# Patient Record
Sex: Male | Born: 1945
Health system: Southern US, Community
[De-identification: ages and names within clinical notes are randomized; demographics above are authoritative.]

## PROBLEM LIST (undated history)

## (undated) DIAGNOSIS — G629 Polyneuropathy, unspecified: Secondary | ICD-10-CM

## (undated) DIAGNOSIS — E785 Hyperlipidemia, unspecified: Secondary | ICD-10-CM

## (undated) DIAGNOSIS — B181 Chronic viral hepatitis B without delta-agent: Secondary | ICD-10-CM

## (undated) DIAGNOSIS — N189 Chronic kidney disease, unspecified: Secondary | ICD-10-CM

## (undated) DIAGNOSIS — I2510 Atherosclerotic heart disease of native coronary artery without angina pectoris: Secondary | ICD-10-CM

## (undated) DIAGNOSIS — J449 Chronic obstructive pulmonary disease, unspecified: Secondary | ICD-10-CM

## (undated) DIAGNOSIS — I219 Acute myocardial infarction, unspecified: Secondary | ICD-10-CM

## (undated) DIAGNOSIS — G4733 Obstructive sleep apnea (adult) (pediatric): Secondary | ICD-10-CM

## (undated) DIAGNOSIS — E119 Type 2 diabetes mellitus without complications: Secondary | ICD-10-CM

## (undated) DIAGNOSIS — I10 Essential (primary) hypertension: Secondary | ICD-10-CM

## (undated) HISTORY — DX: Obstructive sleep apnea (adult) (pediatric): G47.33

## (undated) HISTORY — DX: Essential (primary) hypertension: I10

## (undated) HISTORY — DX: Acute myocardial infarction, unspecified: I21.9

## (undated) HISTORY — DX: Chronic viral hepatitis B without delta-agent: B18.1

## (undated) HISTORY — DX: Hyperlipidemia, unspecified: E78.5

## (undated) HISTORY — PX: ABDOMINAL AORTIC ANEURYSM REPAIR: SUR1152

## (undated) HISTORY — PX: LUNG SURGERY: SHX703

## (undated) HISTORY — DX: Atherosclerotic heart disease of native coronary artery without angina pectoris: I25.10

## (undated) HISTORY — DX: Chronic kidney disease, unspecified: N18.9

## (undated) HISTORY — DX: Polyneuropathy, unspecified: G62.9

## (undated) HISTORY — DX: Type 2 diabetes mellitus without complications: E11.9

## (undated) HISTORY — DX: Chronic obstructive pulmonary disease, unspecified: J44.9

---

## 1995-07-15 HISTORY — PX: LUMBAR DISC SURGERY: SHX700

## 2000-05-26 ENCOUNTER — Ambulatory Visit (HOSPITAL_COMMUNITY): Admission: RE | Admit: 2000-05-26 | Discharge: 2000-05-27 | Payer: Self-pay | Admitting: Interventional Cardiology

## 2000-05-27 ENCOUNTER — Encounter: Payer: Self-pay | Admitting: Interventional Cardiology

## 2000-06-24 ENCOUNTER — Encounter: Payer: Self-pay | Admitting: Thoracic Surgery

## 2000-06-26 ENCOUNTER — Encounter: Payer: Self-pay | Admitting: Thoracic Surgery

## 2000-06-26 ENCOUNTER — Inpatient Hospital Stay (HOSPITAL_COMMUNITY): Admission: RE | Admit: 2000-06-26 | Discharge: 2000-06-30 | Payer: Self-pay | Admitting: Thoracic Surgery

## 2000-06-26 ENCOUNTER — Encounter (INDEPENDENT_AMBULATORY_CARE_PROVIDER_SITE_OTHER): Payer: Self-pay

## 2000-06-27 ENCOUNTER — Encounter: Payer: Self-pay | Admitting: Thoracic Surgery

## 2000-06-28 ENCOUNTER — Encounter: Payer: Self-pay | Admitting: Thoracic Surgery

## 2000-06-29 ENCOUNTER — Encounter: Payer: Self-pay | Admitting: Thoracic Surgery

## 2000-07-12 ENCOUNTER — Emergency Department (HOSPITAL_COMMUNITY): Admission: EM | Admit: 2000-07-12 | Discharge: 2000-07-12 | Payer: Self-pay | Admitting: *Deleted

## 2000-07-21 ENCOUNTER — Encounter: Payer: Self-pay | Admitting: Thoracic Surgery

## 2000-07-21 ENCOUNTER — Encounter: Admission: RE | Admit: 2000-07-21 | Discharge: 2000-07-21 | Payer: Self-pay | Admitting: Thoracic Surgery

## 2000-09-01 ENCOUNTER — Encounter: Admission: RE | Admit: 2000-09-01 | Discharge: 2000-09-01 | Payer: Self-pay | Admitting: Thoracic Surgery

## 2000-09-01 ENCOUNTER — Encounter: Payer: Self-pay | Admitting: Thoracic Surgery

## 2000-11-04 ENCOUNTER — Encounter: Admission: RE | Admit: 2000-11-04 | Discharge: 2000-11-04 | Payer: Self-pay | Admitting: Thoracic Surgery

## 2000-11-04 ENCOUNTER — Encounter: Payer: Self-pay | Admitting: Thoracic Surgery

## 2001-03-05 ENCOUNTER — Encounter: Admission: RE | Admit: 2001-03-05 | Discharge: 2001-03-05 | Payer: Self-pay | Admitting: Thoracic Surgery

## 2001-03-05 ENCOUNTER — Encounter: Payer: Self-pay | Admitting: Thoracic Surgery

## 2007-07-09 ENCOUNTER — Inpatient Hospital Stay (HOSPITAL_COMMUNITY): Admission: EM | Admit: 2007-07-09 | Discharge: 2007-07-10 | Payer: Self-pay | Admitting: Emergency Medicine

## 2007-07-16 ENCOUNTER — Ambulatory Visit (HOSPITAL_COMMUNITY): Admission: RE | Admit: 2007-07-16 | Discharge: 2007-07-17 | Payer: Self-pay | Admitting: Interventional Cardiology

## 2007-07-17 ENCOUNTER — Emergency Department (HOSPITAL_COMMUNITY): Admission: EM | Admit: 2007-07-17 | Discharge: 2007-07-17 | Payer: Self-pay | Admitting: Emergency Medicine

## 2007-07-18 ENCOUNTER — Emergency Department (HOSPITAL_COMMUNITY): Admission: EM | Admit: 2007-07-18 | Discharge: 2007-07-18 | Payer: Self-pay | Admitting: Emergency Medicine

## 2008-07-04 ENCOUNTER — Emergency Department (HOSPITAL_COMMUNITY): Admission: EM | Admit: 2008-07-04 | Discharge: 2008-07-04 | Payer: Self-pay | Admitting: Emergency Medicine

## 2009-11-13 IMAGING — CR DG RIBS W/ CHEST 3+V*L*
3 series · 3 of 3 positions shown · non-contrast
Comparison: Chest x-ray 07/09/2007

CLINICAL DATA: Fell on ice - left chest and shoulder pain

LEFT RIBS AND CHEST - 3+ VIEW

[w chest pa]
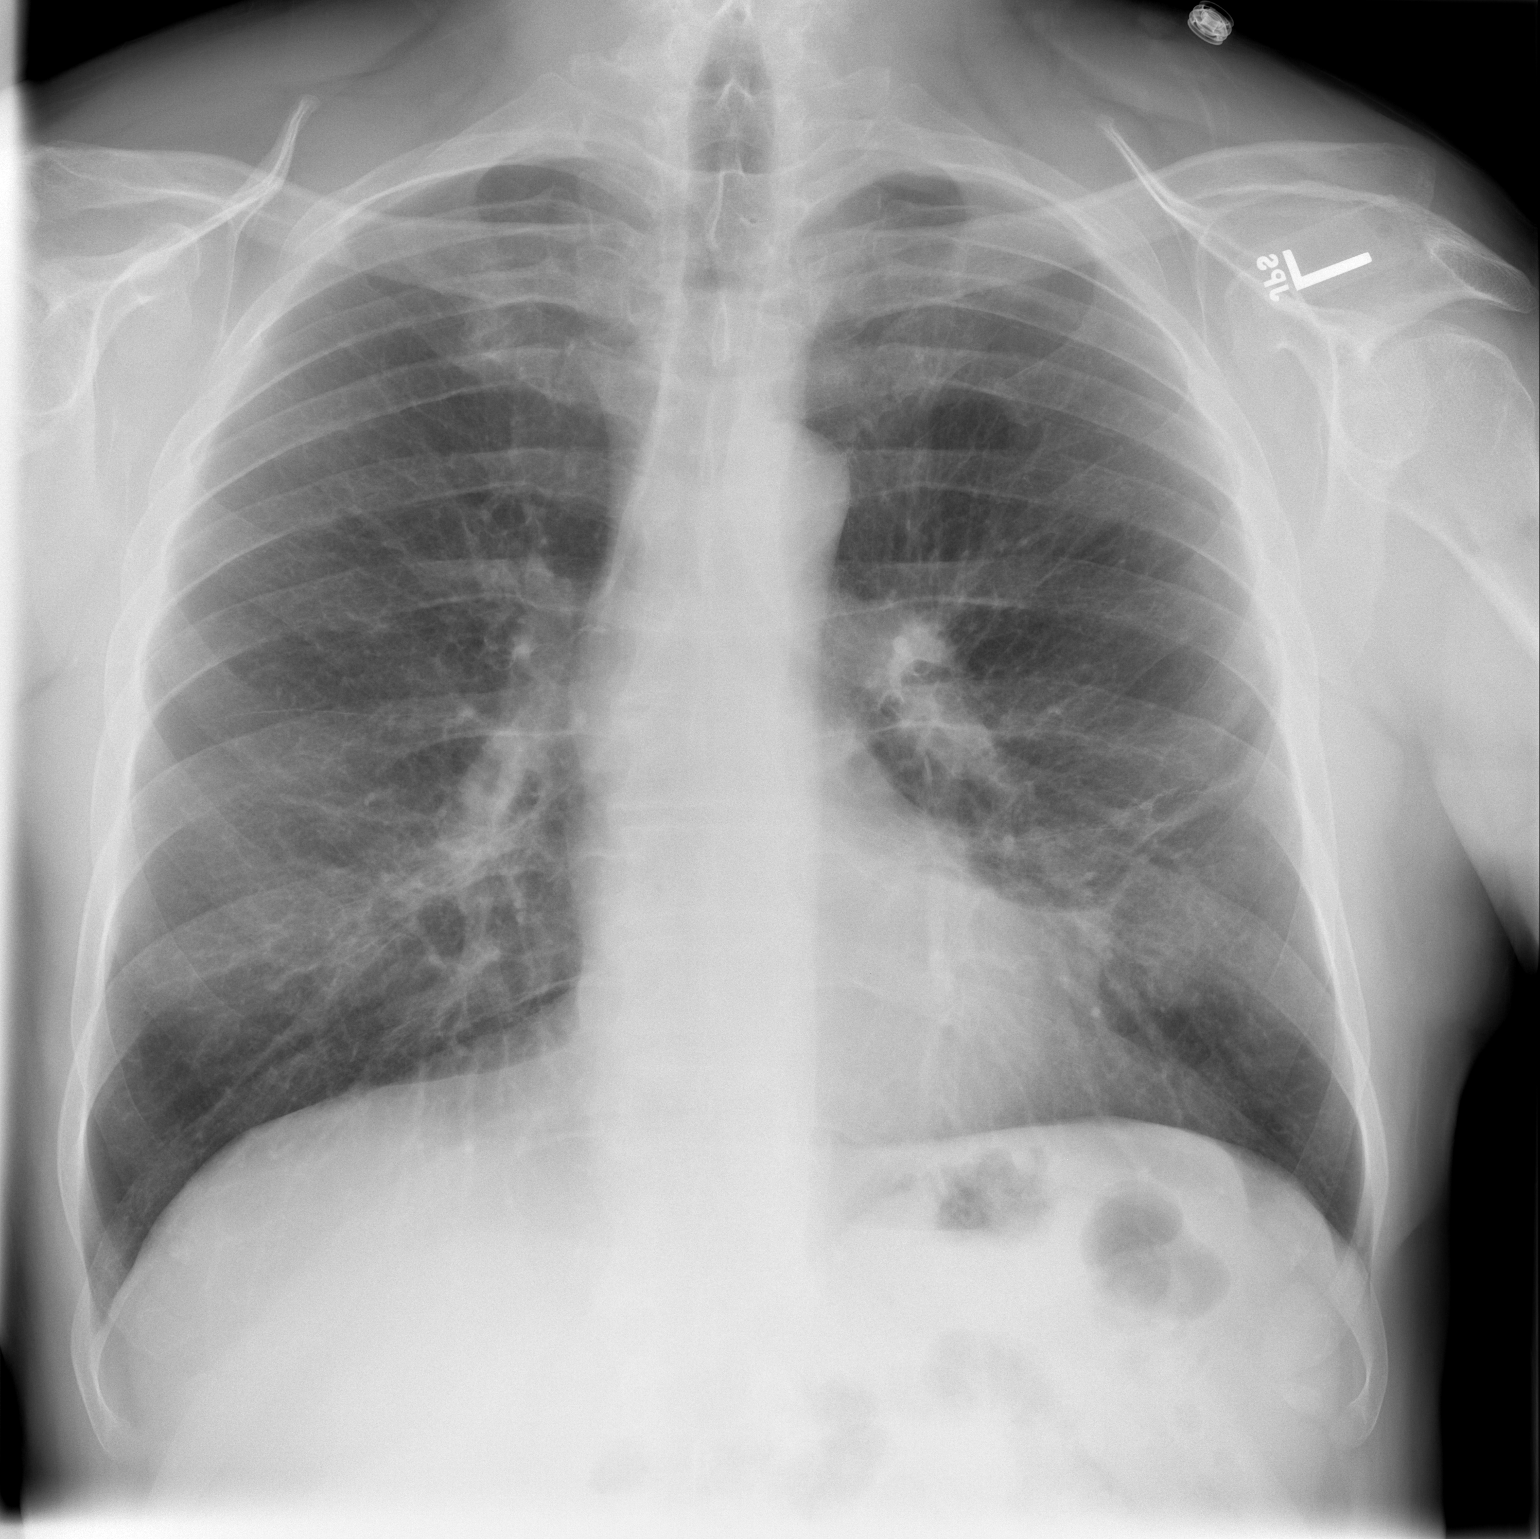

[w ribs ap/pa upper left *]
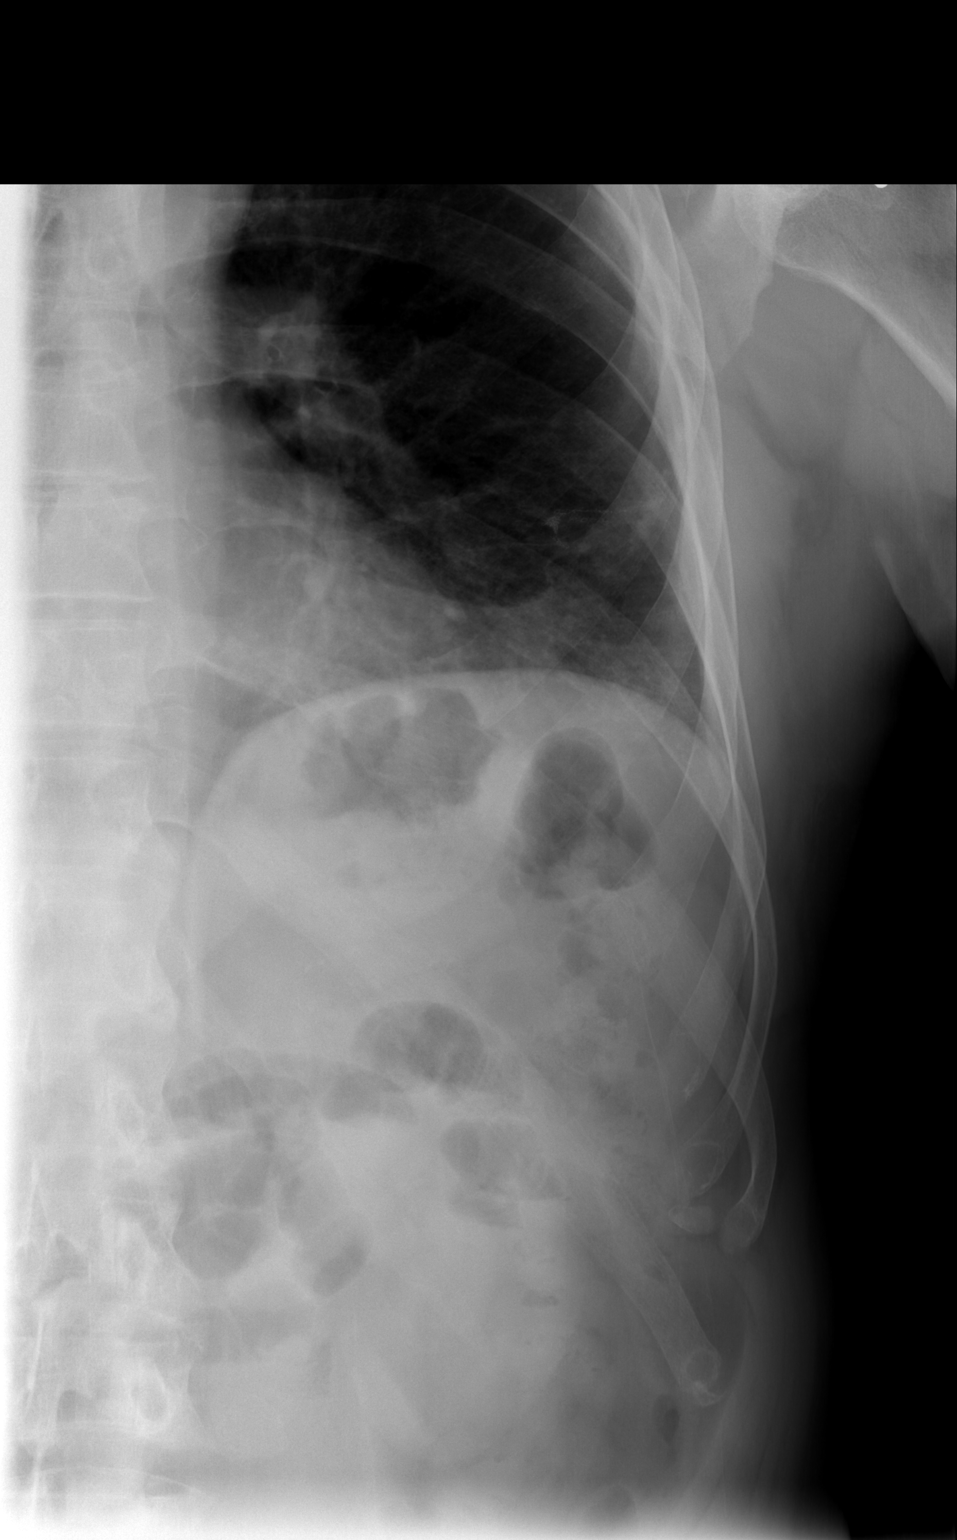

[w ribs ap/pa lower left *]
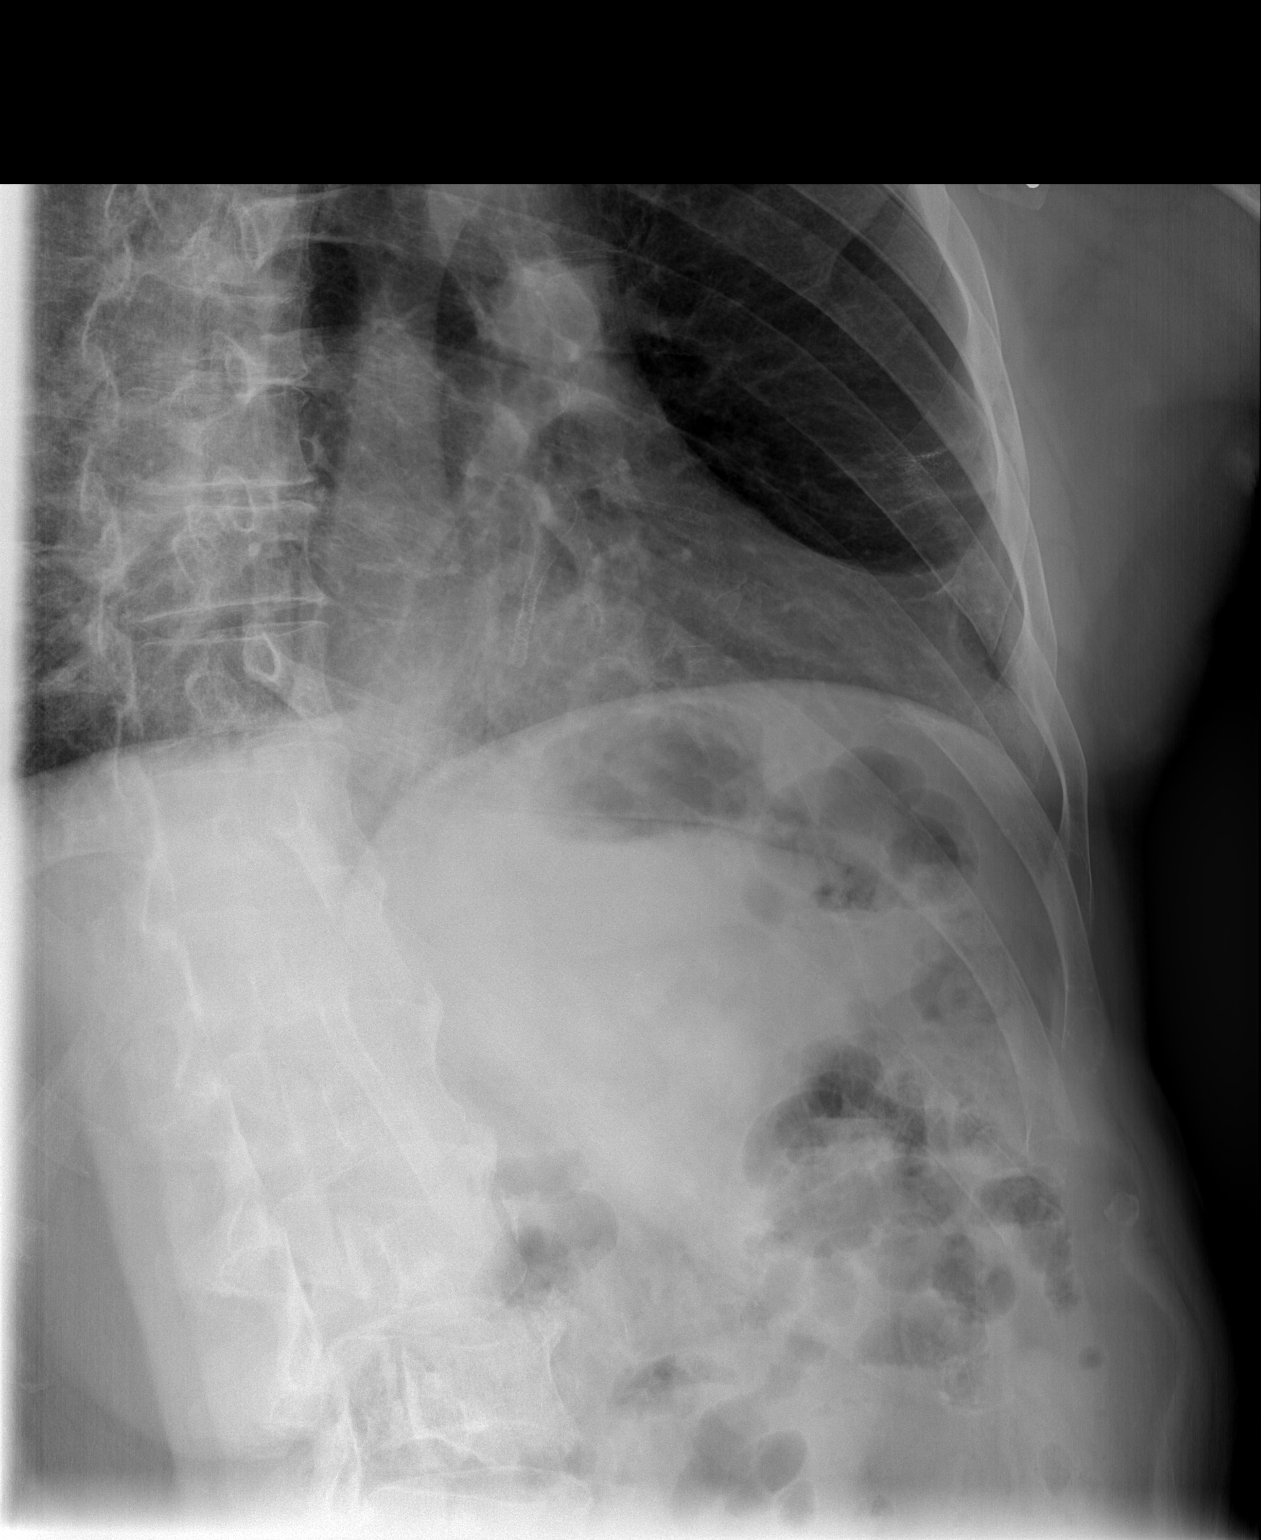

[3 of 3 positions shown; findings below may reference images not displayed]

FINDINGS: There are no visible rib fractures.  No pneumothorax or
pleural fluid.

There are chronic lingular scar like changes and rather heavy
bronchitic changes as noted previously.  Coronary arteries stents
are seen as well.
IMPRESSION: 1.  No rib fractures or acute findings.
2.  Lingular scarring and chronic bronchitic changes.
3.  Coronary arteries stents.

## 2009-11-13 IMAGING — CT CT HEAD W/O CM
1 series · 16 of 30 positions shown, 20 images · non-contrast
Comparison: None

CLINICAL DATA: Fell on ice.

CT HEAD WITHOUT CONTRAST
TECHNIQUE: Contiguous axial images were obtained from the base of
the skull through the vertex without contrast.

[Series 2: head trauma 4.8 h37s · axial · 0.51mm/px · z∈[+1338,+1498]mm · 16 of 36 slices shown, 20 images]
[im 2/36  brain]
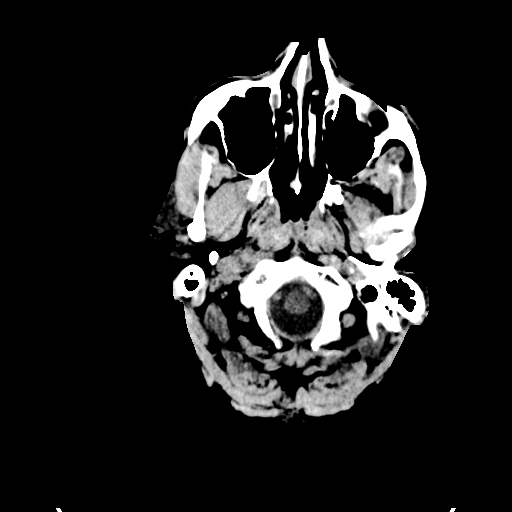
[im 2/36  bone]
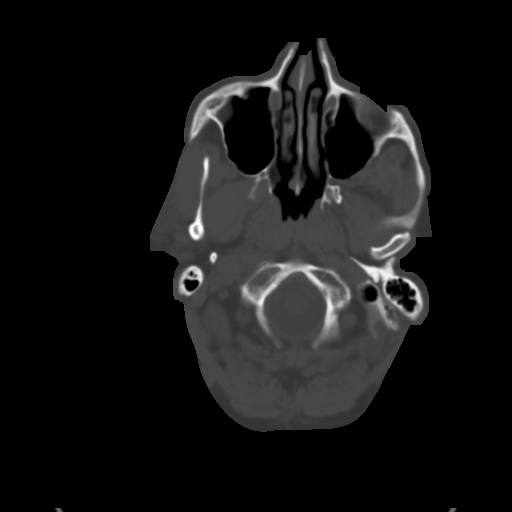
[im 4/36  brain]
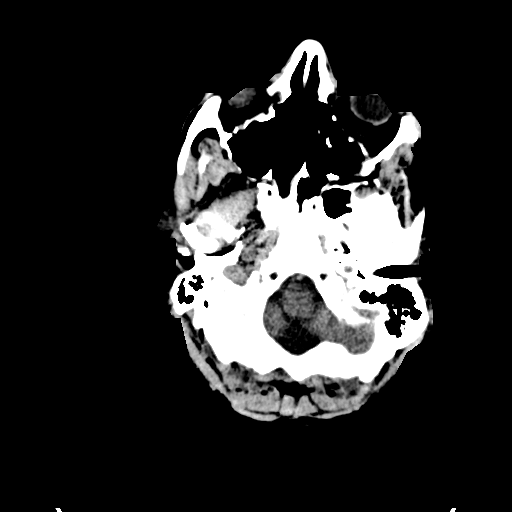
[im 7/36  brain]
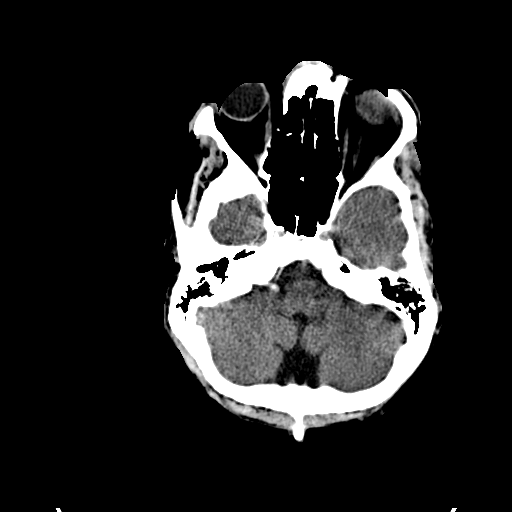
[im 9/36  brain]
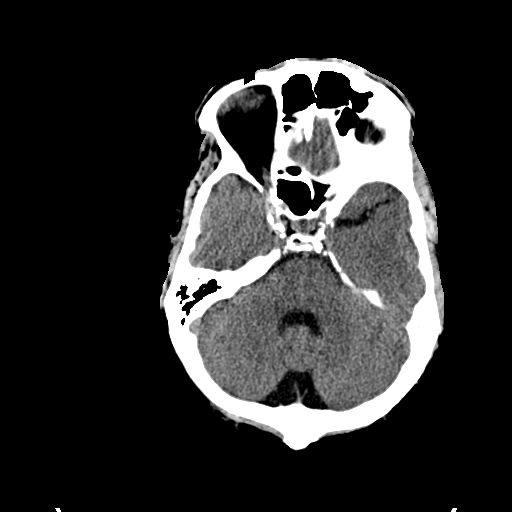
[im 10/36  brain]
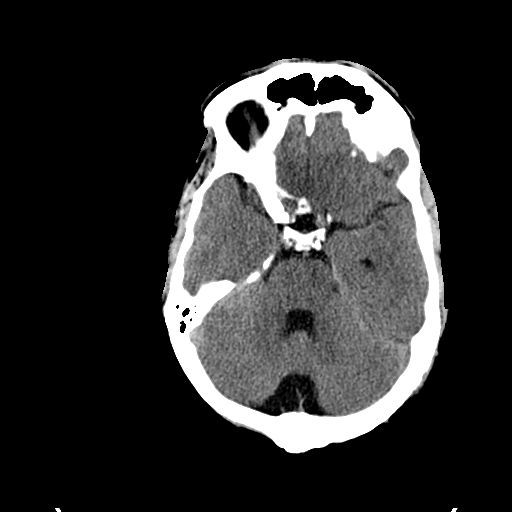
[im 10/36  bone]
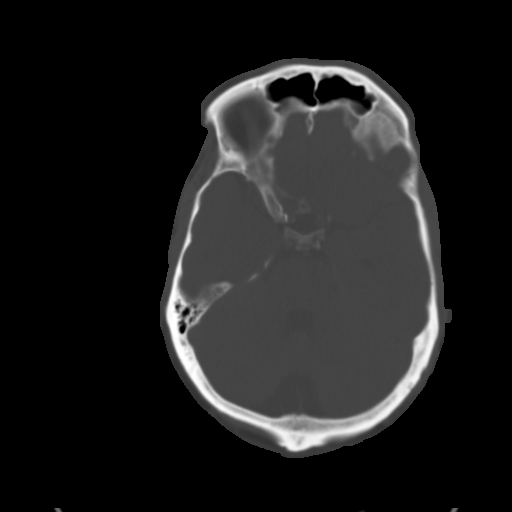
[im 13/36  brain]
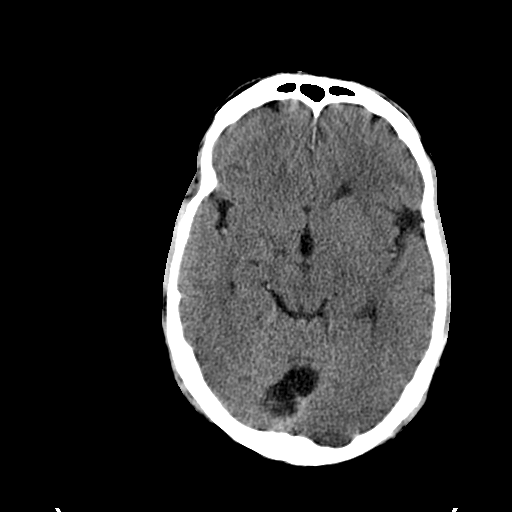
[im 15/36  brain]
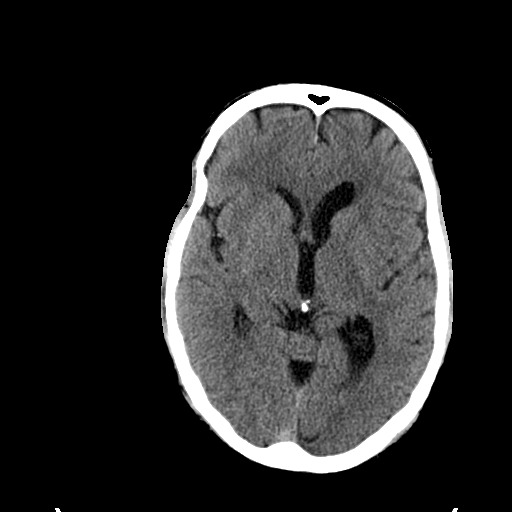
[im 17/36  brain]
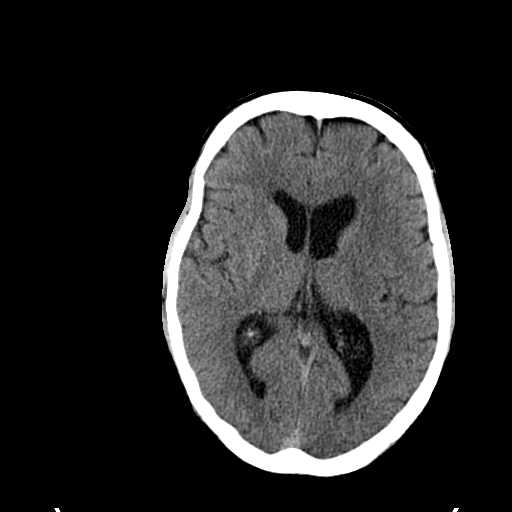
[im 19/36  brain]
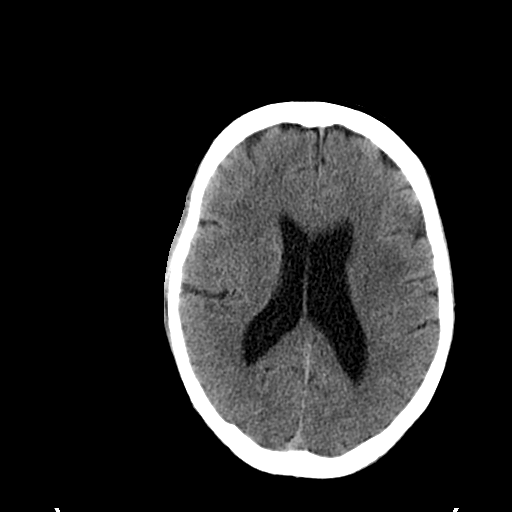
[im 19/36  bone]
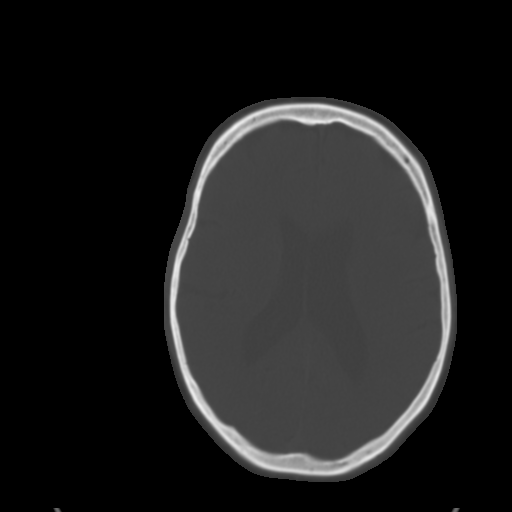
[im 21/36  brain]
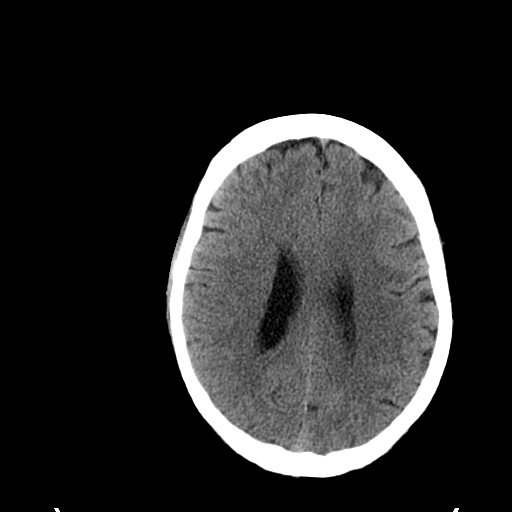
[im 23/36  brain]
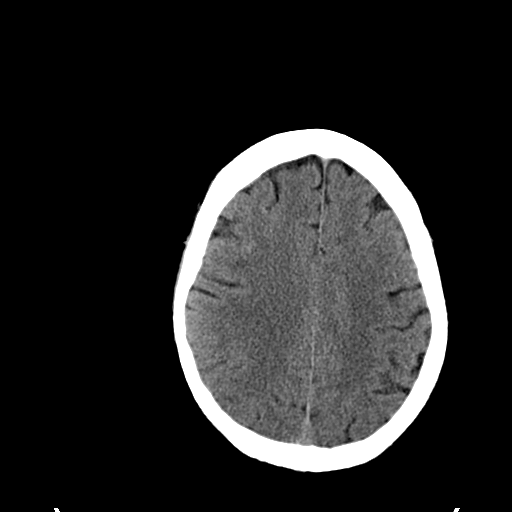
[im 26/36  brain]
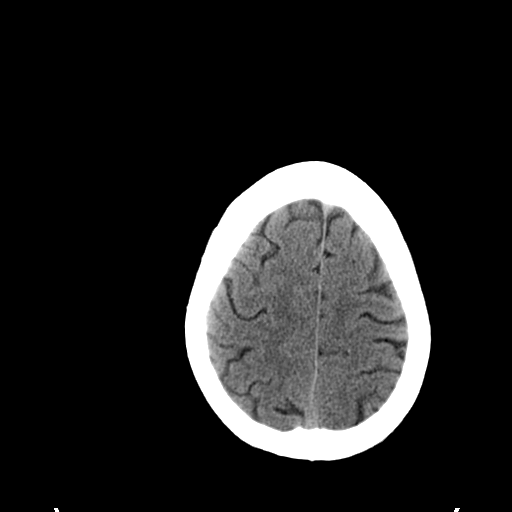
[im 27/36  brain]
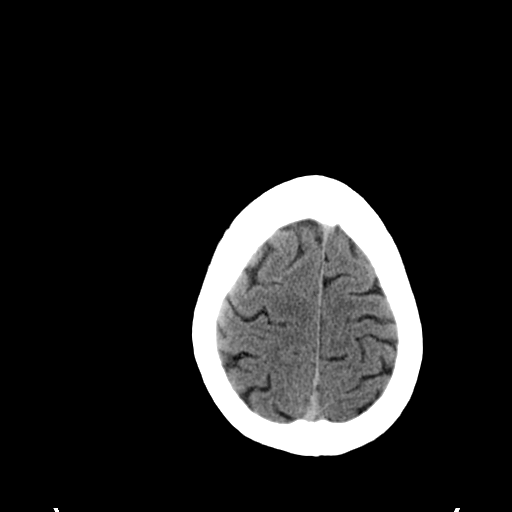
[im 27/36  bone]
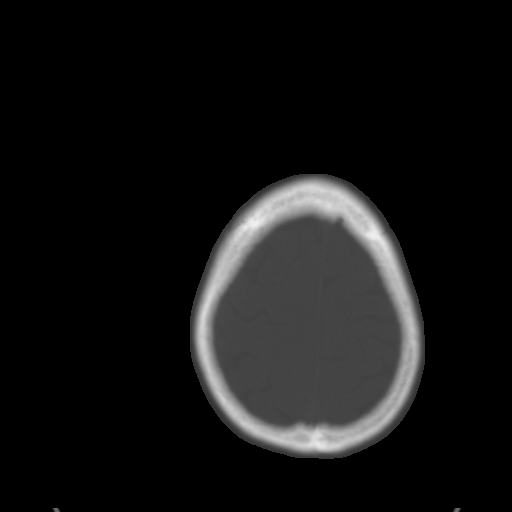
[im 29/36  brain]
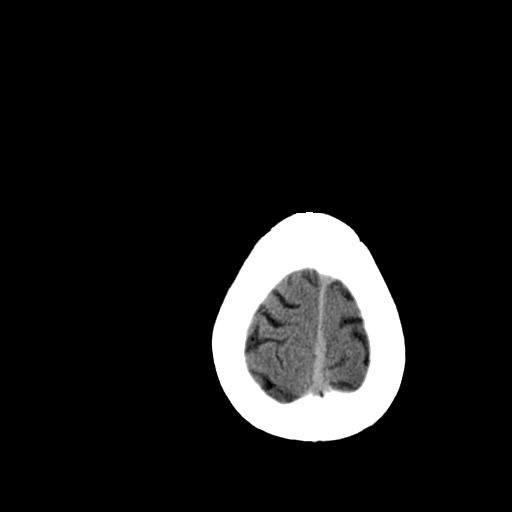
[im 32/36  brain]
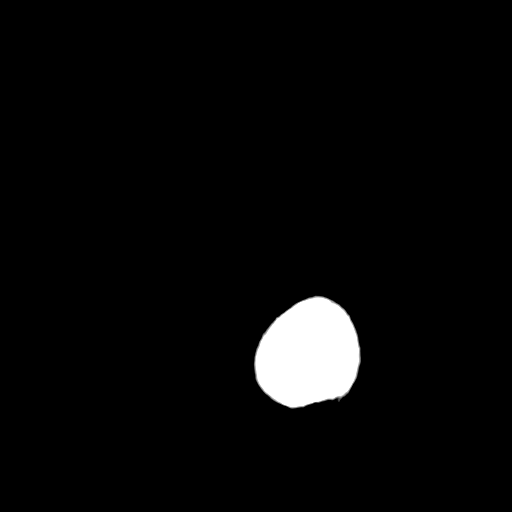
[im 34/36  brain]
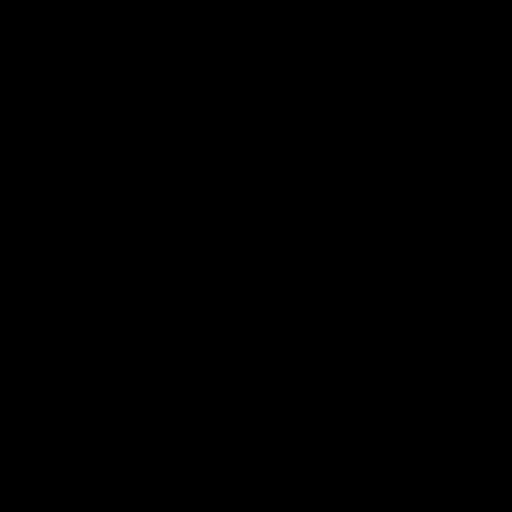

[16 of 30 positions shown; findings below may reference images not displayed]

FINDINGS: Ventricles are normal in size.  Mega cisterna magna is
noted as an incidental finding.  Negative for intracranial
hemorrhage.  There is no mass lesion.  There is patchy hypodensity
in the deep white matter bilaterally compatible with chronic
microvascular ischemia.  Negative for skull fracture.
IMPRESSION: No acute abnormality.

## 2010-04-23 ENCOUNTER — Ambulatory Visit: Payer: Self-pay | Admitting: Cardiovascular Disease

## 2010-11-26 NOTE — H&P (Signed)
NAME:  Samuel Watkins, Samuel NO.:  [id number]   MEDICAL RECORD NO.:  [id number]          PATIENT TYPE:  EMS   LOCATION:  MAJO                         FACILITY:  [facility]   PHYSICIAN:  Jake Bathe, MD      DATE OF BIRTH:  [birth year]   DATE OF ADMISSION:  07/09/2007  DATE OF DISCHARGE:                              HISTORY & PHYSICAL   PRIMARY CARDIOLOGIST:  Lyn Records, M.D.   PRIMARY CARE PHYSICIAN:  Tasia Catchings, M.D.   GASTROENTEROLOGIST:  Danise Edge, M.D.   CHIEF COMPLAINT:  Chest pain.   HISTORY OF PRESENT ILLNESS:  65 year old male with coronary artery  disease status post PCI BMS to RCA (occlusion) in 2001 with residual  moderate diffuse disease in the proximal and mid-right coronary artery,  moderate LAD disease with a mid 60 to 70% stenosis and 70% stenosis of  the first obtuse marginal branch of the circumflex artery who presents  today with worsening angina.  Since Monday, he has been noticing  increasing chest discomfort lasting approximately 5 to 10 minutes in  duration.  Occurred once while with exertion while bringing his trash in  from outside, substernal with shortness of breath,  radiation to  bilateral arms.  He had another episode during sexual activity.  He is  currently not having any rest pain. Wife was worried after another bout  of chest discomfort this morning and he promptly came over to [facility]  Emergency Department for evaluation.  Currently not complaining of any  significant chest pain, shortness of breath, nausea, and vomiting.  Has  had prior easy bruising.  Has not been on aspirin or any other  medicines.  Still continues to smoke quite heavily.   PAST MEDICAL HISTORY:  1. Coronary artery disease PCI to RCA in 2001 (PENTA BMS 3.0 x 23mm)      with moderate disease in LAD and circumflex.  2. Hepatitis B.  3. Hyperlipidemia.  4. Tobacco abuse.   ALLERGIES:  No known drug allergies.   MEDICINES:  None currently.  No  ASA. Here received aspirin, oxygen, and  IV nitroglycerin.   SOCIAL HISTORY:  He smokes one pack per day.  Occasional alcohol use.  Denies any illicit drug use.  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  Has not been taking any medicines or aspirin.   FAMILY HISTORY:  Both parents have had coronary artery disease.  No  early family history of myocardial infarction.   REVIEW OF SYSTEMS:  Easy bruising, hepatitis, arthritis, smoking.  No  syncope.  No orthopnea or PND.  Unless specified above, all other 12  review of systems negative.   PHYSICAL EXAMINATION:  VITAL SIGNS:  Temperature 97.0, blood pressure  136/83, pulse 57, respirations 18, and sating 96% on room air.  GENERAL:  Alert and oriented x3, in no acute distress, here with his  wife, lying comfortably in bed.  EYES:  Well-perfused conjunctivae.  EOMI.  No scleral icterus.  HEENT:  Moist  mucus membranes.  NECK:  Supple.  No thyromegaly.  No lymphadenopathy.  No carotid bruits.  CARDIOVASCULAR:  Bradycardiac, regular rhythm with no appreciable  murmurs, rubs, or gallops.  Normal PMI.  LUNGS:  Clear to auscultation bilaterally.  No rales.  Normal  respiratory effort.  ABDOMEN:  Soft, nontender, and normal active bowel sounds.  No bruits.  EXTREMITIES:  No clubbing, cyanosis, or edema, 2+ distal pulses, and 2+  femoral pulses bilaterally.  NEUROLOGIC:  Nonfocal and no tremors.  SKIN:  Warm, dry, and intact.  No rashes.   LABORATORY DATA:  EKG:  Sinus bradycardia, rate 54, normal intervals,  and right axis deviation.  Compared to prior ECG 2001, there was no  significant change.  There are no ST deviations.  Sodium 138, potassium  4.6, BUN 19, creatinine 1.1, glucose 84, hemoglobin 15.6, and hematocrit  46.  First set of cardiac biomarkers are negative.   Chest x-ray personally reviewed shows some scarring in the left lingula;  otherwise, unremarkable  (he has had prior lung nodules followed up in  the past with CT scanning by Dr. Theresia Lo).  Last nuclear stress test in  May 2002 showed no infarct or ischemia with a normal ejection fraction  of 61%.  Prior catheterization 2001 as described above.   ASSESSMENT AND PLAN:  A 65 year old male with coronary artery disease  status post prior percutaneous coronary intervention to right coronary  artery total occlusion with moderate disease in the left anterior  descending and circumflex, with hyperlipidemia, tobacco abuse with  unstable angina.  1. Unstable angina- Given the patient's increased frequency of angina      or chest discomfort, I will perform cardiac catheterization to      assess stent and other cardiac anatomy.  Risks and benefits of the      procedure have been explained including stroke, heart attack, and      death.  His wife was present for the discussion.  Continue aspirin.      We will place on low dose nitroglycerin drip.  No beta-blocker      currently given bradycardia.  We will check fasting lipid profile      on the morning, assess for statin use (prior hepatitis).  Continue      cyclo cardiac biomarkers.  2. Tobacco abuse - Counseled on tobacco cessation.  3. Hyperlipidemia - Check fasting lipid profile, questionable need for      statin use given prior history of hepatitis.  4. Hepatitis B - Followed by Dr. Laural Benes, GI.      Jake Bathe, MD  Electronically Signed     MCS/MEDQ  D:  07/09/2007  T:  07/09/2007  Job:  [id number]   cc:   Vikki Ports, M.D.  Danise Edge, M.D.  Lyn Records, M.D.

## 2010-11-26 NOTE — Cardiovascular Report (Signed)
Samuel Watkins, Samuel Watkins NO.:  [id number]   MEDICAL RECORD NO.:  [id number]          PATIENT TYPE:  INP   LOCATION:  3743                         FACILITY:  [facility]   PHYSICIAN:  Corky Crafts, MDDATE OF BIRTH:  January 24, 1946   DATE OF PROCEDURE:  07/09/2007  DATE OF DISCHARGE:                            CARDIAC CATHETERIZATION   PROCEDURE PERFORMED:  Percutaneous coronary intervention of the right  coronary artery.   OPERATOR:  Corky Crafts, MD   INDICATIONS:  Unstable angina.   PROCEDURE:  The diagnostic catheterization was performed by [name]  and revealed 99% RCA lesion and stent.  This was thought to be the  culprit for his symptoms.  A JR-4 guide was used.  Prowater wire was  used to cross the lesion.  A 2.5 x 15-mm Maverick was placed across the  lesion and inflated to 8 atmospheres x23 seconds and then more  proximally at 8 atmospheres x8 seconds.  Sluggish flow was seen in the  vessel.  A fetch catheter was used to try and aspirate thrombus, but  this was not successful.  A 3.0 x 32-mm, Taxus Liberte stent was then  placed across the lesion and deployed at 14 atmospheres x24 seconds.  Subsequently, 200 mcg of verapamil was administered to the patient.  Flow was back to normal.  A 3.5 x 20-mm, Quantum balloon was then placed  within the distal portion of the stent and inflated to 60 atmospheres  x33 seconds and then more proximally to 18 atmospheres x26 seconds.  There was an excellent angiographic result.  There is TIMI-3 flow.  There was no evidence of distal embolization.  There was some transient  spasm at the distal stent edge.  This resolve with 200 mcg of  nitroglycerin.   IMPRESSION:  Successful percutaneous coronary intervention of the right  coronary artery with a 3.0 x 32-mm, Taxus Liberte stent.   RECOMMENDATIONS:  The patient will be watched overnight pain.  His  Integrilin can be stopped if he has any bleeding problems,  otherwise it  will continued for 18 hours.  He should continue on aspirin and Plavix  for at least a year.  This was explained to the patient prior to the  procedure and he is willing to comply.  He also has some LAD disease  which can likely be addressed at a later time if he fails medical  therapy.      Corky Crafts, MD  Electronically Signed     JSV/MEDQ  D:  07/09/2007  T:  07/09/2007  Job:  [id number]

## 2010-11-26 NOTE — Cardiovascular Report (Signed)
NAMEANITA, MCADORY NO.:  [id number]   MEDICAL RECORD NO.:  [id number]          PATIENT TYPE:  INP   LOCATION:  2807                         FACILITY:  [facility]   PHYSICIAN:  Jake Bathe, MD      DATE OF BIRTH:  June 21, 1946   DATE OF PROCEDURE:  DATE OF DISCHARGE:                            CARDIAC CATHETERIZATION   PROCEDURE:  1. Left heart catheterization.  2. Left ventriculogram.  3. Selective coronary angiography.  4. Right femoral angiography.   PRIMARY CARDIOLOGIST:  Lyn Records, M.D.   PRIMARY CARE PHYSICIAN:  Vikki Ports, M.D.   GASTROENTEROLOGIST:  Danise Edge, M.D.   PROCEDURE IN DETAIL:  Informed consent was obtained.  Risks including  stroke, death, and myocardial infarction were explained to the patient  and his wife.  He was draped in a sterile fashion and placed on the  catheterization table; 1% lidocaine was used to infiltrate the right  groin.  A 5-French sheath was used to cannulate the right femoral  artery.  Under fluoroscopic guidance, Judkins left #4 catheter was  selectively placed in the left main artery.  Multiple views with hand  injection were obtained.  This catheter was exchanged for a Judkins  right #4 catheter, which was used to selectively cannulate the right  coronary artery.  Multiple views with hand injection were obtained.  As  of note, during injection of left main artery, transient ST elevations  were noted.  He did not complain of any chest pain.  The Judkins right  was then exchanged for an angled pigtail, which was used to cross the  aortic valve and placed in the left ventricle.  Hemodynamics were  obtained.  In the RAO projection, a left ventriculogram with power  injection was used with 36 mL of contrast.  Following the procedure, the  findings were discussed with Dr. Eldridge Dace and plan PCI for right  coronary artery in-stent stenosis of 99% was planned.  The 5-French  sheath was then exchanged for a  6-French sheath and a femoral angiogram  was performed to ensure proper positioning.   FINDINGS:  1. Left main - no significant disease.  2. Left anterior descending artery - there is 70-80% stenosis just      distal to the first diagonal branch, which is slightly worse than      from previous catheterization in 2001.  The LAD then continues to      wrap around the apex.  The first diagonal branch has 70% proximal      stenosis.  Small caliber vessel.  3. Ramus - there is a large ramus branch with moderate diffuse disease      (2- to 2.5-mm vessel) branching.  4. Circumflex artery - This is a small caliber vessel.  There is no      significant change from 2001.  The first obtuse marginal branch has      approximately 70% proximal stenosis.  5. Right coronary artery - There is 99% in-stent stenosis (prior stent      in 2001 was a PENTA 3.0 x  23 mm stent).  In the proximal artery      prior to stent, there is 30-40% stenosis.  Right coronary artery is      the dominant vessel.  There is some diffuse mild coronary disease      distal to the PDA and in the distal PL branches.  Large system.   HEMODYNAMICS:  Left ventricle 134 systolic with an LVEDP of 12 mmHg.  Aorta 132/66 with a mean of 91 mmHg.  There is no significant gradient  between the left ventricle and proximal aorta.   LEFT VENTRICULOGRAM:  LV function is normal with estimated left  ventricular ejection fraction of 60%.  There were no regional wall  motion abnormalities.   IMPRESSIONS:  1. 99% in-stent stenosis of the mid right coronary artery previously      placed stent from 2001. 30-40% stenosis proximal to stent in      proximal RCA.  2. 70-80% stenosis of the mid left anterior descending just distal to      the first diagonal. See above for further details.  3. Normal left ventricular ejection fraction with estimated ejection      fraction of 60% with no regional wall motion abnormalities.   RECOMMENDATIONS:   Findings discussed with Dr. Eldridge Dace who will plan PCI  to right coronary artery.  It is not unreasonable to monitor him  clinically with aggressive medical management and if chest pain returns,  to consider intervening to the mid LAD artery.  Long discussion with the  patient over importance of Plavix was held on the cath table.  Findings  were discussed with both patient and his wife.      Jake Bathe, MD  Electronically Signed     MCS/MEDQ  D:  07/09/2007  T:  07/09/2007  Job:  [id number]   cc:   Lyn Records, M.D.

## 2010-11-26 NOTE — Cardiovascular Report (Signed)
NAME:  Samuel Watkins, Samuel Watkins NO.:  [id number]   MEDICAL RECORD NO.:  [id number]          PATIENT TYPE:  INP   LOCATION:  6532                         FACILITY:  [facility]   PHYSICIAN:  Lyn Records, M.D.   DATE OF BIRTH:  March 31, 1946   DATE OF PROCEDURE:  DATE OF DISCHARGE:                            CARDIAC CATHETERIZATION   INDICATION FOR THIS PROCEDURE:  High-grade mid LAD disease documented by  cath performed on July 09, 2007.  Also, a high-grade 95% moderate-  sized diagonal #1.   PROCEDURE PERFORMED:  1. Right coronary angiography.  2. Left coronary percutaneous intervention with T stent in the mid LAD      and cutting balloon angioplasty of the first diagonal.  3. Angio-Seal of right femoral cath site.   DESCRIPTION:  After informed consent, the patient was brought to the  cath lab.  He has been on Plavix for greater than a week.  He received a  loading dose of 300 mg in the holding area along with 324 mg of chewable  aspirin.   He was given 2 mg of IV Versed and 15 mcg for fentanyl for sedation and  analgesia.  We then placed a 6-French arterial sheath in the right  femoral artery using a modified Seldinger technique after 1% Xylocaine  local infiltration.   We performed right coronary angiography to document the patency of the  recently stented right coronary artery.  This was also widely patent.  The distal flow was normal and TIMI grade 3.   We then turned our attention to the left coronary artery.  We used the  CLS 3.5 6-French guide catheter.  We obtained guiding shots.  The  patient received a bolus of 0.75 mg/kg of Angiomax followed by an  infusion of 1.75 mg/kg per hour.  Head CT was documented  to be greater  than 300.  Using ASAHI Pro water guidewire loaded into the guide  catheter, we predilated the LAD with two 7 x 15 mm long Maverick.  We  then deployed a 23 mm long x 3.0 mm diameter PROMUS stent at 10  atmospheres.  Two balloon inflations  were performed.  We then pulse-  dilated with a 3.0 x 15 Dura Star balloon to 15 atmospheres proximally  and distally.  TIMI grade 3 flow was noted postdeployement with a  significant step up from the distal vessel, but adequate sizing  proximally.   We then turned our attention to the first diagonal, which contained a  focal 95% stenosis.  Because of the relatively small vessel diameter, we  performed cutting balloon angioplasty using a 2.25 x 6 mm long cutting  balloon.  Two inflations each were performed at 8 atmospheres.  A very  nice angiography result was obtained.  TIMI grade 3 flow was noted.   Angio-Seal was used for hemostasis with good results.   CONCLUSIONS:  1. Successful stent of the mid LAD from 90% to 0% with TIMI grade 3      flow using a PROMUS drug-eluting stent.  2. Successful cutting balloon angioplasty on 95%  proximal, focal,      diagonal stenosis reduced to 0% with TIMI grade 3 flow #3,      successful Angio-Seal.   PLAN:  Clinical management at home, and an aspirin and Plavix for a  year.  Angiomax is discontinued at the conclusion of the procedure.      Lyn Records, M.D.  Electronically Signed     HWS/MEDQ  D:  07/16/2007  T:  07/16/2007  Job:  [id number]   cc:   Vikki Ports, M.D.  Corky Crafts, MD

## 2010-11-29 NOTE — Discharge Summary (Signed)
[place]  Patient:    Samuel Watkins, COPPA                       MRN: [id number] Adm. Date:  [id number] Disc. Date: [id number] Attending:  Cameron Proud Dictator:   Carlye Grippe CC:         Algis Downs Karle Plumber, M.D., CVTS office  Vikki Ports, M.D., [address] [place], [place],  [place]  [address]   Discharge Summary  ADDENDUM:  I failed to mention on the previously dictated discharge summary that Mr. Volden did undergo PTCA and stenting of the right coronary artery earlier this month at the time of cardiac catheterization by Dr. Darci Needle.  ADDITIONAL DIAGNOSIS ASSOCIATED WITH THIS PATIENT:  Coronary artery disease.  ADDITIONAL CHANGE TO DISCHARGE MEDICATIONS:  The patient will continue his aspirin as previously which was not dictate as part of his discharge medications.  It will be 325 mg daily. DD:  06/30/00 TD:  06/30/00 Job: [id number]

## 2010-11-29 NOTE — Cardiovascular Report (Signed)
[place]  Patient:    Samuel Watkins, Samuel Watkins                       MRN: [id number] Proc. Date: 05/26/00 Adm. Date:  [id number] Attending:  Lyn Records. Iii CC:         Vikki Ports, M.D.  Cardiac Catheterization Laboratory   Cardiac Catheterization  PROCEDURE: 1. Left heart catheterization. 2. Selective coronary angiography. 3. Left ventriculography. 4. Percutaneous transluminal coronary angioplasty and stent of the    right coronary artery.  CARDIOLOGIST:  Celso Sickle, M.D.  INDICATIONS:  Progressive angina pectoris over three to four weeks, in a heavy smoker, with a positive family history for coronary artery disease.  DESCRIPTION OF PROCEDURE:  After an informed consent, a 6-French sheath was inserted into the right femoral artery using the modified Seldinger technique. A 6-French A2 multipurpose catheter was used for hemodynamic recordings, left ventriculography, and selective left and right coronary angiography.  The patient tolerated the procedure without complications.  After a review of the digital display, it was felt that attempt at percutaneous recanalization of the right coronary artery, which was totally occluded was indicated.  We performed an angioplasty initially using a BMW wire.  We subsequently changed to a Choyce PT wire.  The lesion was crossed. Initially 5000 units of heparin was administered.  The ACT was documented to be 256 seconds.  After getting the wire across the stenosis in the right coronary artery, we then performed an angioplasty using a 15 mm long x 2.5 mm CrossSail balloon.  Multiple balloon inflations were performed up and down the right coronary midsegment.  We then placed a 3.0 mm x 20.0 mm long CrossSail balloon, and performed an angioplasty in the proximal portion of the mid right coronary artery.  Following this we deployed a 23.0 mm long x 3.0 mm Penta stent to 14 atmospheres.  He had  brisk antegrade flow.  The right coronary artery both proximal and distal to the stented region was diffusely diseased, but with no area greater than 60% narrowed.  Distally in the right coronary around the PDA there was a 70%-90% stenosis before a large LV branch.  This was not angioplastied.  His ACT post-procedure was 256 seconds.  Plavix 300 mg was administered at the completion of the case.  Integrilin double bolus and infusion was started during the procedure.  RESULTS: HEMODYNAMIC DATA: Aortic pressure:  134/73 mmHg. Left ventricular pressure:  136/16 mmHg.  LEFT VENTRICULOGRAPHY:  Mild mid-inferior wall hypokinesis with an ejection fraction of greater than 50%.  Mild mitral regurgitation.  SELECTIVE CORONARY ANGIOGRAPHY: 1. Left main coronary artery:  In the left main coronary artery no    significant obstructive lesions are noted. 2. Left anterior descending coronary artery:  The left anterior descending    coronary artery is lumpy/bumpy, with a mid-60%-70% stenosis.  A large    diagonal arises proximal to this stenosis in the LAD, and is free of    significant obstruction. 3. Ramus intermedius branch:  A large ramus branch arises from the left    main.  It is lumpy/bumpy, but contains no significant obstruction. 4. Circumflex coronary artery:  The circumflex coronary artery is a small    vessel that gives one branching obtuse marginal vessel.  In the    inferior-most branch of the first obtuse marginal there is a 70% stenosis. 5. Right coronary artery:  The right  coronary artery is totally occluded    in the midvessel.  The distal right coronary artery is large and fills    by left to right collaterals.  PERCUTANEOUS CORONARY INTERVENTION:  The right coronary artery was 100% blocked prior to PCI, and reduced to 0% with brisk antegrade flow.  There is diffuse disease in the distal right coronary artery and the proximal right proximal artery proximal and distal to the  implanted stent.  CONCLUSIONS: 1. Total occlusion of the right coronary artery, resulting in progressive    severe angina pectoris over the last month. 2. Successful percutaneous coronary intervention of the right coronary    artery, with a reduction in stenosis from 100% to 0%. 3. Residual moderate diffuse disease in the proximal and mid-right    coronary artery. 4. Moderate left anterior descending coronary artery disease. 5. Minimal left ventricular dysfunction.  PLAN:  Aspirin, Plavix, clinical followup, smoking cessation, risk factor modification including lipid profiling and therapy. DD:  05/26/00 TD:  05/26/00 Job: [id number]

## 2010-11-29 NOTE — Op Note (Signed)
[place]. [facility]  Patient:    Samuel Watkins, Samuel Watkins                       MRN: [id number] Adm. Date:  [id number] Attending:  Cameron Proud                           Operative Report  PREOPERATIVE DIAGNOSIS:  Left lingular and left lower lobe nodules.  POSTOPERATIVE DIAGNOSIS:  Benign nodule and pulmonary lymph node, left upper lobe and left lower lobe.  OPERATION PERFORMED:  Left video-assisted thoracic surgery, resection of left upper lobe and left lower lobe nodules.  SURGEON:  D. Karle Plumber, M.D.  FIRST ASSISTANT:  Adair Patter, P.A.  ANESTHESIA:  General anesthesia.  DESCRIPTION OF PROCEDURE:  After percutaneous insertion of all monitoring lines, the patient underwent general anesthesia and prepped and draped in the usual sterile manner.  A dual-lumen tube was inserted and the left lung was deflated.  Two trocar sites were made in the anterior and posterior axillary lines at the seventh intercostal space and a 30 degree scope was inserted. Patient had very anthracotic lungs with a marked amount of pigment.  Third trocar site was then made over the fifth intercostal space and digital palpation was carried out and a nodule was felt in the lingular segment of the left upper lobe.  This was grasped with Adan Sis ring forceps and then resected with two applications of the [employer] SEB-40 stapler.  Then the [facility] ring forceps were used to reflect the right lower lobe medially and the other nodule could be seen in the superior segment of the left lower lobe and this was grasped with a Duval lung clamp and then resected with three applications of the SEB-45.  Frozen section revealed probable intrapulmonary lymph node and inflammatory pulmonary lymph node.  Two chest tubes were placed through the trocar sites and tied in place with 0 silk.  Chest was closed with one pericostal of #1 in the muscle layer and 2-0 Vicryl in the subcutaneous tissue and  3-0 Vicryl as a subcuticular stitch.  Patient was returned to the recovery room in stable condition. DD:  06/26/00 TD:  06/26/00 Job: [id number]

## 2010-11-29 NOTE — Discharge Summary (Signed)
[place]. [facility]  Patient:    Samuel Watkins, Samuel Watkins                       MRN: [id number] Adm. Date:  [id number] Disc. Date: [id number] Attending:  Cameron Proud Dictator:   Areta Haber, P.A. CC:         Vikki Ports, M.D., [facility]   Discharge Summary  HISTORY OF PRESENT ILLNESS:  This is a 65 year old male referred by Dr. Theresia Lo for evaluation of lung masses.  Apparently, the patient was experiencing upper respiratory symptoms accompanied with increasing dyspnea on exertion and cough as well as chest pain.  The patient ruled out for coronary artery disease by cardiac catheterization.  On evaluation chest x-ray showed a sclerotic lesion in the chest thought to be related to the rib.  A CT scan of the chest revealed a sclerotic density along the left first rib.  At the same time he was found to have 6-mm calcified nodules - one in the lingula and one in the left lower lobe.  Pulmonary function studies showed a forced vital capacity of 5.73 which was 100% predicted, and his FEV1 was 4.44 (99% predicted).  The patient was felt to be a candidate for video-assisted thoracoscopy for biopsy, and he was admitted this hospitalization for the procedure.  PAST MEDICAL HISTORY:  Possible history of hepatitis, uncertain which type.  PAST SURGICAL HISTORY:  Back surgery in 1997.  MEDICATION ON ADMISSION:  Aspirin 325 mg q.d.  ALLERGIES:  No known drug allergies.  SOCIAL HISTORY:  The patient is a smoker approximately one and a half packs a day of tobacco x 25 years, although he is now on a nicotine patch since November 2001 and smokes approximately three cigarettes a day.  REVIEW OF SYMPTOMS, FAMILY HISTORY, SOCIAL HISTORY, AND PHYSICAL EXAMINATION: Please see the History and Physical done at the time of admission.  HOSPITAL COURSE:  The patient was admitted electively.  He was taken to the operating room on June 26, 2000, at which time he underwent a left video-assisted thoracoscopy and mini thoracotomy with left upper lobe and left lower lobe wedge resections.  The patient tolerated the procedure well and was taken to the postanesthesia care unit in stable condition.  Postoperative hospital course:  The patient has had an unremarkable postoperative course.  He did require aggressive pulmonary toilet, but oxygen has been weaned.  His chest tubes and routine lines and monitors were discontinued in a stepwise manner.  He has tolerated a gradual increase in activities commensurate for level of postoperative convalescence.  His incisions are healing well without signs of infection.  Hemodynamically, he is stable.  Pathology has revealed the lesions to be benign anthrocotic lymph nodes.  He will not require further treatment for these lesions.  Tentatively, he felt to be quite stable for discharge in the morning of June 30, 2000, pending morning round reevaluation.  FINAL DIAGNOSIS:  Left upper and lower lobe lung nodules.  OTHER DIAGNOSES: 1. Questionable history of hepatitis, uncertain subtype. 2. History of back surgery in 1997. 3. History of tobacco abuse.  MEDICATIONS AT DISCHARGE: 1. Nicotine patch 21 mg q.24h. 2. Dilaudid 1 mg q.6h. p.r.n.  DISCHARGE FOLLOWUP:  Follow-up will be one week with Dr. Edwyna Shell.  CONDITION ON DISCHARGE:  Stable and improving.  DISCHARGE INSTRUCTIONS:  The patient will receive written instructions in regard to medications, activity, diet, wound care, and follow-up.  DD:  06/29/00 TD:  06/30/00 Job: [id number]

## 2011-04-02 LAB — I-STAT 8, (EC8 V) (CONVERTED LAB)
Acid-Base Excess: 1
BUN: 15
Bicarbonate: 27.8 — ABNORMAL HIGH
Chloride: 107
HCT: 46
Hemoglobin: 15.6
Operator id: 151321
Sodium: 140
pCO2, Ven: 49.7

## 2011-04-02 LAB — BASIC METABOLIC PANEL
Calcium: 9
GFR calc Af Amer: >60
GFR calc non Af Amer: >60
Potassium: 4.6
Sodium: 138

## 2011-04-02 LAB — CBC
HCT: 43.2
HCT: 45
Hemoglobin: 15.1
Hemoglobin: 15.6
MCV: 93.1
RBC: 4.83
WBC: 8.9
WBC: 9.7

## 2011-04-02 LAB — DIFFERENTIAL
Eosinophils Absolute: 0.1
Eosinophils Relative: 1
Lymphs Abs: 2
Monocytes Absolute: 0.8
Monocytes Relative: 9

## 2011-04-02 LAB — POCT I-STAT CREATININE
Creatinine, Ser: 1.1
Operator id: 151321

## 2011-04-02 LAB — PROTIME-INR: INR: 1

## 2011-04-18 LAB — TSH: TSH: 1.305

## 2011-04-18 LAB — BASIC METABOLIC PANEL
BUN: 15
CO2: 25
Calcium: 8.6
Chloride: 110
Creatinine, Ser: 0.91
GFR calc Af Amer: >60
Glucose, Bld: 99

## 2011-04-18 LAB — COMPREHENSIVE METABOLIC PANEL
AST: 21
Albumin: 4
BUN: 16
Calcium: 9.2
Chloride: 108
Creatinine, Ser: 0.95
GFR calc Af Amer: >60
Total Bilirubin: 0.9
Total Protein: 6.8

## 2011-04-18 LAB — I-STAT 8, (EC8 V) (CONVERTED LAB)
Acid-Base Excess: 1
Chloride: 111
Glucose, Bld: 84
TCO2: 24
pCO2, Ven: 28.9 — ABNORMAL LOW
pH, Ven: 7.503 — ABNORMAL HIGH

## 2011-04-18 LAB — LIPID PANEL
Cholesterol: 189
HDL: 18 — ABNORMAL LOW
LDL Cholesterol: 150 — ABNORMAL HIGH
Total CHOL/HDL Ratio: 10.5
VLDL: 21

## 2011-04-18 LAB — POCT CARDIAC MARKERS
CKMB, poc: 1.5
CKMB, poc: <1 — ABNORMAL LOW
Myoglobin, poc: 67.8
Operator id: 234501
Operator id: 294501
Troponin i, poc: <0.05

## 2011-04-18 LAB — CBC
HCT: 45.2
MCHC: 34.3
MCV: 94
Platelets: 193
RBC: 4.26
RDW: 13.4
RDW: 13.4
WBC: 9.8

## 2011-04-18 LAB — CK TOTAL AND CKMB (NOT AT ARMC)
CK, MB: 1.8
CK, MB: 9.9 — ABNORMAL HIGH
Relative Index: 1.7
Relative Index: INVALID
Total CK: 137
Total CK: 90

## 2011-04-18 LAB — TROPONIN I: Troponin I: 0.03

## 2011-04-18 LAB — PROTIME-INR: INR: 0.9

## 2011-04-18 LAB — POCT I-STAT CREATININE
Creatinine, Ser: 1.1
Operator id: 294501

## 2011-04-18 LAB — APTT: aPTT: 32

## 2011-04-20 ENCOUNTER — Ambulatory Visit (HOSPITAL_COMMUNITY)
Admission: EM | Admit: 2011-04-20 | Discharge: 2011-04-21 | Disposition: A | Discharge status: Home / Self Care | Payer: BC Managed Care – PPO | Source: Ambulatory Visit | Attending: Interventional Cardiology | Admitting: Interventional Cardiology

## 2011-04-20 ENCOUNTER — Emergency Department (HOSPITAL_COMMUNITY): Payer: BC Managed Care – PPO

## 2011-04-20 DIAGNOSIS — R079 Chest pain, unspecified: Secondary | ICD-10-CM

## 2011-04-20 DIAGNOSIS — Z01818 Encounter for other preprocedural examination: Secondary | ICD-10-CM | POA: Insufficient documentation

## 2011-04-20 DIAGNOSIS — I200 Unstable angina: Secondary | ICD-10-CM | POA: Insufficient documentation

## 2011-04-20 DIAGNOSIS — Z01812 Encounter for preprocedural laboratory examination: Secondary | ICD-10-CM | POA: Insufficient documentation

## 2011-04-20 DIAGNOSIS — I10 Essential (primary) hypertension: Secondary | ICD-10-CM | POA: Insufficient documentation

## 2011-04-20 DIAGNOSIS — R0609 Other forms of dyspnea: Secondary | ICD-10-CM | POA: Insufficient documentation

## 2011-04-20 DIAGNOSIS — J4489 Other specified chronic obstructive pulmonary disease: Secondary | ICD-10-CM | POA: Insufficient documentation

## 2011-04-20 DIAGNOSIS — R0989 Other specified symptoms and signs involving the circulatory and respiratory systems: Secondary | ICD-10-CM | POA: Insufficient documentation

## 2011-04-20 DIAGNOSIS — Z01810 Encounter for preprocedural cardiovascular examination: Secondary | ICD-10-CM | POA: Insufficient documentation

## 2011-04-20 DIAGNOSIS — Z87891 Personal history of nicotine dependence: Secondary | ICD-10-CM | POA: Insufficient documentation

## 2011-04-20 DIAGNOSIS — I2510 Atherosclerotic heart disease of native coronary artery without angina pectoris: Secondary | ICD-10-CM | POA: Insufficient documentation

## 2011-04-20 DIAGNOSIS — J449 Chronic obstructive pulmonary disease, unspecified: Secondary | ICD-10-CM | POA: Insufficient documentation

## 2011-04-20 DIAGNOSIS — Z9861 Coronary angioplasty status: Secondary | ICD-10-CM | POA: Insufficient documentation

## 2011-04-20 DIAGNOSIS — E785 Hyperlipidemia, unspecified: Secondary | ICD-10-CM | POA: Insufficient documentation

## 2011-04-20 DIAGNOSIS — R05 Cough: Secondary | ICD-10-CM | POA: Insufficient documentation

## 2011-04-20 DIAGNOSIS — R059 Cough, unspecified: Secondary | ICD-10-CM | POA: Insufficient documentation

## 2011-04-20 LAB — CBC
HCT: 37.9 % — ABNORMAL LOW (ref 39.0–52.0)
Hemoglobin: 13.6 g/dL (ref 13.0–17.0)
MCH: 33.6 pg (ref 26.0–34.0)
MCHC: 35.9 g/dL (ref 30.0–36.0)
MCV: 93.6 fL (ref 78.0–100.0)
Platelets: 151 10*3/uL (ref 150–400)
RBC: 4.05 MIL/uL — ABNORMAL LOW (ref 4.22–5.81)
RDW: 14.6 % (ref 11.5–15.5)
WBC: 11.2 10*3/uL — ABNORMAL HIGH (ref 4.0–10.5)

## 2011-04-20 LAB — POCT I-STAT TROPONIN I: Troponin i, poc: 0 ng/mL (ref 0.00–0.08)

## 2011-04-20 LAB — DIFFERENTIAL
Basophils Absolute: 0 10*3/uL (ref 0.0–0.1)
Basophils Relative: 0 % (ref 0–1)
Eosinophils Absolute: 0.2 10*3/uL (ref 0.0–0.7)
Eosinophils Relative: 1 % (ref 0–5)
Lymphocytes Relative: 23 % (ref 12–46)
Lymphs Abs: 2.5 10*3/uL (ref 0.7–4.0)
Monocytes Absolute: 0.9 10*3/uL (ref 0.1–1.0)
Monocytes Relative: 8 % (ref 3–12)
Neutro Abs: 7.7 10*3/uL (ref 1.7–7.7)
Neutrophils Relative %: 68 % (ref 43–77)

## 2011-04-20 LAB — COMPREHENSIVE METABOLIC PANEL
ALT: 32 U/L (ref 0–53)
Calcium: 9.7 mg/dL (ref 8.4–10.5)
GFR calc Af Amer: 61 mL/min — ABNORMAL LOW (ref >90)
Glucose, Bld: 116 mg/dL — ABNORMAL HIGH (ref 70–99)
Sodium: 139 mEq/L (ref 135–145)
Total Protein: 7.4 g/dL (ref 6.0–8.3)

## 2011-04-20 LAB — PROTIME-INR
INR: 1.02 (ref 0.00–1.49)
Prothrombin Time: 13.6 seconds (ref 11.6–15.2)

## 2011-04-20 LAB — CARDIAC PANEL(CRET KIN+CKTOT+MB+TROPI): Total CK: 129 U/L (ref 7–232)

## 2011-04-20 LAB — CK TOTAL AND CKMB (NOT AT ARMC)
CK, MB: 3 ng/mL (ref 0.3–4.0)
Total CK: 156 U/L (ref 7–232)

## 2011-04-21 LAB — CARDIAC PANEL(CRET KIN+CKTOT+MB+TROPI): Relative Index: 2.4 (ref 0.0–2.5)

## 2011-04-21 LAB — CBC
HCT: 32.7 % — ABNORMAL LOW (ref 39.0–52.0)
MCV: 94.2 fL (ref 78.0–100.0)
RBC: 3.47 MIL/uL — ABNORMAL LOW (ref 4.22–5.81)
WBC: 7.1 10*3/uL (ref 4.0–10.5)

## 2011-04-21 LAB — BASIC METABOLIC PANEL
BUN: 23 mg/dL (ref 6–23)
CO2: 25 mEq/L (ref 19–32)
Chloride: 105 mEq/L (ref 96–112)
Creatinine, Ser: 1.41 mg/dL — ABNORMAL HIGH (ref 0.50–1.35)

## 2011-04-24 NOTE — Cardiovascular Report (Signed)
NAMEMarland Kitchen  SHAE, AUGELLO NO.:  [id number]  MEDICAL RECORD NO.:  [id number]  LOCATION:  3729                         FACILITY:  [facility]  PHYSICIAN:  Jake Bathe, MD      DATE OF BIRTH:  1946/07/13  DATE OF PROCEDURE:  04/21/2011 DATE OF DISCHARGE:  04/21/2011                           CARDIAC CATHETERIZATION   INDICATIONS:  Mr. Herter is a 65 year old male with known coronary artery disease, status post RCA and LAD stent with symptoms concerning for unstable angina.  PROCEDURE DETAILS:  Informed consent was obtained.  Risk of stroke, heart attack, death, renal impairment, arterial damage, bleeding were explained to the patient at length.  Creatinine was 1.38/1.4 and he was hydrated overnight.  Careful discussion with he and his family/wife about possibility of renal impairment was discussed.  Ample time for questioning had.  Alternative treatments were discussed.  PROCEDURE DETAILS:  A 1% lidocaine was used for local anesthesia.  Right groin approach was utilized.  Femoral head visualized via fluoroscopy. A 5-French sheath was inserted into the right femoral artery without difficulty.  A Judkins left #4 catheter and a Judkins right #4 catheter were used to selectively cannulate the coronary arteries.  An angled pigtail was used to cross into the left ventricle and multiple views with hand injection of Omnipaque were obtained.  Careful consideration for dye sparing procedure.  The left ventriculogram was performed via hand injection of 7 mL of contrast.  The following procedure and hemodynamics, catheters were removed, and sheath was removed.  FINDINGS: 1. Left main artery branch into the LAD and circumflex artery.  There     is a ramus branch as well. 2. LAD.  Previously placed drug-eluting stent in the LAD distribution     is widely patent.  Proximal diagonal branch is also widely patent.     Prior PTCA/cutting balloon to diagonal branch.  Vessel is fairly     small in caliber, however.  The proximal edge of the stent is just     at the first septal branch. 3. Circumflex artery.  This vessel is fairly small in caliber also and     gives rise to 1 obtuse marginal branch. 4. Ramus branch - widely patent. 5. Right coronary artery.  Prox-to-mid right coronary artery stent is     widely patent.  This is the dominant vessel giving rise to the     posterior descending artery.  There is 1 large posterior lateral     branch as well.  Overall, minor luminal irregularities throughout.     No obstructive disease. 6. Left ventriculogram.  Hand injection.  Left ventricular ejection     fraction appears normal at 60%.  No wall motion abnormality is     detected.  Suboptimal filling of the ventricle.  HEMODYNAMICS:  Left ventricular systolic pressure 133 with an end- diastolic pressure of 13 mmHg.  Aortic pressure 133/73 with a mean of 98 mmHg.  IMPRESSIONS:  Previously placed right coronary artery and left anterior descending artery stents are widely patent and previously cutting balloon.  Diagonal branch is also widely patent.  Arteries are fairly small in caliber with only minor luminal  irregularities throughout. Normal left ventricular ejection fraction.  Overall continue with medical management.  Continue with Plavix.  Total fluoro time 2 minutes. Contrast 34 mL.     Jake Bathe, MD     MCS/MEDQ  D:  04/21/2011  T:  04/21/2011  Job:  [id number]  cc:   Lyn Records, M.D.  Electronically Signed by Donato Schultz MD on 04/24/2011 06:18:04 AM

## 2011-04-24 NOTE — Discharge Summary (Signed)
[name], [name] NO.:  [id number]  MEDICAL RECORD NO.:  [id number]  LOCATION:  3729                         FACILITY:  [facility]  PHYSICIAN:  Jake Bathe, MD      DATE OF BIRTH:  May 27, 1946  DATE OF ADMISSION:  04/20/2011 DATE OF DISCHARGE:  04/21/2011                              DISCHARGE SUMMARY   ADMISSION DIAGNOSES: 1. Unstable angina. 2. Coronary artery disease. 3. Former tobacco use. 4. Chronic obstructive pulmonary disease. 5. Chronic cough. 6. Questionable hepatitis C. 7. Dyslipidemia. 8. Hypertension. 9. Quit tobacco in 2012.  BRIEF HOSPITAL COURSE:  Samuel Watkins is a 65 year old male with known coronary artery disease, status post drug-eluting stent to LAD, cutting balloon to diagonal and prior RCA stent, who recently had a nuclear stress test done in the clinic.  I am unable to walk on the treadmill due to shortness of breath that was overall low risk.  He came in with sensation of chest burning, heartburn-like sensation that was similar to his previous anginal symptoms.  After a cup of coffee, he developed chest pressure, came on to the emergency room.  Because of his prior coronary artery disease and symptoms that may be consistent with unstable angina, he was taken to the Cardiac Catheterization Lab where both his RCA and LAD stent and D1 intervention were widely patent.  EF was normal.  Please see catheterization report for full details. Because of his creatinine of 1.38/1.4, hand-injection left ventriculogram was performed only.  Total amount of dye used was 37 mL. Also his losartan at 50 mg was held prior to cardiac catheterization. Blood pressure was 127/70, last night.  130s in the cath lab.  Because of his reassuring cardiac catheterization, he may be sent home and will have close followup with the cardiology clinic.  DISCHARGE MEDICATIONS: 1. Continuation of Plavix 75 mg once a day. 2. Aspirin 81 mg daily. 3. Atorvastatin 80  mg daily. 4. Combivent inhaler as needed. 5. Vitamin D 16109 units weekly, on Monday. 6. Niaspan 1000 mg daily at bedtime. 7. Nitroglycerin 0.4 mg sublingual p.r.n. 8. I have continued to hold his losartan and this may be restarted     next week when seen in clinic if creatinine is stable.  DISCHARGE PLAN:  Discharge is anticipated after 4 hours bed rest, post catheterization.  I do believe that the majority of his shortness of breath is secondary to COPD.  One may consider the use of long-acting nitrates in the future; however, I think optimization of his COPD management is essential.  Reassuring previously placed stents.  No beta- blocker currently because of COPD.  Continue with Plavix.  FOLLOWUP:  Follow up will be in 1 week with [name] in the clinic and I also encouraged him to follow up with Dr. [name], his primary physician.  LABORATORY DATA:  Cardiac markers were all negative on this admission.  White count 7.1, hemoglobin 11.6, platelet count slightly low at 138. Creatinine was 1.41.  EKG shows chronic right bundle-branch block.  No ischemic changes.     Jake Bathe, MD     MCS/MEDQ  D:  04/21/2011  T:  04/21/2011  Job:  [id number]  cc:   Lyn Records, M.D. Kandyce Rud, MD  Electronically Signed by Donato Schultz MD on 04/24/2011 06:18:08 AM

## 2011-04-24 NOTE — H&P (Signed)
NAMEMarland Kitchen  Samuel Watkins, Samuel Watkins NO.:  [id number]  MEDICAL RECORD NO.:  [id number]  LOCATION:  3729                         FACILITY:  [facility]  PHYSICIAN:  Pricilla Riffle, MD, FACCDATE OF BIRTH:  [birth year]  DATE OF ADMISSION:  04/20/2011 DATE OF DISCHARGE:                             HISTORY & PHYSICAL   IDENTIFICATION:  Mr. Samuel Watkins is a 65 year old who presents for evaluation of chest pain.  HISTORY OF PRESENT ILLNESS:  The patient is followed by Garnette Scheuermann in Cardiology Clinic.  He has a known CAD.  He was treated recently for pneumonia (5 weeks ago).  He was getting better and then he noticed over the past few days increased dyspnea on exertion.  No wheezes. Nonproductive cough (chronic).  He also notes an onset of chest burning, heartburn-like sensation which is like his previous angina over the past few days.  This can occur with and without exertion.  This morning, he had had a cup of coffee and developed chest pressure, heartburn, and he came to the emergency room.  He denies reflux symptoms such as bitter taste in his mouth.  Currently asymptomatic.  Note, he had a GXT in the past he reported normal.  ALLERGIES:  None.  Medications include: 1. Combivent inhaler p.r.n. 2. Plavix 75 daily. 3. Niaspan 1 g daily. 4. Losartan 50. 5. Lipitor 80. 6. Vitamin K 50,000 units q.week. 7. Advair 250/50 b.i.d.  PAST MEDICAL HISTORY: 1. CAD. 2. Question hepatitis C. 3. Dyslipidemia. 4. Hypertension.  Note, cardiac catheterization on July 09, 2007, left main normal, LAD 70%-80%, mid D1 of 70%, proximal small ramus moderate diffuse disease, left circumflex small, OM1 of 70%, RCA 99% in-stent restenosis. 30-40% prior to stent.  LVEF 60%.  The patient underwent PTCA/Taxus stent placement to the RCA.  On July 16, 2007, he underwent PTCA/Promus stent placement to the LAD and PTCA to the diagonal.  SOCIAL HISTORY:  The patient lives in [place].  He is married,  quit tobacco in January 2012.  Drinks occasionally.  FAMILY HISTORY:  Significant for father with CAD, brother with throat cancer.  REVIEW OF SYSTEMS:  No fevers, chills, dry cough as noted.  No orthopnea, occasional PND, chronic.  Notes frequent urination at night, but otherwise no problems.  No nausea, vomiting, or diarrhea. Otherwise, all systems reviewed and negative for the above problem except as noted above.  PHYSICAL EXAMINATION:  GENERAL:  On exam, the patient is in no acute distress, currently asymptomatic. VITAL SIGNS:  Temperature is 98, pulse is 76, respiratory rate 18, blood pressure 147/99, then 105/69. HEENT:  Normocephalic, atraumatic.  EOMI, PERRL.  Mucous membranes are moist. NECK:  JVP is normal.  No bruits.  No thyromegaly. LUNGS:  Clear to auscultation. CARDIAC:  Regular rate and rhythm.  S1, S2.  No S3.  No murmurs. ABDOMEN:  Supple, minimal umbilical tenderness.  Normal bowel sounds. No masses. EXTREMITIES:  Good distal pulses throughout.  No lower extremity edema. NEUROLOGIC:  Alert and oriented x3.  Cranial nerves II through XII grossly intact.  LABORATORY DATA:  Significant for COPD.  EKG with sinus rhythm, 79 beats per minute, right bundle-branch block.  Labs significant  for a hemoglobin of 13.6, WBC of 11.2, platelets of 151,000.  BUN and creatinine of 22 and 1.38, potassium of 3.7.  Troponin and point of care negative x2.  Albumin of 3.7.  IMPRESSION:  The patient is a 65 year old gentleman with known coronary artery disease, presents with a 3-day history of increased dyspnea on exertion, heartburn like his prior angina.  Again, he has been using his inhaler little bit, no wheezes, currently asymptomatic on exam, moving air.  No rales or wheezes.  EKG is negative.  Chest x-ray is negative.  My impression is that this is concerning for unstable angina.  Recommendations:  Admit, rule out MI, n.p.o. after midnight.  Hank Smith to see in the  morning.  Consider left heart cath.  We will hold losartan in the morning. 1. Chronic obstructive pulmonary disease.  Continue inhalers.     Congratulated on tobacco cessation. 2. Dyslipidemia.  Continue statin. 3. Hypertension.  We will follow.  Hold losartan in a.m.     Pricilla Riffle, MD, [facility]     PVR/MEDQ  D:  04/20/2011  T:  04/20/2011  Job:  [id number]  Electronically Signed by Dietrich Pates MD FACC on 04/24/2011 10:22:52 AM

## 2011-07-03 ENCOUNTER — Encounter: Payer: Self-pay | Admitting: Pulmonary Disease

## 2011-07-04 ENCOUNTER — Encounter: Payer: Self-pay | Admitting: Pulmonary Disease

## 2011-07-04 ENCOUNTER — Ambulatory Visit (INDEPENDENT_AMBULATORY_CARE_PROVIDER_SITE_OTHER): Payer: BC Managed Care – PPO | Admitting: Pulmonary Disease

## 2011-07-04 VITALS — BP 120/70 | HR 81 | Temp 97.5°F | Ht 73.0 in | Wt 232.2 lb

## 2011-07-04 DIAGNOSIS — R0989 Other specified symptoms and signs involving the circulatory and respiratory systems: Secondary | ICD-10-CM

## 2011-07-04 DIAGNOSIS — J449 Chronic obstructive pulmonary disease, unspecified: Secondary | ICD-10-CM | POA: Insufficient documentation

## 2011-07-04 NOTE — Patient Instructions (Signed)
Will schedule for breathing tests, and see you back same day for review.  Do not use any inhaler within 12 hrs of testing if you are able Work on weight loss and conditioning Will see you back to discuss breathing tests.

## 2011-07-04 NOTE — Assessment & Plan Note (Signed)
The patient has worsening dyspnea on exertion which definitely impacts his quality of life.  He has a long history of smoking, but it is unclear whether he really has airflow obstruction or not.  He will need full PFTs for evaluation.  He also has a history of cardiac disease, but apparently has been cleared by his cardiologist.  Maryclare Labrador get his records sent to [place] for documentation.  Finally, the patient has gained 30 pounds this year alone, and admits to leading a sedentary lifestyle.  This may be more of an issue for him than anything else.

## 2011-07-04 NOTE — Progress Notes (Signed)
  Subjective:    Patient ID: Samuel Watkins, male    DOB: [birth year], 65 y.o.   MRN: [id number]  HPI Patient is a 65 year old male who I have been asked to see for dyspnea on exertion.  The patient has had some chronic dyspnea on exertion for years it has been very mild, but he has seen worsening in his symptoms since January.  He describes a one block dyspnea on exertion at a moderate pace on flat ground, and will get winded bringing groceries in from the car or walking slowly up one flight of stairs.  He has an intermittent cough, but produces no significant mucous.  He denies any issues with lower extremity edema.  Patient does have a history of coronary artery disease, and is status post stenting in the past.  He believes that he has had a cardiac catheterization either this year or last year that was unremarkable.  The patient has had a chest x-ray this year which showed minimal scarring in the lingula, and changes of "COPD".  The patient has never had pulmonary function studies.  He currently uses Combivent p.r.n., and has been tried on Advair and symbicort which caused a burning in his throat and increased cough.  The patient states that his weight is up about 30 pounds since January, and he admits to doing no exercise or significant physical activity.   Review of Systems  Constitutional: Negative for fever and unexpected weight change.  HENT: Positive for trouble swallowing. Negative for ear pain, nosebleeds, congestion, sore throat, rhinorrhea, sneezing, dental problem, postnasal drip and sinus pressure.   Eyes: Negative for redness and itching.  Respiratory: Positive for cough and shortness of breath. Negative for chest tightness and wheezing.   Cardiovascular: Negative for palpitations and leg swelling.  Gastrointestinal: Negative for nausea and vomiting.  Genitourinary: Negative for dysuria.  Musculoskeletal: Negative for joint swelling.  Skin: Negative for rash.  Neurological: Negative  for headaches.  Hematological: Bruises/bleeds easily.  Psychiatric/Behavioral: Negative for dysphoric mood. The patient is not nervous/anxious.        Objective:   Physical Exam Constitutional:  Overweight male, no acute distress  HENT:  Nares patent without discharge  Oropharynx without exudate, palate and uvula are normal  Eyes:  Perrla, eomi, no scleral icterus  Neck:  No JVD, no TMG  Cardiovascular:  Normal rate, regular rhythm, no rubs or gallops.  No murmurs        Intact distal pulses  Pulmonary :  Mildly decreased breath sounds, no stridor or respiratory distress   No rales, rhonchi, or wheezing  Abdominal:  Soft, nondistended, bowel sounds present.  No tenderness noted.   Musculoskeletal:  No lower extremity edema noted.  Lymph Nodes:  No cervical lymphadenopathy noted  Skin:  No cyanosis noted  Neurologic:  Alert, appropriate, moves all 4 extremities without obvious deficit.         Assessment & Plan:

## 2011-07-30 ENCOUNTER — Encounter: Payer: Self-pay | Admitting: Pulmonary Disease

## 2011-07-30 ENCOUNTER — Ambulatory Visit (INDEPENDENT_AMBULATORY_CARE_PROVIDER_SITE_OTHER): Payer: BC Managed Care – PPO | Admitting: Pulmonary Disease

## 2011-07-30 VITALS — BP 134/88 | HR 56 | Temp 98.4°F | Ht 73.0 in | Wt 234.0 lb

## 2011-07-30 DIAGNOSIS — J449 Chronic obstructive pulmonary disease, unspecified: Secondary | ICD-10-CM

## 2011-07-30 LAB — PULMONARY FUNCTION TEST

## 2011-07-30 NOTE — Patient Instructions (Signed)
Will start on spiriva one inhalation each am.  Use for 4 weeks, and give me some feedback by phone.  Will call in prescription if you are doing better with it. Stop combivent.  Use proair 2 puffs up to every 6 hrs if needed for rescue/emergency only. Work on weight loss and conditioning. followup with me in 4mos.

## 2011-07-30 NOTE — Progress Notes (Signed)
  Subjective:    Patient ID: Samuel Watkins, male    DOB: [birth year], 66 y.o.   MRN: [id number]  HPI The patient comes in today for followup of his pulmonary function studies, done as part of a workup for dyspnea on exertion.  He was found to have moderate airflow obstruction, as well as significant air trapping.  His diffusion capacity was moderately reduced.  I have reviewed the study with him in detail, and answered all of his questions.   Review of Systems  Constitutional: Negative for fever and unexpected weight change.  HENT: Negative for ear pain, nosebleeds, congestion, sore throat, rhinorrhea, sneezing, trouble swallowing, dental problem, postnasal drip and sinus pressure.   Eyes: Negative for redness and itching.  Respiratory: Positive for cough and shortness of breath. Negative for chest tightness and wheezing.   Cardiovascular: Negative for palpitations and leg swelling.  Gastrointestinal: Negative for nausea and vomiting.  Genitourinary: Negative for dysuria.  Musculoskeletal: Negative for joint swelling.  Skin: Negative for rash.  Neurological: Negative for headaches.  Hematological: Bruises/bleeds easily.  Psychiatric/Behavioral: Negative for dysphoric mood. The patient is not nervous/anxious.        Objective:   Physical Exam Overweight male in no acute distress Nose without purulence or discharge noted Chest mild decreased breath sounds, no wheezing Lower extremities without edema, no cyanosis Alert and oriented, moves all 4 extremities.       Assessment & Plan:

## 2011-07-30 NOTE — Progress Notes (Signed)
PFT done today. 

## 2011-07-30 NOTE — Assessment & Plan Note (Signed)
The patient has mild-to-moderate airflow obstruction by his pulmonary function studies, and this is likely due to to emphysema.  This is certainly playing a role in his symptom of dyspnea on exertion, but does not totally explain the severity of his complaints.  I also think his weight and deconditioning are playing significant role this.  The patient has a history of intolerance to Advair and symbicort, and therefore we'll start him on a trial of Spiriva.  I have also encouraged him to work aggressively on weight loss and conditioning.

## 2011-08-06 ENCOUNTER — Encounter: Payer: Self-pay | Admitting: Pulmonary Disease

## 2011-08-11 ENCOUNTER — Telehealth: Payer: Self-pay | Admitting: Pulmonary Disease

## 2011-08-11 NOTE — Telephone Encounter (Signed)
Spoke with pt. He was started on spiriva on 07/30/11 and states did fine the first 3 days, but since then has been waking up in the night OOB and "feels like smothering"- proair helps when this occurs. He states that he feels that the spiriva is causing this since did not have this issue before.  KC, please advise, thanks! No Known Allergies

## 2011-08-11 NOTE — Telephone Encounter (Signed)
I spoke with pt and advised him of KC recs. Pt states he wants to stop the spiriva and would like to try the dulera. Pt is are of the directions and sample has been left upfront for p/u. Nothing further was needed

## 2011-08-11 NOTE — Telephone Encounter (Signed)
Let him know it is unlikely that spiriva is causing his issue at night, but if he feels strongly it is, can stop.  We can try him on dulera 100/5  2 inhalations am and pm, rinse well.  Can come by office for sample to try, and give me feedback.

## 2011-08-28 ENCOUNTER — Telehealth: Payer: Self-pay | Admitting: Pulmonary Disease

## 2011-08-28 NOTE — Telephone Encounter (Signed)
I spoke with pt and he states he is still waking up at night not being able to breathe. Pt states he is having to use his combivent inhaler every night. Pt was advised on 08/11/11 when he called to stop the spiriva and try the dulera but he never came by to pick up the sample. Pt states he wanted to continue on the spiriva to see how he would do. Please advise Dr. Shelle Iron, thanks

## 2011-08-29 MED ORDER — ALBUTEROL SULFATE HFA 108 (90 BASE) MCG/ACT IN AERS: INHALATION_SPRAY | RESPIRATORY_TRACT | Status: DC

## 2011-08-29 NOTE — Telephone Encounter (Signed)
I spoke with pt and he is aware of [facility]. I have left a sample of the dulera upfront for pick up and is aware of the directions. Also pt requesting an rx for his proair be sent to the pharmacy. This has been sent to the pharmacy and nothing further was needed

## 2011-08-29 NOTE — Telephone Encounter (Signed)
If the spiriva is not working for him, and it sounds like it is not, then he needs to try dulera as we discussed.

## 2011-09-03 ENCOUNTER — Telehealth: Payer: Self-pay | Admitting: Pulmonary Disease

## 2011-09-03 NOTE — Telephone Encounter (Signed)
Called spoke with patient who stated that the spiriva helped with the SOB during the day more than the dulera but has been struggling with a lot of dryness in his nose since beginning the spiriva.  Has been using saline nasal spray with short-term relief and has a humidifier at home.  Pt states he has been using the proair every 6 six hours without much relief either.  Wakes up during the night out of breath, which when his breathing is worse.  Appointment scheduled 3.4.13 (earliest available) with KC to discuss.

## 2011-09-03 NOTE — Telephone Encounter (Signed)
Please send this to Aundra Millet so that we can get him in sooner for an acute sick visit.

## 2011-09-03 NOTE — Telephone Encounter (Signed)
Ok to book next Tuesday 09/09/11 at 4:30pm.  ATC pt x 3.  Line busy.  [facility] tomorrow

## 2011-09-03 NOTE — Telephone Encounter (Signed)
Meg, please advise of a work-in appt for Mr Stockinger.  Thanks.

## 2011-09-03 NOTE — Telephone Encounter (Signed)
He seems to primarily have complaints at night while sleeping.  This could possibly be sleep apnea? Do the inhalers help his breathing during the day?  I think he needs an ov to figure out what is going on.

## 2011-09-03 NOTE — Telephone Encounter (Signed)
Called spoke with patient who reports he is still waking up at night unable to breathe - states it is worse since beginning the dulera x1 week with increased SOB during the day as well.  Pt stated he has been taking this as directed = 2 puffs twice daily.  Dr Shelle Iron please advise, thanks.

## 2011-09-04 NOTE — Telephone Encounter (Signed)
Spoke with pt and scheduled appt with KC for 09/09/11 at 4:30 pm.

## 2011-09-09 ENCOUNTER — Ambulatory Visit (INDEPENDENT_AMBULATORY_CARE_PROVIDER_SITE_OTHER): Payer: BC Managed Care – PPO | Admitting: Pulmonary Disease

## 2011-09-09 ENCOUNTER — Encounter: Payer: Self-pay | Admitting: Pulmonary Disease

## 2011-09-09 DIAGNOSIS — R0601 Orthopnea: Secondary | ICD-10-CM

## 2011-09-09 DIAGNOSIS — R0600 Dyspnea, unspecified: Secondary | ICD-10-CM | POA: Insufficient documentation

## 2011-09-09 DIAGNOSIS — J449 Chronic obstructive pulmonary disease, unspecified: Secondary | ICD-10-CM

## 2011-09-09 NOTE — Patient Instructions (Signed)
Will try advair HFA 115/21  2 inhalations am and pm. Rinse well.  Please call me if issues with tolerance Use proair only for rescue/emergencies Will schedule for home sleep testing, and will call you with results once available.

## 2011-09-09 NOTE — Assessment & Plan Note (Signed)
The patient has been having severe shortness of breath at night that awakens him from sleep, but his history is suggestive of sleep apnea.  He has known loud snoring, and also some nonrestorative sleep.  He is overweight and has an abnormal upper airway.  We'll schedule him for a sleep study for evaluation.

## 2011-09-09 NOTE — Assessment & Plan Note (Signed)
The patient has known mild to moderate COPD that contributes to his dyspnea on exertion, but so does his obesity and deconditioning.  He is been tried on many different bronchodilator regimens, and either has not had improvement or has had side effects.  He thought Advair helped his breathing, but he could not tolerate the dry powder because of coughing.  I would like to try him on the [facility] preparation.  I have also stressed to him the importance of weight loss and conditioning.

## 2011-09-09 NOTE — Progress Notes (Signed)
  Subjective:    Patient ID: Samuel Watkins, male    DOB: January 25, 1946, 66 y.o.   MRN: [id number]  HPI The patient comes in today for followup of his COPD, and also nocturnal dyspnea.  He has mild to moderate airflow obstruction on PFTs, but has not tolerated or has not seen clinical improvement with any of the bronchodilators that we have tried so far.  He continues to have dyspnea on exertion during the day with heavier activities, but is having significant shortness of breath at night that awakens him from sleep.  On closer questioning, the patient has a history of loud snoring, and also has issues with nonrestorative sleep.   Review of Systems  Constitutional: Negative for fever and unexpected weight change.  HENT: Negative for ear pain, nosebleeds, congestion, sore throat, rhinorrhea, sneezing, trouble swallowing, dental problem, postnasal drip and sinus pressure.   Eyes: Negative for redness and itching.  Respiratory: Positive for shortness of breath. Negative for cough, chest tightness and wheezing.   Cardiovascular: Negative for palpitations and leg swelling.  Gastrointestinal: Negative for nausea and vomiting.  Genitourinary: Negative for dysuria.  Musculoskeletal: Negative for joint swelling.  Skin: Negative for rash.  Neurological: Negative for headaches.  Hematological: Bruises/bleeds easily.  Psychiatric/Behavioral: Negative for dysphoric mood. The patient is not nervous/anxious.        Objective:   Physical Exam Overweight male in no acute distress Nose without purulence or discharge noted Chest with mild decrease in breath sounds, otherwise clear Lower extremities with no edema, no cyanosis Alert and oriented, moves all 4 extremities.       Assessment & Plan:

## 2011-09-15 ENCOUNTER — Ambulatory Visit: Payer: BC Managed Care – PPO | Admitting: Pulmonary Disease

## 2011-09-23 ENCOUNTER — Telehealth: Payer: Self-pay | Admitting: Pulmonary Disease

## 2011-09-23 ENCOUNTER — Other Ambulatory Visit: Payer: Self-pay | Admitting: Pulmonary Disease

## 2011-09-23 DIAGNOSIS — R0600 Dyspnea, unspecified: Secondary | ICD-10-CM

## 2011-09-23 NOTE — Telephone Encounter (Signed)
KC,please advise of what type sleep study you would like patient to have.

## 2011-09-29 NOTE — Telephone Encounter (Signed)
According to epic order has been placed

## 2011-10-03 ENCOUNTER — Telehealth: Payer: Self-pay | Admitting: Pulmonary Disease

## 2011-10-03 MED ORDER — FLUTICASONE-SALMETEROL 115-21 MCG/ACT IN AERO: 2.0000 | INHALATION_SPRAY | Freq: Two times a day (BID) | RESPIRATORY_TRACT | Status: DC

## 2011-10-03 NOTE — Telephone Encounter (Signed)
FYI KC   Spoke with pt was gave a trial of advair 115/21 back on 09/09/2011 States that this is working well for him and requesting rx  Sent rx to [facility] on [address].  Pt is aware

## 2011-10-21 ENCOUNTER — Telehealth: Payer: Self-pay | Admitting: Pulmonary Disease

## 2011-10-21 NOTE — Telephone Encounter (Signed)
Pt does not want to do a formal sleep study at the sleep center and says he can not afford the deductible at this time. I will forward to [facility] as an FYI.

## 2011-10-21 NOTE — Telephone Encounter (Signed)
I understand.  Then, he needs f/u for his breathing to see how he has done on inhaler.  See if he will come in 8 weeks from last visit.

## 2011-10-22 NOTE — Telephone Encounter (Signed)
I spoke with pt and is scheduled to come in 11/03/11 at 11:!5 for OV. Nothing further was needed

## 2011-11-03 ENCOUNTER — Ambulatory Visit (INDEPENDENT_AMBULATORY_CARE_PROVIDER_SITE_OTHER): Payer: BC Managed Care – PPO | Admitting: Pulmonary Disease

## 2011-11-03 ENCOUNTER — Encounter: Payer: Self-pay | Admitting: Pulmonary Disease

## 2011-11-03 VITALS — BP 104/62 | HR 57 | Temp 97.5°F | Ht 73.0 in | Wt 228.0 lb

## 2011-11-03 DIAGNOSIS — J449 Chronic obstructive pulmonary disease, unspecified: Secondary | ICD-10-CM

## 2011-11-03 NOTE — Patient Instructions (Signed)
Stay on current advair, but rinse/gargle/ then swallow to see if this helps with hoarseness. Work on weight loss and conditioning.  followup with me in one year if doing well.

## 2011-11-03 NOTE — Progress Notes (Signed)
  Subjective:    Patient ID: Samuel Watkins, male    DOB: Sep 08, 1945, 66 y.o.   MRN: [id number]  HPI The patient comes in today for followup of his known COPD and dyspnea on exertion.  He is either not tolerated or not responded to different bronchodilators, but seems to have done better on the Advair HFA.  He feels that it has helped his exertional tolerance.  He is having some dysphonia, despite rinsing his mouth well.  He has not had any significant cough or mucus.   Review of Systems  Constitutional: Negative for fever and unexpected weight change.  HENT: Positive for voice change. Negative for ear pain, nosebleeds, congestion, sore throat, rhinorrhea, sneezing, trouble swallowing, dental problem, postnasal drip and sinus pressure.   Eyes: Negative for redness and itching.  Respiratory: Negative for cough, chest tightness, shortness of breath and wheezing.   Cardiovascular: Negative for palpitations and leg swelling.  Gastrointestinal: Negative for nausea and vomiting.  Genitourinary: Negative for dysuria.  Musculoskeletal: Negative for joint swelling.  Skin: Negative for rash.  Neurological: Negative for headaches.  Hematological: Bruises/bleeds easily.  Psychiatric/Behavioral: Negative for dysphoric mood. The patient is not nervous/anxious.        Objective:   Physical Exam Ow male in nad Nose without purulence or discharge Chest fairly clear, no wheezing Cor with rrr LE without significant edema, no cyanosis Alert and oriented, moves all 4.        Assessment & Plan:

## 2011-11-03 NOTE — Assessment & Plan Note (Signed)
The patient has done better since being on Advair HFA, and I've asked him to continue on this but with better rinsing.  I have also encouraged him to work aggressively on weight loss and his conditioning.

## 2011-11-28 ENCOUNTER — Ambulatory Visit: Payer: BC Managed Care – PPO | Admitting: Pulmonary Disease

## 2012-08-29 IMAGING — CR DG CHEST 1V PORT
1 series · 1 of 1 positions shown · non-contrast
Comparison: 02/28/2011

CLINICAL DATA: Chest pain, shortness of breath

PORTABLE CHEST - 1 VIEW

[view not recorded]
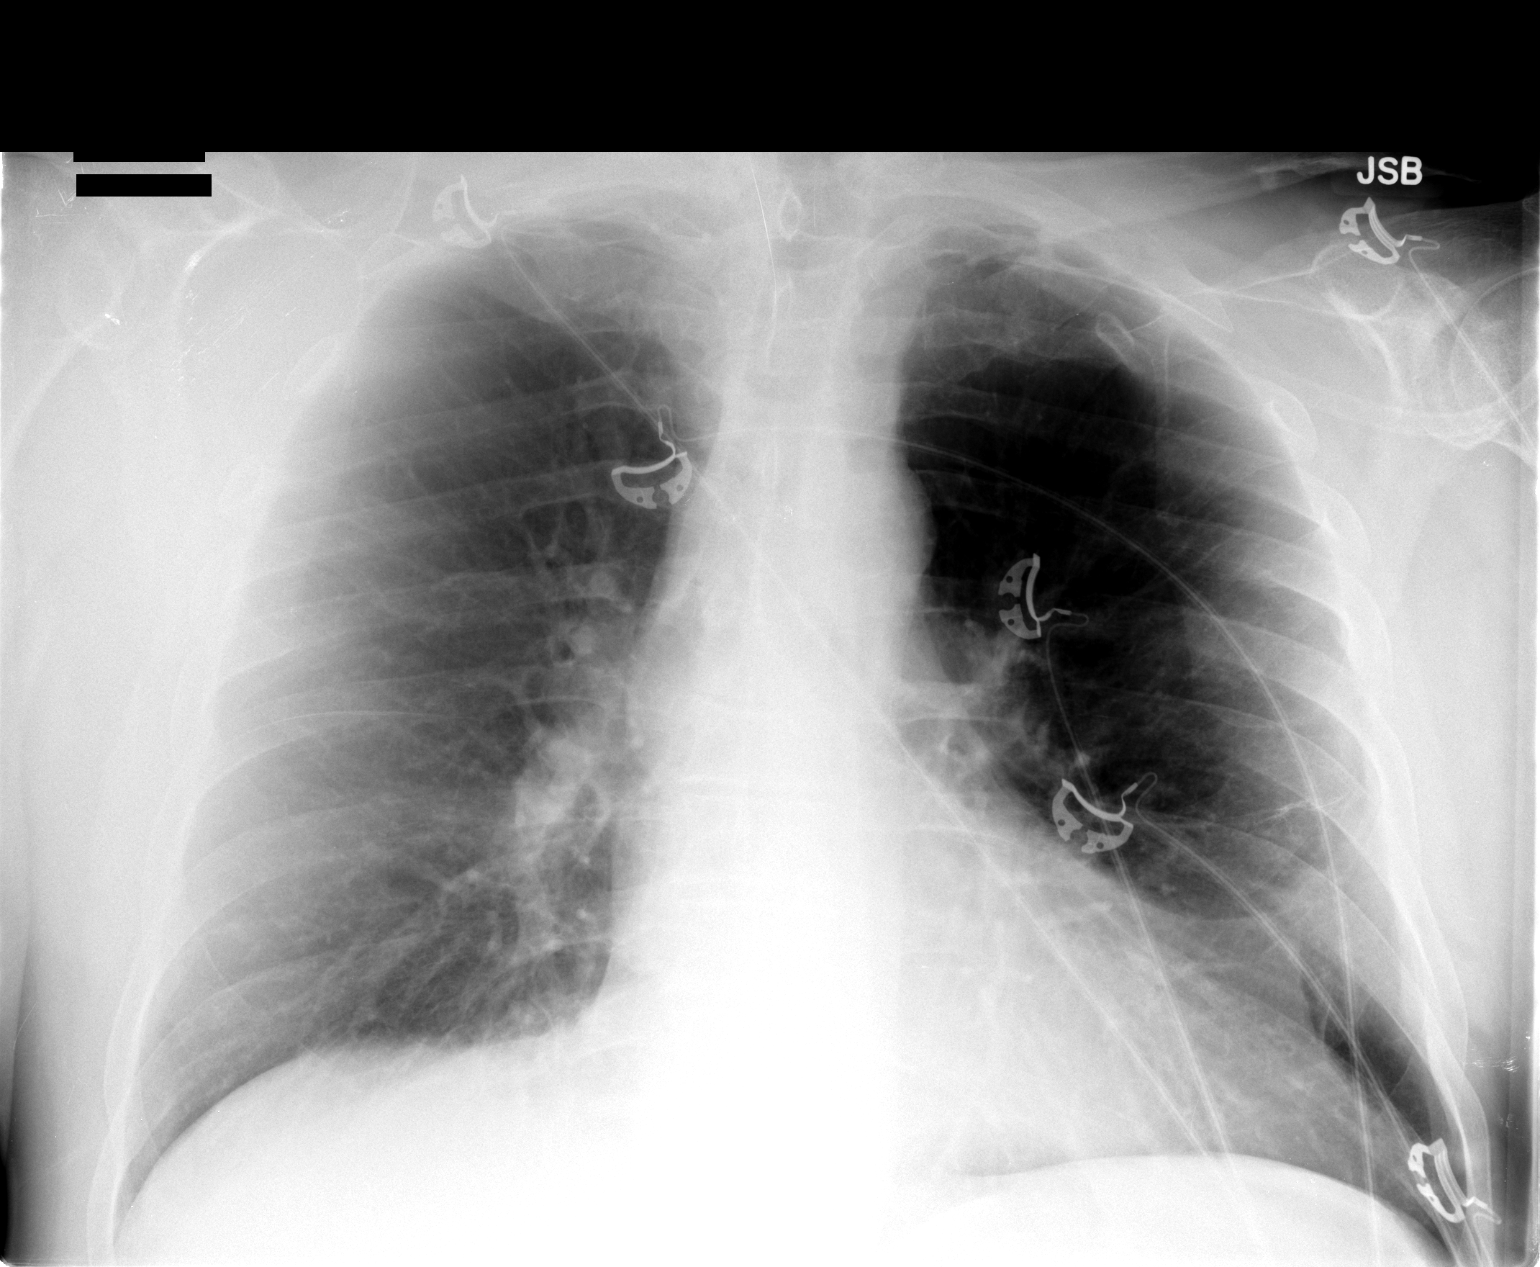

[1 of 1 positions shown; findings below may reference images not displayed]

FINDINGS: Hyperinflation noted, suspicious for background
COPD/emphysema.  Normal heart size and vascularity.  Chronic
scarring in the lingular region.  No superimposed acute pneumonia,
collapse, consolidation, edema, effusion or pneumothorax.  Stable
exam.
IMPRESSION: Stable chronic findings and COPD/emphysema.

## 2012-10-15 ENCOUNTER — Other Ambulatory Visit: Payer: Self-pay | Admitting: Pulmonary Disease

## 2012-10-19 ENCOUNTER — Telehealth: Payer: Self-pay | Admitting: Pulmonary Disease

## 2012-10-19 MED ORDER — FLUTICASONE-SALMETEROL 115-21 MCG/ACT IN AERO: 2.0000 | INHALATION_SPRAY | Freq: Two times a day (BID) | RESPIRATORY_TRACT | Status: DC

## 2012-10-19 NOTE — Telephone Encounter (Signed)
Spoke with pt to verify the msg Rx sent x 1 only and pt advised to keep ov for future refills

## 2012-11-02 ENCOUNTER — Ambulatory Visit (INDEPENDENT_AMBULATORY_CARE_PROVIDER_SITE_OTHER): Payer: Medicare Other | Admitting: Pulmonary Disease

## 2012-11-02 ENCOUNTER — Encounter: Payer: Self-pay | Admitting: Pulmonary Disease

## 2012-11-02 VITALS — BP 128/80 | HR 84 | Temp 97.4°F | Ht 73.0 in | Wt 230.2 lb

## 2012-11-02 DIAGNOSIS — J449 Chronic obstructive pulmonary disease, unspecified: Secondary | ICD-10-CM

## 2012-11-02 NOTE — Patient Instructions (Addendum)
No change in current breathing medications. Work on weight loss and conditioning. followup with me in one year if doing well.

## 2012-11-02 NOTE — Assessment & Plan Note (Signed)
The patient is doing fairly well overall, and rarely has required his rescue inhaler.  He thinks the Advair does help, but not significantly so.  I think this is because he had mild disease, and the majority of his dyspnea on exertion is related to his weight and conditioning.  I have offered to try him on a new LABA/LAMA combination, but he would just like to stick with Advair for now.  I will see him back in one year to check on his progress, but he is to call me if he has worsening symptoms.

## 2012-11-02 NOTE — Progress Notes (Signed)
  Subjective:    Patient ID: Samuel Watkins, male    DOB: 28-Jun-1946, 67 y.o.   MRN: [id number]  HPI The patient comes in today for followup of his mild COPD.  He has been staying compliant on Advair, and rarely uses his albuterol rescue.  He feels that his dyspnea on exertion is about the same, and he is unsure if the Advair is truly helping him or not.  I tried to explain to him that a lot of his dyspnea on exertion is related to his weight and conditioning.  He denies any significant chest congestion or purulent mucus.   Review of Systems  Constitutional: Negative for fever and unexpected weight change.  HENT: Positive for congestion. Negative for ear pain, nosebleeds, sore throat, rhinorrhea, sneezing, trouble swallowing, dental problem, postnasal drip and sinus pressure.        Allergies   Eyes: Negative for redness and itching.  Respiratory: Positive for shortness of breath. Negative for cough, chest tightness and wheezing.   Cardiovascular: Negative for palpitations and leg swelling.  Gastrointestinal: Negative for nausea and vomiting.  Genitourinary: Negative for dysuria.  Musculoskeletal: Negative for joint swelling.  Skin: Negative for rash.  Neurological: Negative for headaches.  Hematological: Does not bruise/bleed easily.  Psychiatric/Behavioral: Negative for dysphoric mood. The patient is not nervous/anxious.        Objective:   Physical Exam Overweight male in no acute distress Nose with purulent discharge noted Neck without lymphadenopathy or thyromegaly Chest totally clear to auscultation, no wheezing Cardiac exam with regular rate and rhythm Lower extremities without edema, cyanosis Alert and oriented, moves all 4 extremities.       Assessment & Plan:

## 2012-12-24 ENCOUNTER — Telehealth: Payer: Self-pay | Admitting: Pulmonary Disease

## 2012-12-24 MED ORDER — FLUTICASONE-SALMETEROL 115-21 MCG/ACT IN AERO: INHALATION_SPRAY | RESPIRATORY_TRACT | Status: DC

## 2012-12-24 NOTE — Telephone Encounter (Signed)
I spoke with pt and is aware rx has been called in. Nothing further was needed 

## 2013-08-05 ENCOUNTER — Other Ambulatory Visit: Payer: Self-pay | Admitting: Pulmonary Disease

## 2013-08-05 MED ORDER — FLUTICASONE-SALMETEROL 115-21 MCG/ACT IN AERO: INHALATION_SPRAY | RESPIRATORY_TRACT | Status: DC

## 2013-08-12 ENCOUNTER — Other Ambulatory Visit: Payer: Self-pay

## 2013-08-12 MED ORDER — CLOPIDOGREL BISULFATE 75 MG PO TABS: 75.0000 mg | ORAL_TABLET | Freq: Every day | ORAL | Status: DC

## 2013-09-22 ENCOUNTER — Encounter: Payer: Self-pay | Admitting: Interventional Cardiology

## 2013-11-01 ENCOUNTER — Ambulatory Visit: Payer: Medicare Other | Admitting: Interventional Cardiology

## 2013-11-02 ENCOUNTER — Ambulatory Visit (INDEPENDENT_AMBULATORY_CARE_PROVIDER_SITE_OTHER)
Admission: RE | Admit: 2013-11-02 | Discharge: 2013-11-02 | Disposition: A | Discharge status: Home / Self Care | Payer: Commercial Managed Care - HMO | Source: Ambulatory Visit | Attending: Pulmonary Disease | Admitting: Pulmonary Disease

## 2013-11-02 ENCOUNTER — Encounter: Payer: Self-pay | Admitting: Pulmonary Disease

## 2013-11-02 ENCOUNTER — Ambulatory Visit (INDEPENDENT_AMBULATORY_CARE_PROVIDER_SITE_OTHER): Payer: Commercial Managed Care - HMO | Admitting: Pulmonary Disease

## 2013-11-02 VITALS — BP 122/80 | HR 61 | Temp 97.6°F | Ht 73.0 in | Wt 232.8 lb

## 2013-11-02 DIAGNOSIS — J449 Chronic obstructive pulmonary disease, unspecified: Secondary | ICD-10-CM

## 2013-11-02 DIAGNOSIS — J4489 Other specified chronic obstructive pulmonary disease: Secondary | ICD-10-CM

## 2013-11-02 MED ORDER — ALBUTEROL SULFATE HFA 108 (90 BASE) MCG/ACT IN AERS: INHALATION_SPRAY | RESPIRATORY_TRACT | Status: DC

## 2013-11-02 NOTE — Patient Instructions (Signed)
No change in medications Stay as active as possible, and work on weight reduction Will check a chest xray today, and will call you with results.  followup with me again in one year.

## 2013-11-02 NOTE — Progress Notes (Signed)
   Subjective:    Patient ID: Samuel Watkins, male    DOB: 06-03-1946, 68 y.o.   MRN: [id number]  HPI The patient comes in today for followup of his known mild to moderate COPD. He is staying on his maintenance regimen, and has not had an acute exacerbation since last visit. His exercise tolerance is completely stable from last year, and he is able to do whatever he wishes. He denies any significant cough or mucus. 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   Review of Systems  Constitutional: Negative for fever and unexpected weight change.  HENT: Negative for congestion, dental problem, ear pain, nosebleeds, postnasal drip, rhinorrhea, sinus pressure, sneezing, sore throat and trouble swallowing.   Eyes: Negative for redness and itching.  Respiratory: Negative for cough, chest tightness, shortness of breath and wheezing.   Cardiovascular: Negative for palpitations and leg swelling.  Gastrointestinal: Negative for nausea and vomiting.  Genitourinary: Negative for dysuria.  Musculoskeletal: Negative for joint swelling.  Skin: Negative for rash.  Neurological: Negative for headaches.  Hematological: Does not bruise/bleed easily.  Psychiatric/Behavioral: Negative for dysphoric mood. The patient is not nervous/anxious.        Objective:   Physical Exam Overweight male in no acute distress Nose without purulence or discharge noted Neck without lymphadenopathy or thyromegaly Chest with clear breath sounds, no wheezes or rhonchi Cardiac exam with regular rate and rhythm Lower extremities without edema, no cyanosis Alert and oriented, moves all 4 extremities.       Assessment & Plan:

## 2013-11-02 NOTE — Assessment & Plan Note (Signed)
The patient overall is doing fairly well from a breathing standpoint. He is able to do whatever task he wishes, but has to pace himself because of dyspnea on exertion. He has not had any acute flare since the last visit. I've asked him to continue on his current inhaler regimen, and to work on better conditioning and weight loss. We'll check a chest x-ray today and let him know the results.

## 2013-11-02 NOTE — Addendum Note (Signed)
Addended by: Virl Cagey on: 11/02/2013 09:27 AM   Modules accepted: Orders

## 2013-11-03 ENCOUNTER — Telehealth: Payer: Self-pay | Admitting: Pulmonary Disease

## 2013-11-03 NOTE — Telephone Encounter (Signed)
LMOM x 1 - cxr results Notes Recorded by Kathee Delton, MD on 11/02/2013 at 5:20 PM Please let pt know the radiologist saw a small spot in his right lung, and he will need a ct scan to look at closer. Let me know if he is agreeable.

## 2013-11-04 ENCOUNTER — Other Ambulatory Visit: Payer: Self-pay | Admitting: Pulmonary Disease

## 2013-11-04 DIAGNOSIS — R918 Other nonspecific abnormal finding of lung field: Secondary | ICD-10-CM | POA: Insufficient documentation

## 2013-11-04 DIAGNOSIS — R911 Solitary pulmonary nodule: Secondary | ICD-10-CM

## 2013-11-04 NOTE — Telephone Encounter (Signed)
Order sent to [facility].  

## 2013-11-04 NOTE — Telephone Encounter (Signed)
I called the pt to advise of results. He states he spoke with someone yesterday.   Notes Recorded by Virl Cagey, CMA on 11/03/2013 at 4:08 PM Pt okay with doing CT Scan. Pt cannot do CT at all this week. Pt states that he available pretty much everyday except he will be unavailable 11/11/13.  I will forward message to [facility] so he is aware pt is ok to have scan and order needs to be placed. Hosston Bing, CMA

## 2013-11-08 ENCOUNTER — Ambulatory Visit (INDEPENDENT_AMBULATORY_CARE_PROVIDER_SITE_OTHER): Payer: Commercial Managed Care - HMO | Admitting: Interventional Cardiology

## 2013-11-08 ENCOUNTER — Encounter: Payer: Self-pay | Admitting: Interventional Cardiology

## 2013-11-08 VITALS — BP 128/80 | HR 58 | Ht 73.0 in | Wt 230.0 lb

## 2013-11-08 DIAGNOSIS — E785 Hyperlipidemia, unspecified: Secondary | ICD-10-CM

## 2013-11-08 DIAGNOSIS — I10 Essential (primary) hypertension: Secondary | ICD-10-CM

## 2013-11-08 DIAGNOSIS — I2510 Atherosclerotic heart disease of native coronary artery without angina pectoris: Secondary | ICD-10-CM | POA: Insufficient documentation

## 2013-11-08 DIAGNOSIS — J449 Chronic obstructive pulmonary disease, unspecified: Secondary | ICD-10-CM

## 2013-11-08 NOTE — Patient Instructions (Signed)
Your physician has recommended you make the following change in your medication:  1) STOP Plavix  Take all other medications as prescribed  Your physician wants you to follow-up in: 1 year You will receive a reminder letter in the mail two months in advance. If you don't receive a letter, please call our office to schedule the follow-up appointment.

## 2013-11-08 NOTE — Progress Notes (Signed)
Patient ID: Samuel Watkins, male   DOB: 11-Mar-1946, 68 y.o.   MRN: [id number]    [address], [address], [place]  [address] Phone: [phone] Fax:  [phone]  Date:  11/08/2013   ID:  Samuel Watkins, DOB [birth year], MRN [id number]  PCP:  Marcello Fennel, MD   ASSESSMENT:  1. CAD with RCA and LAD drug-eluting stents, stable without angina with last procedure 2008 2. Hyperlipidemia on therapy 3. Blood pressure under control 4. COPD with dyspnea  PLAN:  1. Discontinue Plavix 2. Otherwise no change in therapy 3. Active lifestyle. Clinical followup in one year   SUBJECTIVE: Samuel Watkins is a 68 y.o. male who denies angina. No exertional cardiac symptoms. He does have dyspnea. He has significant COPD and is now being worked up for spinal lungs. Otherwise he is doing well. He denies orthopnea, PND, chest pain, and edema.   Wt Readings from Last 3 Encounters:  11/08/13 230 lb (104.327 kg)  11/02/13 232 lb 12.8 oz (105.597 kg)  11/02/12 230 lb 3.2 oz (104.418 kg)     Past Medical History  Diagnosis Date  . Chronic hepatitis B   . CAD (coronary artery disease)   . Hypertension   . Hyperlipemia     Current Outpatient Prescriptions  Medication Sig Dispense Refill  . albuterol (PROAIR HFA) 108 (90 BASE) MCG/ACT inhaler 2 puffs every 6 hrs as needed  1 Inhaler  6  . aspirin 81 MG tablet Take 81 mg by mouth daily.        Marland Kitchen atorvastatin (LIPITOR) 80 MG tablet Take 80 mg by mouth daily.        . clopidogrel (PLAVIX) 75 MG tablet Take 1 tablet (75 mg total) by mouth daily.  90 tablet  1  . fluticasone-salmeterol (ADVAIR HFA) 115-21 MCG/ACT inhaler INHALE 2 PUFFS BY MOUTH TWICE A DAY INTO THE LUNGS  3 Inhaler  0  . losartan (COZAAR) 50 MG tablet Take 50 mg by mouth daily.        . niacin (NIASPAN) 1000 MG CR tablet Take 1,000 mg by mouth at bedtime.        . nitroGLYCERIN (NITROSTAT) 0.4 MG SL tablet Place 0.4 mg under the tongue every 5 (five) minutes as needed.          No current facility-administered medications for this visit.    Allergies:   No Known Allergies  Social History:  The patient  reports that he quit smoking about 3 years ago. His smoking use included Cigarettes. He has a 50 pack-year smoking history. He has never used smokeless tobacco. He reports that he does not drink alcohol or use illicit drugs.   ROS:  Please see the history of present illness.   No blood in urine or stool. No neurological complaints   All other systems reviewed and negative.   OBJECTIVE: VS:  BP 128/80  Pulse 58  Ht 6\' 1"  (1.854 m)  Wt 230 lb (104.327 kg)  BMI 30.35 kg/m2 Well nourished, well developed, in no acute distress, older than stated age 38: normal Neck: JVD flat. Carotid bruit absent  Cardiac:  normal S1, S2; RRR; no murmur Lungs:  clear to auscultation bilaterally, no wheezing, rhonchi or rales Abd: soft, nontender, no hepatomegaly Ext: Edema absent. Pulses 2+ Skin: warm and dry Neuro:  CNs 2-12 intact, no focal abnormalities noted  EKG:  Right bundle with left axis deviation no significant change when compared to the  prior tracing. Sinus bradycardia is present       Signed, Illene Labrador III, MD 11/08/2013 11:19 AM

## 2013-11-14 ENCOUNTER — Inpatient Hospital Stay: Admission: RE | Admit: 2013-11-14 | Payer: Commercial Managed Care - HMO | Source: Ambulatory Visit

## 2013-11-16 ENCOUNTER — Ambulatory Visit (INDEPENDENT_AMBULATORY_CARE_PROVIDER_SITE_OTHER)
Admission: RE | Admit: 2013-11-16 | Discharge: 2013-11-16 | Disposition: A | Discharge status: Home / Self Care | Payer: Commercial Managed Care - HMO | Source: Ambulatory Visit | Attending: Pulmonary Disease | Admitting: Pulmonary Disease

## 2013-11-16 DIAGNOSIS — R911 Solitary pulmonary nodule: Secondary | ICD-10-CM

## 2013-11-21 ENCOUNTER — Other Ambulatory Visit: Payer: Self-pay | Admitting: Pulmonary Disease

## 2013-11-22 ENCOUNTER — Telehealth: Payer: Self-pay | Admitting: Pulmonary Disease

## 2013-11-22 DIAGNOSIS — R918 Other nonspecific abnormal finding of lung field: Secondary | ICD-10-CM

## 2013-11-22 MED ORDER — ALBUTEROL SULFATE HFA 108 (90 BASE) MCG/ACT IN AERS: INHALATION_SPRAY | RESPIRATORY_TRACT | Status: DC

## 2013-11-22 NOTE — Telephone Encounter (Signed)
Notes Recorded by Kathee Delton, MD on 11/20/2013 at 11:50 AM Please let pt know that he has 3 tiny spots in right lung about the size of a BB. To small to biopsy. Likely not a cancer, but needs a followup noncontrasted ct chest in 77mos in light of his smoking history. Can order if he is ok with this. ---  I spoke with patient about results and he verbalized understanding and had no questions. Order placed

## 2014-03-02 ENCOUNTER — Telehealth: Payer: Self-pay | Admitting: Pulmonary Disease

## 2014-03-02 MED ORDER — FLUTICASONE-SALMETEROL 115-21 MCG/ACT IN AERO: INHALATION_SPRAY | RESPIRATORY_TRACT | Status: DC

## 2014-03-02 NOTE — Telephone Encounter (Signed)
rx has been sent to the pharmacy per pts request.  i have called and spoke with pt and he is aware.  Pt requested that this be sent in for 1 month supply.

## 2014-04-06 ENCOUNTER — Other Ambulatory Visit: Payer: Self-pay | Admitting: Pulmonary Disease

## 2014-05-29 ENCOUNTER — Ambulatory Visit (INDEPENDENT_AMBULATORY_CARE_PROVIDER_SITE_OTHER)
Admission: RE | Admit: 2014-05-29 | Discharge: 2014-05-29 | Disposition: A | Discharge status: Home / Self Care | Payer: Commercial Managed Care - HMO | Source: Ambulatory Visit | Attending: Pulmonary Disease | Admitting: Pulmonary Disease

## 2014-05-29 DIAGNOSIS — R918 Other nonspecific abnormal finding of lung field: Secondary | ICD-10-CM

## 2014-05-31 ENCOUNTER — Telehealth: Payer: Self-pay | Admitting: Pulmonary Disease

## 2014-05-31 NOTE — Telephone Encounter (Signed)
i have already spoke with pt about results

## 2014-07-11 ENCOUNTER — Other Ambulatory Visit: Payer: Self-pay | Admitting: Pulmonary Disease

## 2014-07-19 DIAGNOSIS — J449 Chronic obstructive pulmonary disease, unspecified: Secondary | ICD-10-CM | POA: Diagnosis not present

## 2014-07-19 DIAGNOSIS — E119 Type 2 diabetes mellitus without complications: Secondary | ICD-10-CM | POA: Diagnosis not present

## 2014-07-19 DIAGNOSIS — Z125 Encounter for screening for malignant neoplasm of prostate: Secondary | ICD-10-CM | POA: Diagnosis not present

## 2014-07-19 DIAGNOSIS — I2510 Atherosclerotic heart disease of native coronary artery without angina pectoris: Secondary | ICD-10-CM | POA: Diagnosis not present

## 2014-07-19 DIAGNOSIS — K219 Gastro-esophageal reflux disease without esophagitis: Secondary | ICD-10-CM | POA: Diagnosis not present

## 2014-07-19 DIAGNOSIS — Z0001 Encounter for general adult medical examination with abnormal findings: Secondary | ICD-10-CM | POA: Diagnosis not present

## 2014-07-19 DIAGNOSIS — E559 Vitamin D deficiency, unspecified: Secondary | ICD-10-CM | POA: Diagnosis not present

## 2014-07-19 DIAGNOSIS — I10 Essential (primary) hypertension: Secondary | ICD-10-CM | POA: Diagnosis not present

## 2014-08-16 ENCOUNTER — Other Ambulatory Visit: Payer: Self-pay | Admitting: Pulmonary Disease

## 2014-10-12 DIAGNOSIS — E119 Type 2 diabetes mellitus without complications: Secondary | ICD-10-CM | POA: Diagnosis not present

## 2014-10-12 DIAGNOSIS — E1149 Type 2 diabetes mellitus with other diabetic neurological complication: Secondary | ICD-10-CM | POA: Diagnosis not present

## 2014-10-12 DIAGNOSIS — R252 Cramp and spasm: Secondary | ICD-10-CM | POA: Diagnosis not present

## 2014-10-12 DIAGNOSIS — R634 Abnormal weight loss: Secondary | ICD-10-CM | POA: Diagnosis not present

## 2014-10-26 DIAGNOSIS — E1149 Type 2 diabetes mellitus with other diabetic neurological complication: Secondary | ICD-10-CM | POA: Diagnosis not present

## 2014-11-03 ENCOUNTER — Ambulatory Visit (INDEPENDENT_AMBULATORY_CARE_PROVIDER_SITE_OTHER): Payer: Commercial Managed Care - HMO | Admitting: Pulmonary Disease

## 2014-11-03 ENCOUNTER — Encounter: Payer: Self-pay | Admitting: Pulmonary Disease

## 2014-11-03 VITALS — BP 114/76 | HR 65 | Temp 98.0°F | Ht 73.0 in | Wt 205.4 lb

## 2014-11-03 DIAGNOSIS — J438 Other emphysema: Secondary | ICD-10-CM

## 2014-11-03 DIAGNOSIS — R918 Other nonspecific abnormal finding of lung field: Secondary | ICD-10-CM

## 2014-11-03 NOTE — Patient Instructions (Signed)
Continue on current medications Work on weight loss and conditioning. Will schedule for a followup scan of your chest in November, and will call with results followup with Dr. Lamonte Sakai in one year if doing well.

## 2014-11-03 NOTE — Assessment & Plan Note (Signed)
The patient is doing fairly well from a COPD standpoint. His maintenance medication has been changed from Advair to Symbicort by the [place], and he will continue on this for now. I've also stressed to him the importance of a weight loss and conditioning program.

## 2014-11-03 NOTE — Progress Notes (Signed)
   Subjective:    Patient ID: Samuel Watkins, male    DOB: [birth year], 69 y.o.   MRN: [id number]  HPI The patient comes in today for follow-up of his known COPD. He has been maintaining a stable baseline, and recently the [facility] changed his maintenance medication from Advair to Symbicort. He has been trying to stay as active as possible, and has not had a recent acute exacerbation.   Review of Systems  Constitutional: Negative for fever and unexpected weight change.  HENT: Negative for congestion, dental problem, ear pain, nosebleeds, postnasal drip, rhinorrhea, sinus pressure, sneezing, sore throat and trouble swallowing.   Eyes: Negative for redness and itching.  Respiratory: Negative for cough, chest tightness, shortness of breath and wheezing.   Cardiovascular: Negative for palpitations and leg swelling.  Gastrointestinal: Negative for nausea and vomiting.  Genitourinary: Negative for dysuria.  Musculoskeletal: Negative for joint swelling.  Skin: Negative for rash.  Neurological: Negative for headaches.  Hematological: Does not bruise/bleed easily.  Psychiatric/Behavioral: Negative for dysphoric mood. The patient is not nervous/anxious.        Objective:   Physical Exam Well-developed male in no acute distress Nose without purulence or discharge noted Neck without lymphadenopathy or thyromegaly Chest with mildly decreased breath sounds, no wheezing Cardiac exam with regular rate and rhythm Lower extremities without edema, no cyanosis Alert and oriented, moves all 4 extremities.       Assessment & Plan:

## 2014-11-03 NOTE — Assessment & Plan Note (Signed)
The patient has a history of pulmonary nodules, and is under a surveillance program currently. Need follow-up in November of this year, and will arrange for his appointment.

## 2014-11-08 ENCOUNTER — Ambulatory Visit: Payer: Commercial Managed Care - HMO | Admitting: Interventional Cardiology

## 2014-11-28 ENCOUNTER — Ambulatory Visit: Payer: Commercial Managed Care - HMO

## 2014-11-28 DIAGNOSIS — Z794 Long term (current) use of insulin: Secondary | ICD-10-CM | POA: Diagnosis not present

## 2014-11-28 DIAGNOSIS — E1149 Type 2 diabetes mellitus with other diabetic neurological complication: Secondary | ICD-10-CM | POA: Diagnosis not present

## 2014-12-05 ENCOUNTER — Ambulatory Visit: Payer: Commercial Managed Care - HMO

## 2014-12-12 ENCOUNTER — Ambulatory Visit: Payer: Commercial Managed Care - HMO

## 2014-12-18 ENCOUNTER — Ambulatory Visit (INDEPENDENT_AMBULATORY_CARE_PROVIDER_SITE_OTHER): Payer: Commercial Managed Care - HMO | Admitting: Interventional Cardiology

## 2014-12-18 ENCOUNTER — Encounter: Payer: Self-pay | Admitting: Interventional Cardiology

## 2014-12-18 VITALS — BP 124/74 | HR 55 | Ht 73.0 in | Wt 204.0 lb

## 2014-12-18 DIAGNOSIS — E785 Hyperlipidemia, unspecified: Secondary | ICD-10-CM | POA: Diagnosis not present

## 2014-12-18 DIAGNOSIS — I10 Essential (primary) hypertension: Secondary | ICD-10-CM

## 2014-12-18 DIAGNOSIS — I2510 Atherosclerotic heart disease of native coronary artery without angina pectoris: Secondary | ICD-10-CM | POA: Diagnosis not present

## 2014-12-18 NOTE — Progress Notes (Signed)
Cardiology Office Note   Date:  12/18/2014   ID:  Samuel Watkins, DOB 07-08-1946, MRN [id number]  PCP:  Marcello Fennel, MD  Cardiologist:  Sinclair Grooms, MD   Chief Complaint  Patient presents with  . Coronary Artery Disease      History of Present Illness: Samuel Watkins is a 69 y.o. male who presents for coronary artery disease, history of bare metal stent RCA and drug-eluting stent RCA 2001 in 2008 respectively, and LAD DES 2008.  Patient is doing well. He is now followed at the [facility] in [place]. [facility] He no longer sees Dr. Melinda Crutch. He has not had syncope. He denies orthopnea PND. There is no lower extremity edema. He has no claudication. There is some dyspnea on exertion but no wheezing. There is no orthopnea.    Past Medical History  Diagnosis Date  . Chronic hepatitis B   . CAD (coronary artery disease)   . Hypertension   . Hyperlipemia     Past Surgical History  Procedure Laterality Date  . Lumbar disc surgery  1997  . Lung surgery      found spot on lung 10 years ago     Current Outpatient Prescriptions  Medication Sig Dispense Refill  . albuterol (PROVENTIL HFA;VENTOLIN HFA) 108 (90 BASE) MCG/ACT inhaler Inhale 2 puffs into the lungs 4 (four) times daily as needed for wheezing or shortness of breath.    Marland Kitchen aspirin 81 MG tablet Take 81 mg by mouth daily.      Marland Kitchen atorvastatin (LIPITOR) 80 MG tablet Take 80 mg by mouth daily.      . budesonide-formoterol (SYMBICORT) 160-4.5 MCG/ACT inhaler Inhale 2 puffs into the lungs 2 (two) times daily.    . insulin aspart (NOVOLOG) 100 UNIT/ML FlexPen Take as directed    . losartan (COZAAR) 50 MG tablet Take 50 mg by mouth daily.      . metFORMIN (GLUCOPHAGE) 500 MG tablet Take 500 mg by mouth daily.    . niacin (NIASPAN) 1000 MG CR tablet Take 1,000 mg by mouth at bedtime.      . nitroGLYCERIN (NITROSTAT) 0.4 MG SL tablet Place 0.4 mg under the tongue  every 5 (five) minutes as needed.       No current facility-administered medications for this visit.    Allergies:   Review of patient's allergies indicates no known allergies.    Social History:  The patient  reports that he quit smoking about 4 years ago. His smoking use included Cigarettes. He has a 50 pack-year smoking history. He has never used smokeless tobacco. He reports that he does not drink alcohol or use illicit drugs.   Family History:  The patient's family history includes Heart disease in his father and mother; Throat cancer in his brother.    ROS:  Please see the history of present illness.   Otherwise, review of systems are positive for wheezing if he does not use Symbicort. Otherwise no limitations..   All other systems are reviewed and negative.    PHYSICAL EXAM: VS:  BP 124/74 mmHg  Pulse 55  Ht 6\' 1"  (1.854 m)  Wt 204 lb (92.534 kg)  BMI 26.92 kg/m2 , BMI Body mass index is 26.92 kg/(m^2). GEN: Well nourished, well developed, in no acute distress HEENT: normal Neck: no JVD, carotid bruits, or masses Cardiac: RRR; no murmurs, rubs, or gallops,no edema  Respiratory:  clear  to auscultation bilaterally, normal work of breathing GI: soft, nontender, nondistended, + BS MS: no deformity or atrophy Skin: warm and dry, no rash Neuro:  Strength and sensation are intact Psych: euthymic mood, full affect   EKG:  EKG is ordered today. The ekg ordered today demonstrates SB with RBBB and RAD   Recent Labs: No results found for requested labs within last 365 days.    Lipid Panel    Component Value Date/Time   CHOL  07/10/2007 0345    189        ATP III CLASSIFICATION:  <200     mg/dL   Desirable  200-239  mg/dL   Borderline High  >=240    mg/dL   High   TRIG 105 07/10/2007 0345   HDL 18* 07/10/2007 0345   CHOLHDL 10.5 07/10/2007 0345   VLDL 21 07/10/2007 0345   LDLCALC * 07/10/2007 0345    150        Total Cholesterol/HDL:CHD Risk Coronary Heart Disease  Risk Table                     Men   Women  1/2 Average Risk   3.4   3.3      Wt Readings from Last 3 Encounters:  12/18/14 204 lb (92.534 kg)  11/03/14 205 lb 6.4 oz (93.169 kg)  11/08/13 230 lb (104.327 kg)      Other studies Reviewed: Additional studies/ records that were reviewed today include: None. Review of the above records demonstrates:    ASSESSMENT AND PLAN:  CAD in native artery - stable without angina  Essential hypertension - controlled  Hyperlipidemia- [facility] by the [facility]     Current medicines are reviewed at length with the patient today.  The patient does not have concerns regarding medicines.  The following changes have been made:  no change  Labs/ tests ordered today include:   Orders Placed This Encounter  Procedures  . EKG 12-Lead     Disposition:   FU with HS in 1 year  Signed, Sinclair Grooms, MD  12/18/2014 9:41 AM    [facility] [address], [place], [place]  [address] Phone: [phone]; Fax: [phone]

## 2014-12-18 NOTE — Patient Instructions (Signed)
Medication Instructions:  Your physician recommends that you continue on your current medications as directed. Please refer to the Current Medication list given to you today.   Labwork: None   Testing/Procedures: None   Follow-Up: Your physician wants you to follow-up in: 1 year with Dr.Smith You will receive a reminder letter in the mail two months in advance. If you don't receive a letter, please call our office to schedule the follow-up appointment.   Any Other Special Instructions Will Be Listed Below (If Applicable). If you have any recent paperwork/office visit/testing from the [place]. Please forward our office a copy, so that we can stay updated on your care

## 2015-03-06 DIAGNOSIS — E1149 Type 2 diabetes mellitus with other diabetic neurological complication: Secondary | ICD-10-CM | POA: Diagnosis not present

## 2015-03-06 DIAGNOSIS — Z794 Long term (current) use of insulin: Secondary | ICD-10-CM | POA: Diagnosis not present

## 2015-04-30 ENCOUNTER — Other Ambulatory Visit: Payer: Self-pay | Admitting: Emergency Medicine

## 2015-04-30 DIAGNOSIS — R918 Other nonspecific abnormal finding of lung field: Secondary | ICD-10-CM

## 2015-05-29 ENCOUNTER — Other Ambulatory Visit: Payer: Commercial Managed Care - HMO

## 2015-05-29 ENCOUNTER — Ambulatory Visit (INDEPENDENT_AMBULATORY_CARE_PROVIDER_SITE_OTHER)
Admission: RE | Admit: 2015-05-29 | Discharge: 2015-05-29 | Disposition: A | Discharge status: Home / Self Care | Payer: Commercial Managed Care - HMO | Source: Ambulatory Visit | Attending: Emergency Medicine | Admitting: Emergency Medicine

## 2015-05-29 ENCOUNTER — Inpatient Hospital Stay: Admission: RE | Admit: 2015-05-29 | Payer: Commercial Managed Care - HMO | Source: Ambulatory Visit

## 2015-05-29 DIAGNOSIS — R918 Other nonspecific abnormal finding of lung field: Secondary | ICD-10-CM | POA: Diagnosis not present

## 2015-05-29 DIAGNOSIS — R911 Solitary pulmonary nodule: Secondary | ICD-10-CM | POA: Diagnosis not present

## 2015-06-11 ENCOUNTER — Telehealth: Payer: Self-pay | Admitting: Emergency Medicine

## 2015-06-11 NOTE — Telephone Encounter (Signed)
Please tell the patient that his CT scan of the chest shows that his pulmonary nodules are stable. This is good news. We can discuss the timing of any repeat scans at his next office visit.

## 2015-06-11 NOTE — Telephone Encounter (Signed)
I spoke with patient about results and he verbalized understanding and had no questions 

## 2015-06-11 NOTE — Telephone Encounter (Signed)
Pt calling requesting his CT results from 11/15. Please advise RB thanks

## 2015-08-15 ENCOUNTER — Ambulatory Visit (INDEPENDENT_AMBULATORY_CARE_PROVIDER_SITE_OTHER): Payer: Commercial Managed Care - HMO | Admitting: Emergency Medicine

## 2015-08-15 ENCOUNTER — Encounter: Payer: Self-pay | Admitting: Emergency Medicine

## 2015-08-15 VITALS — BP 114/82 | HR 96 | Ht 73.0 in | Wt 228.0 lb

## 2015-08-15 DIAGNOSIS — Z87891 Personal history of nicotine dependence: Secondary | ICD-10-CM | POA: Diagnosis not present

## 2015-08-15 DIAGNOSIS — J438 Other emphysema: Secondary | ICD-10-CM | POA: Diagnosis not present

## 2015-08-15 DIAGNOSIS — Z122 Encounter for screening for malignant neoplasm of respiratory organs: Secondary | ICD-10-CM

## 2015-08-15 DIAGNOSIS — R918 Other nonspecific abnormal finding of lung field: Secondary | ICD-10-CM | POA: Diagnosis not present

## 2015-08-15 NOTE — Patient Instructions (Addendum)
Please continue Advair as you have been taking it We will refer you to the low-dose CT scan lung cancer screening program. You will need a CT scan of her chest in November 2017.  Follow with Dr Lamonte Sakai  In November after her CT scan is performed or sooner if you have any new problems.

## 2015-08-15 NOTE — Assessment & Plan Note (Signed)
Clinical stable. He is going to follow also with Dr. Gwenette Greet at the [facility]. He has been tolerating Advair and will continue this medication. He never uses albuterol.

## 2015-08-15 NOTE — Progress Notes (Signed)
Subjective:    Patient ID: Samuel Watkins, male    DOB: 08/26/1945, 70 y.o.   MRN: [id number]  HPI 70 yo man, hx tobacco (50+ pk-yrs), COPD followed by Dr Gwenette Greet. Managed on Advair HFA and is doing well. No flares. No new sx. He had a CT chest 11/16 that showed stability of 37mm RUL nodule over 18 months. He is doing well. Describes no new sx except for exertional dyspnea.    Review of Systems  Past Medical History  Diagnosis Date  . Chronic hepatitis B ([place])   . CAD (coronary artery disease)   . Hypertension   . Hyperlipemia      Family History  Problem Relation Age of Onset  . Throat cancer Brother   . Heart disease Father   . Heart disease Mother      Social History   Social History  . Marital Status: Married    Spouse Name: N/A  . Number of Children: 3  . Years of Education: N/A   Occupational History  . retired    Social History Main Topics  . Smoking status: Former Smoker -- 1.00 packs/day for 50 years    Types: Cigarettes    Quit date: 07/14/2010  . Smokeless tobacco: Never Used  . Alcohol Use: No  . Drug Use: No  . Sexual Activity: Not on file   Other Topics Concern  . 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   No Known Allergies   Outpatient Prescriptions Prior to Visit  Medication Sig Dispense Refill  . albuterol (PROVENTIL HFA;VENTOLIN HFA) 108 (90 BASE) MCG/ACT inhaler Inhale 2 puffs into the lungs 4 (four) times daily as needed for wheezing or shortness of breath.    Marland Kitchen aspirin 81 MG tablet Take 81 mg by mouth daily.      Marland Kitchen atorvastatin (LIPITOR) 80 MG tablet Take 80 mg by mouth daily.      . budesonide-formoterol (SYMBICORT) 160-4.5 MCG/ACT inhaler Inhale 2 puffs into the lungs 2 (two) times daily.    . insulin aspart (NOVOLOG) 100 UNIT/ML FlexPen Take as directed    . losartan (COZAAR) 50 MG tablet Take 50 mg by mouth daily.       . metFORMIN (GLUCOPHAGE) 500 MG tablet Take 500 mg by mouth daily.    . niacin (NIASPAN) 1000 MG CR tablet Take 1,000 mg by mouth at bedtime.      . nitroGLYCERIN (NITROSTAT) 0.4 MG SL tablet Place 0.4 mg under the tongue every 5 (five) minutes as needed.       No facility-administered medications prior to visit.         Objective:   Physical Exam Filed Vitals:   08/15/15 1535  BP: 114/82  Pulse: 96  Height: 6\' 1"  (1.854 m)  Weight: 228 lb (103.42 kg)  SpO2: 91%   Gen: Pleasant, well-nourished, in no distress,  normal affect  ENT: No lesions,  mouth clear,  oropharynx clear, no postnasal drip  Neck: No JVD, no TMG, no carotid bruits  Lungs: No use of accessory muscles, clear without rales or rhonchi  Cardiovascular: RRR, heart sounds normal, no murmur or gallops, no peripheral edema  Musculoskeletal: No deformities, no cyanosis or clubbing  Neuro: alert, non focal  Skin: Warm, no lesions or rashes      Assessment & Plan:  COPD (  chronic obstructive pulmonary disease) Clinical stable. He is going to follow also with Dr. Gwenette Greet at the [facility]. He has been tolerating Advair and will continue this medication. He never uses albuterol.  Pulmonary nodules I personally reviewed his CT scan of the chest from November 2016. This shows stability of his right upper lobe nodule over 18 months. His other nodules are benign based on Fleischer criteria. We'll refer him to the low-dose CT scan lung cancer screening program with plans to repeat his scan in November 2017

## 2015-08-15 NOTE — Assessment & Plan Note (Signed)
I personally reviewed his CT scan of the chest from November 2016. This shows stability of his right upper lobe nodule over 18 months. His other nodules are benign based on Fleischer criteria. We'll refer him to the low-dose CT scan lung cancer screening program with plans to repeat his scan in November 2017

## 2015-09-05 DIAGNOSIS — E1149 Type 2 diabetes mellitus with other diabetic neurological complication: Secondary | ICD-10-CM | POA: Diagnosis not present

## 2015-09-05 DIAGNOSIS — I10 Essential (primary) hypertension: Secondary | ICD-10-CM | POA: Diagnosis not present

## 2015-09-12 ENCOUNTER — Other Ambulatory Visit: Payer: Self-pay | Admitting: Acute Care

## 2015-09-12 DIAGNOSIS — Z87891 Personal history of nicotine dependence: Secondary | ICD-10-CM

## 2015-09-24 ENCOUNTER — Encounter: Payer: Self-pay | Admitting: Acute Care

## 2015-09-24 ENCOUNTER — Ambulatory Visit (INDEPENDENT_AMBULATORY_CARE_PROVIDER_SITE_OTHER): Payer: Commercial Managed Care - HMO | Admitting: Acute Care

## 2015-09-24 ENCOUNTER — Other Ambulatory Visit: Payer: Self-pay | Admitting: Acute Care

## 2015-09-24 ENCOUNTER — Ambulatory Visit: Admission: RE | Admit: 2015-09-24 | Payer: Commercial Managed Care - HMO | Source: Ambulatory Visit

## 2015-09-24 DIAGNOSIS — Z87891 Personal history of nicotine dependence: Secondary | ICD-10-CM

## 2015-09-24 NOTE — Progress Notes (Signed)
Shared Decision Making Visit Lung Cancer Screening Program [id number])   Eligibility:  Age 70 y.o.  Pack Years Smoking History Calculation 45 pack year smoking history (# packs/per year x # years smoked)  Recent History of coughing up blood  no  Unexplained weight loss? no ( >Than 15 pounds within the last 6 months )  Prior History Lung / other cancer no (Diagnosis within the last 5 years already requiring surveillance chest CT Scans).  Smoking Status Former Smoker  Former Smokers: Years since quit: 5 years  Quit [id number]  Visit Components:  Discussion included one or more decision making aids. yes  Discussion included risk/benefits of screening. yes  Discussion included potential follow up diagnostic testing for abnormal scans. yes  Discussion included meaning and risk of over diagnosis. yes  Discussion included meaning and risk of False Positives. yes  Discussion included meaning of total radiation exposure. yes  Counseling Included:  Importance of adherence to annual lung cancer LDCT screening. yes  Impact of comorbidities on ability to participate in the program. yes  Ability and willingness to under diagnostic treatment. yes  Smoking Cessation Counseling:  Current Smokers:   Discussed importance of smoking cessation. NA; patient is a former smoker  Information about tobacco cessation classes and interventions provided to patient. yes  Patient provided with "ticket" for LDCT Scan. yes  Symptomatic Patient. no  Counseling: NA Former smoker  Diagnosis Code: Tobacco Use Z72.0  Asymptomatic Patient yes  Counseling NA: former smoker  Former Smokers:   Discussed the importance of maintaining cigarette abstinence. yes  Diagnosis Code: Personal History of Nicotine Dependence. [id number]  Information about tobacco cessation classes and interventions provided to patient. Yes  Patient provided with "ticket" for LDCT Scan. yes  Written Order for Lung Cancer  Screening with LDCT placed in Epic. Yes (CT Chest Lung Cancer Screening Low Dose W/O CM) [id number] Z12.2-Screening of respiratory organs Z87.891-Personal history of nicotine dependence  I spent 15 minutes of face to face time with Mr. Scherr discussing the risks and benefits of lung cancer screening. We viewed a power point together that explained in detail the above noted topics. We took the time to pause the power point at intervals to allow for questions to be asked and answered to ensure understanding. We discussed that he had taken the single most powerful action possible to decrease his risk of developing lung cancer when he quit smoking. I counseled him to remain smoke free, and to contact me if he ever had the desire to smoke again so that I can provide resources and tools to help support the effort to remain smoke free. We discussed the time and location of the scan, and that either [facility] or I will call with the results within  24-48 hours of receiving them. He has my card and contact information in the event he needs to speak with me, in addition to a copy of the power point we reviewed as a resource. He verbalized understanding of all of the above and had no further questions upon leaving the office. We will schedule his next scan for 05/2016. He verbalized understanding of this.    Magdalen Spatz, NP  09/24/2015

## 2015-10-03 ENCOUNTER — Other Ambulatory Visit: Payer: Self-pay | Admitting: Acute Care

## 2015-10-03 DIAGNOSIS — Z87891 Personal history of nicotine dependence: Secondary | ICD-10-CM

## 2015-10-24 DIAGNOSIS — Z7984 Long term (current) use of oral hypoglycemic drugs: Secondary | ICD-10-CM | POA: Diagnosis not present

## 2015-10-24 DIAGNOSIS — R251 Tremor, unspecified: Secondary | ICD-10-CM | POA: Diagnosis not present

## 2015-10-24 DIAGNOSIS — R0602 Shortness of breath: Secondary | ICD-10-CM | POA: Diagnosis not present

## 2015-10-24 DIAGNOSIS — E1149 Type 2 diabetes mellitus with other diabetic neurological complication: Secondary | ICD-10-CM | POA: Diagnosis not present

## 2015-10-29 ENCOUNTER — Other Ambulatory Visit: Payer: Self-pay | Admitting: Family Medicine

## 2015-10-29 DIAGNOSIS — M549 Dorsalgia, unspecified: Secondary | ICD-10-CM | POA: Diagnosis not present

## 2015-10-29 DIAGNOSIS — R1010 Upper abdominal pain, unspecified: Secondary | ICD-10-CM | POA: Diagnosis not present

## 2015-10-29 DIAGNOSIS — E1149 Type 2 diabetes mellitus with other diabetic neurological complication: Secondary | ICD-10-CM | POA: Diagnosis not present

## 2015-10-29 DIAGNOSIS — E162 Hypoglycemia, unspecified: Secondary | ICD-10-CM | POA: Diagnosis not present

## 2015-10-29 DIAGNOSIS — R109 Unspecified abdominal pain: Secondary | ICD-10-CM

## 2015-11-05 ENCOUNTER — Ambulatory Visit
Admission: RE | Admit: 2015-11-05 | Discharge: 2015-11-05 | Disposition: A | Discharge status: Home / Self Care | Payer: Commercial Managed Care - HMO | Source: Ambulatory Visit | Attending: Family Medicine | Admitting: Family Medicine

## 2015-11-05 DIAGNOSIS — R109 Unspecified abdominal pain: Secondary | ICD-10-CM

## 2015-11-05 DIAGNOSIS — N281 Cyst of kidney, acquired: Secondary | ICD-10-CM | POA: Diagnosis not present

## 2015-12-11 DIAGNOSIS — M47812 Spondylosis without myelopathy or radiculopathy, cervical region: Secondary | ICD-10-CM | POA: Diagnosis not present

## 2015-12-11 DIAGNOSIS — M542 Cervicalgia: Secondary | ICD-10-CM | POA: Diagnosis not present

## 2015-12-11 DIAGNOSIS — E119 Type 2 diabetes mellitus without complications: Secondary | ICD-10-CM | POA: Diagnosis not present

## 2015-12-11 DIAGNOSIS — Z7984 Long term (current) use of oral hypoglycemic drugs: Secondary | ICD-10-CM | POA: Diagnosis not present

## 2015-12-26 DIAGNOSIS — M5412 Radiculopathy, cervical region: Secondary | ICD-10-CM | POA: Diagnosis not present

## 2016-04-01 DIAGNOSIS — Z7984 Long term (current) use of oral hypoglycemic drugs: Secondary | ICD-10-CM | POA: Diagnosis not present

## 2016-04-01 DIAGNOSIS — Z0000 Encounter for general adult medical examination without abnormal findings: Secondary | ICD-10-CM | POA: Diagnosis not present

## 2016-04-01 DIAGNOSIS — J449 Chronic obstructive pulmonary disease, unspecified: Secondary | ICD-10-CM | POA: Diagnosis not present

## 2016-04-01 DIAGNOSIS — R05 Cough: Secondary | ICD-10-CM | POA: Diagnosis not present

## 2016-04-01 DIAGNOSIS — E1149 Type 2 diabetes mellitus with other diabetic neurological complication: Secondary | ICD-10-CM | POA: Diagnosis not present

## 2016-05-29 ENCOUNTER — Inpatient Hospital Stay: Admission: RE | Admit: 2016-05-29 | Payer: Commercial Managed Care - HMO | Source: Ambulatory Visit

## 2016-05-30 ENCOUNTER — Ambulatory Visit (INDEPENDENT_AMBULATORY_CARE_PROVIDER_SITE_OTHER)
Admission: RE | Admit: 2016-05-30 | Discharge: 2016-05-30 | Disposition: A | Discharge status: Home / Self Care | Payer: Commercial Managed Care - HMO | Source: Ambulatory Visit | Attending: Acute Care | Admitting: Acute Care

## 2016-05-30 DIAGNOSIS — Z87891 Personal history of nicotine dependence: Secondary | ICD-10-CM

## 2016-06-02 ENCOUNTER — Telehealth: Payer: Self-pay | Admitting: Acute Care

## 2016-06-02 DIAGNOSIS — Z87891 Personal history of nicotine dependence: Secondary | ICD-10-CM

## 2016-06-02 NOTE — Telephone Encounter (Signed)
I have called Mr. Samuel Watkins with the results of his low-dose screening CT. I explained to him that his scan was read as a lung RADS category 2, nodules that are benign in appearance or behavior. Recommendation per radiology is for annual screening with low-dose chest CT without contrast in November 2018. I told Samuel Watkins that I would send a copy of this scan results to his primary care physician Dr. Melinda Crutch. Additionally I told him we will order and schedule his scan for November 2018. He verbalized understanding, and had no further questions at completion of the call.

## 2016-07-01 DIAGNOSIS — E1149 Type 2 diabetes mellitus with other diabetic neurological complication: Secondary | ICD-10-CM | POA: Diagnosis not present

## 2016-07-01 DIAGNOSIS — Z7984 Long term (current) use of oral hypoglycemic drugs: Secondary | ICD-10-CM | POA: Diagnosis not present

## 2016-07-01 DIAGNOSIS — Z794 Long term (current) use of insulin: Secondary | ICD-10-CM | POA: Diagnosis not present

## 2016-07-01 DIAGNOSIS — J449 Chronic obstructive pulmonary disease, unspecified: Secondary | ICD-10-CM | POA: Diagnosis not present

## 2016-07-21 ENCOUNTER — Encounter (HOSPITAL_COMMUNITY)
Admission: RE | Admit: 2016-07-21 | Discharge: 2016-07-21 | Disposition: A | Discharge status: Home / Self Care | Payer: Non-veteran care | Source: Ambulatory Visit | Attending: Pulmonary Disease | Admitting: Pulmonary Disease

## 2016-07-21 ENCOUNTER — Encounter (HOSPITAL_COMMUNITY): Payer: Self-pay

## 2016-07-21 VITALS — BP 151/79 | HR 81 | Resp 18 | Ht 73.25 in | Wt 238.1 lb

## 2016-07-21 DIAGNOSIS — R0600 Dyspnea, unspecified: Secondary | ICD-10-CM | POA: Insufficient documentation

## 2016-07-21 DIAGNOSIS — J439 Emphysema, unspecified: Secondary | ICD-10-CM

## 2016-07-21 NOTE — Progress Notes (Signed)
Samuel Watkins 71 y.o. male Pulmonary Rehab Orientation Note Patient arrived today in Cardiac and Pulmonary Rehab for orientation to Pulmonary Rehab. He ambulated from [employer] with little difficulty. He has not been prescribed oxygen for home or portable use. He is compliant nightly with his CPAP. Color good, skin warm and dry. Patient is oriented to time and place. Patient's medical history, psychosocial health, and medications reviewed. Psychosocial assessment reveals pt lives with their spouse. Pt is currently retired. Pt hobbies include woodworking and repairing/restoring antique furniture. He has an Customer service manager. Pt reports his stress level is low. Pt does not exhibit signs of depression. PHQ2/9 score 0/na. Pt shows good coping skills with positive outlook. He was offered emotional support and reassurance. Will continue to monitor and evaluate progress toward psychosocial goal(s) of remaining positive about his diagnosis and prognosis. Physical assessment reveals heart rate is normal, breath sounds clear to auscultation, no wheezes, rales, or rhonchi. Grip strength equal, strong. Distal pulses palpable. Mild pitting edema at sock line. Patient reports he does take medications as prescribed. Patient states he follows a Regular diet. The patient reports no specific efforts to gain or lose weight.. Patient's weight will be monitored closely. Demonstration and practice of PLB using pulse oximeter. Patient able to return demonstration satisfactorily. Safety and hand hygiene in the exercise area reviewed with patient. Patient voices understanding of the information reviewed. Department expectations discussed with patient and achievable goals were set. The patient shows enthusiasm about attending the program and we look forward to working with this nice gentleman. The patient is scheduled for a 6 min walk test on 07/24/16 and to begin exercise on 07/31/16 in the 1:30 class.   45 minutes was spent on a variety  of activities such as assessment of the patient, obtaining baseline data including height, weight, BMI, and grip strength, verifying medical history, allergies, and current medications, and teaching patient strategies for performing tasks with less respiratory effort with emphasis on pursed lip breathing.

## 2016-07-24 ENCOUNTER — Encounter (HOSPITAL_COMMUNITY): Payer: Self-pay | Admitting: *Deleted

## 2016-07-24 ENCOUNTER — Encounter (HOSPITAL_COMMUNITY)
Admission: RE | Admit: 2016-07-24 | Discharge: 2016-07-24 | Disposition: A | Discharge status: Home / Self Care | Payer: Non-veteran care | Source: Ambulatory Visit | Attending: Pulmonary Disease | Admitting: Pulmonary Disease

## 2016-07-24 DIAGNOSIS — R0600 Dyspnea, unspecified: Secondary | ICD-10-CM | POA: Diagnosis not present

## 2016-07-24 DIAGNOSIS — J439 Emphysema, unspecified: Secondary | ICD-10-CM

## 2016-07-24 NOTE — Progress Notes (Signed)
Pulmonary Individual Treatment Plan  Patient Details  Name: Samuel Watkins MRN: [id number] Date of Birth: 01/25/1946 Referring Provider:   April Manson Pulmonary Rehab Walk Test from 07/24/2016 in [facility]  Referring Provider  Dr. Gwenette Greet      Initial Encounter Date:  Flowsheet Row Pulmonary Rehab Walk Test from 07/24/2016 in [facility]  Date  07/24/16  Referring Provider  Dr. Gwenette Greet      Visit Diagnosis: Pulmonary emphysema, unspecified emphysema type ([place])  Patient's Home Medications on Admission:   Current Outpatient Prescriptions:  .  albuterol (PROVENTIL HFA;VENTOLIN HFA) 108 (90 BASE) MCG/ACT inhaler, Inhale 2 puffs into the lungs 4 (four) times daily as needed for wheezing or shortness of breath., Disp: , Rfl:  .  aspirin 325 MG tablet, Take 325 mg by mouth daily., Disp: , Rfl:  .  atorvastatin (LIPITOR) 80 MG tablet, Take 80 mg by mouth daily.  , Disp: , Rfl:  .  budesonide-formoterol (SYMBICORT) 160-4.5 MCG/ACT inhaler, Inhale 2 puffs into the lungs 2 (two) times daily., Disp: , Rfl:  .  losartan (COZAAR) 50 MG tablet, Take 50 mg by mouth daily.  , Disp: , Rfl:  .  metFORMIN (GLUCOPHAGE) 500 MG tablet, Take 500 mg by mouth 2 (two) times daily with a meal. , Disp: , Rfl:  .  niacin (NIASPAN) 1000 MG CR tablet, Take 1,000 mg by mouth at bedtime.  , Disp: , Rfl:  .  nitroGLYCERIN (NITROSTAT) 0.4 MG SL tablet, Place 0.4 mg under the tongue every 5 (five) minutes as needed.  , Disp: , Rfl:  .  pantoprazole (PROTONIX) 40 MG tablet, Take 40 mg by mouth daily., Disp: , Rfl:  .  Tiotropium Bromide-Olodaterol (STIOLTO RESPIMAT) 2.5-2.5 MCG/ACT AERS, Inhale 2 puffs into the lungs daily., Disp: , Rfl:   Past Medical History: Past Medical History:  Diagnosis Date  . CAD (coronary artery disease)   . Chronic hepatitis B ([place])   . Hyperlipemia   . Hypertension     Tobacco Use: History  Smoking Status  . Former  Smoker  . Packs/day: 1.00  . Years: 50.00  . Types: Cigarettes  . Quit date: 07/14/2010  Smokeless Tobacco  . Never Used    Comment: Counseled to remain smoke free.    Labs: Recent Review Flowsheet Data    Labs for ITP Cardiac and Pulmonary Rehab 07/09/2007 07/10/2007 07/17/2007   Cholestrol - 189 ATP III CLASSIFICATION: <200     mg/dL   Desirable 200-239  mg/dL   Borderline High >=240    mg/dL   High -   LDLCALC - 150 Total Cholesterol/HDL:CHD Risk Coronary Heart Disease Risk Table Men   Women 1/2 Average Risk   3.4   3.3(H) -   HDL - 18(L) -   Trlycerides - 105 -   HCO3 22.7 - 27.8(H)   TCO2 24 - 29      Capillary Blood Glucose: No results found for: GLUCAP   ADL UCSD:   Pulmonary Function Assessment:     Pulmonary Function Assessment - 07/21/16 1448      Breath   Bilateral Breath Sounds Clear   Shortness of Breath Limiting activity      Exercise Target Goals: Date: 07/24/16  Exercise Program Goal: Individual exercise prescription set with THRR, safety & activity barriers. Participant demonstrates ability to understand and report RPE using BORG scale, to self-measure pulse accurately, and to acknowledge the importance of the exercise  prescription.  Exercise Prescription Goal: Starting with aerobic activity 30 plus minutes a day, 3 days per week for initial exercise prescription. Provide home exercise prescription and guidelines that participant acknowledges understanding prior to discharge.  Activity Barriers & Risk Stratification:     Activity Barriers & Cardiac Risk Stratification - 07/21/16 1448      Activity Barriers & Cardiac Risk Stratification   Activity Barriers Shortness of Breath;Back Problems      6 Minute Walk:     6 Minute Walk    Row Name 07/24/16 1621         6 Minute Walk   Phase Initial     Distance 1300 feet     Walk Time 6 minutes     # of Rest Breaks 0     MPH 2.46     METS 2.84     RPE 12     Perceived Dyspnea  1      Symptoms Yes (comment)     Comments hip discomfort and calf tightness     Resting HR 90 bpm     Resting BP 120/80     Max Ex. HR 105 bpm     Max Ex. BP 158/80     2 Minute Post BP 130/80       Interval HR   Baseline HR 90     1 Minute HR 99     2 Minute HR 97     3 Minute HR 102     4 Minute HR 99     5 Minute HR 102     6 Minute HR 105     2 Minute Post HR 95     Interval Heart Rate? Yes       Interval Oxygen   Interval Oxygen? Yes     Baseline Oxygen Saturation % 94 %     Baseline Liters of Oxygen 0 L     1 Minute Oxygen Saturation % 92 %     1 Minute Liters of Oxygen 0 L     2 Minute Oxygen Saturation % 90 %     2 Minute Liters of Oxygen 0 L     3 Minute Oxygen Saturation % 91 %     3 Minute Liters of Oxygen 0 L     4 Minute Oxygen Saturation % 91 %     4 Minute Liters of Oxygen 0 L     5 Minute Oxygen Saturation % 90 %     5 Minute Liters of Oxygen 0 L     6 Minute Oxygen Saturation % 90 %     6 Minute Liters of Oxygen 0 L     2 Minute Post Oxygen Saturation % 95 %     2 Minute Post Liters of Oxygen 0 L        Initial Exercise Prescription:     Initial Exercise Prescription - 07/24/16 1600      Date of Initial Exercise RX and Referring Provider   Date 07/24/16   Referring Provider Dr. Gwenette Greet     Bike   Level 2  sci fit   Minutes 17     NuStep   Level 2   Minutes 17   METs 1.5     Track   Laps 10   Minutes 17     Prescription Details   Frequency (times per week) 2   Duration Progress to 45 minutes of aerobic exercise without signs/symptoms  of physical distress     Intensity   THRR 40-80% of Max Heartrate 61-122   Ratings of Perceived Exertion 11-13   Perceived Dyspnea 0-4     Progression   Progression Continue progressive overload as per policy without signs/symptoms or physical distress.     Resistance Training   Training Prescription Yes   Weight blue bands      Perform Capillary Blood Glucose checks as needed.  Exercise  Prescription Changes:   Exercise Comments:   Discharge Exercise Prescription (Final Exercise Prescription Changes):    Nutrition:  Target Goals: Understanding of nutrition guidelines, daily intake of sodium 1500mg , cholesterol 200mg , calories 30% from fat and 7% or less from saturated fats, daily to have 5 or more servings of fruits and vegetables.  Biometrics:     Pre Biometrics - 07/24/16 1621      Pre Biometrics   Grip Strength 44 kg       Nutrition Therapy Plan and Nutrition Goals:   Nutrition Discharge: Rate Your Plate Scores:   Psychosocial: Target Goals: Acknowledge presence or absence of depression, maximize coping skills, provide positive support system. Participant is able to verbalize types and ability to use techniques and skills needed for reducing stress and depression.  Initial Review & Psychosocial Screening:     Initial Psych Review & Screening - 07/21/16 [place]? Yes     Barriers   Psychosocial barriers to participate in program There are no identifiable barriers or psychosocial needs.     Screening Interventions   Interventions Encouraged to exercise      Quality of Life Scores:   PHQ-9: Recent Review Flowsheet Data    Depression screen [facility] 2/9 07/21/2016   Decreased Interest 0   Down, Depressed, Hopeless 0   PHQ - 2 Score 0      Psychosocial Evaluation and Intervention:     Psychosocial Evaluation - 07/21/16 1503      Psychosocial Evaluation & Interventions   Interventions Encouraged to exercise with the program and follow exercise prescription      Psychosocial Re-Evaluation:  Education: Education Goals: Education classes will be provided on a weekly basis, covering required topics. Participant will state understanding/return demonstration of topics presented.  Learning Barriers/Preferences:   Education Topics: Risk Factor Reduction:  -Group instruction that is supported by a  PowerPoint presentation. Instructor discusses the definition of a risk factor, different risk factors for pulmonary disease, and how the heart and lungs work together.     Nutrition for Pulmonary Patient:  -Group instruction provided by PowerPoint slides, verbal discussion, and written materials to support subject matter. The instructor gives an explanation and review of healthy diet recommendations, which includes a discussion on weight management, recommendations for fruit and vegetable consumption, as well as protein, fluid, caffeine, fiber, sodium, sugar, and alcohol. Tips for eating when patients are short of breath are discussed.   Pursed Lip Breathing:  -Group instruction that is supported by demonstration and informational handouts. Instructor discusses the benefits of pursed lip and diaphragmatic breathing and detailed demonstration on how to preform both.     Oxygen Safety:  -Group instruction provided by PowerPoint, verbal discussion, and written material to support subject matter. There is an overview of "What is Oxygen" and "Why do we need it".  Instructor also reviews how to create a safe environment for oxygen use, the importance of using oxygen as prescribed, and the risks of noncompliance. There is  a brief discussion on traveling with oxygen and resources the patient may utilize.   Oxygen Equipment:  -Group instruction provided by [facility] Staff utilizing handouts, written materials, and equipment demonstrations.   Signs and Symptoms:  -Group instruction provided by written material and verbal discussion to support subject matter. Warning signs and symptoms of infection, stroke, and heart attack are reviewed and when to call the physician/911 reinforced. Tips for preventing the spread of infection discussed.   Advanced Directives:  -Group instruction provided by verbal instruction and written material to support subject matter. Instructor reviews Advanced Directive laws  and proper instruction for filling out document.   Pulmonary Video:  -Group video education that reviews the importance of medication and oxygen compliance, exercise, good nutrition, pulmonary hygiene, and pursed lip and diaphragmatic breathing for the pulmonary patient.   Exercise for the Pulmonary Patient:  -Group instruction that is supported by a PowerPoint presentation. Instructor discusses benefits of exercise, core components of exercise, frequency, duration, and intensity of an exercise routine, importance of utilizing pulse oximetry during exercise, safety while exercising, and options of places to exercise outside of rehab.     Pulmonary Medications:  -Verbally interactive group education provided by instructor with focus on inhaled medications and proper administration.   Anatomy and Physiology of the Respiratory System and Intimacy:  -Group instruction provided by PowerPoint, verbal discussion, and written material to support subject matter. Instructor reviews respiratory cycle and anatomical components of the respiratory system and their functions. Instructor also reviews differences in obstructive and restrictive respiratory diseases with examples of each. Intimacy, Sex, and Sexuality differences are reviewed with a discussion on how relationships can change when diagnosed with pulmonary disease. Common sexual concerns are reviewed.   Knowledge Questionnaire Score:     Knowledge Questionnaire Score - 07/24/16 1625      Knowledge Questionnaire Score   Pre Score 9/13      Core Components/Risk Factors/Patient Goals at Admission:     Personal Goals and Risk Factors at Admission - 07/21/16 1457      Core Components/Risk Factors/Patient Goals on Admission    Weight Management Yes;Obesity   Intervention Obesity: Provide education and appropriate resources to help participant work on and attain dietary goals.;Weight Management/Obesity: Establish reasonable short term and  long term weight goals.   Improve shortness of breath with ADL's Yes   Intervention Provide education, individualized exercise plan and daily activity instruction to help decrease symptoms of SOB with activities of daily living.   Expected Outcomes Short Term: Achieves a reduction of symptoms when performing activities of daily living.   Develop more efficient breathing techniques such as purse lipped breathing and diaphragmatic breathing; and practicing self-pacing with activity Yes   Intervention Provide education, demonstration and support about specific breathing techniuqes utilized for more efficient breathing. Include techniques such as pursed lipped breathing, diaphragmatic breathing and self-pacing activity.   Expected Outcomes Short Term: Participant will be able to demonstrate and use breathing techniques as needed throughout daily activities.   Increase knowledge of respiratory medications and ability to use respiratory devices properly  Yes   Intervention Provide education and demonstration as needed of appropriate use of medications, inhalers, and oxygen therapy.   Expected Outcomes Short Term: Achieves understanding of medications use. Understands that oxygen is a medication prescribed by physician. Demonstrates appropriate use of inhaler and oxygen therapy.   Diabetes Yes   Intervention Provide education about signs/symptoms and action to take for hypo/hyperglycemia.;Provide education about proper nutrition, including hydration, and  aerobic/resistive exercise prescription along with prescribed medications to achieve blood glucose in normal ranges: Fasting glucose 65-99 mg/dL   Expected Outcomes Short Term: Participant verbalizes understanding of the signs/symptoms and immediate care of hyper/hypoglycemia, proper foot care and importance of medication, aerobic/resistive exercise and nutrition plan for blood glucose control.;Long Term: Attainment of HbA1C < 7%.      Core Components/Risk  Factors/Patient Goals Review:    Core Components/Risk Factors/Patient Goals at Discharge (Final Review):    ITP Comments:   Comments:

## 2016-07-31 ENCOUNTER — Encounter (HOSPITAL_COMMUNITY): Admission: RE | Admit: 2016-07-31 | Payer: Non-veteran care | Source: Ambulatory Visit

## 2016-08-05 ENCOUNTER — Encounter (HOSPITAL_COMMUNITY)
Admission: RE | Admit: 2016-08-05 | Discharge: 2016-08-05 | Disposition: A | Discharge status: Home / Self Care | Payer: Non-veteran care | Source: Ambulatory Visit | Attending: Pulmonary Disease | Admitting: Pulmonary Disease

## 2016-08-05 DIAGNOSIS — R0600 Dyspnea, unspecified: Secondary | ICD-10-CM | POA: Diagnosis not present

## 2016-08-05 NOTE — Progress Notes (Signed)
Pulmonary Individual Treatment Plan  Patient Details  Name: Samuel Watkins MRN: [id number] Date of Birth: 1945-12-17 Referring Provider:   April Manson Pulmonary Rehab Walk Test from 07/24/2016 in [place]  Referring Provider  Dr. Gwenette Greet      Initial Encounter Date:  Flowsheet Row Pulmonary Rehab Walk Test from 07/24/2016 in [place]  Date  07/24/16  Referring Provider  Dr. Gwenette Greet      Visit Diagnosis: No diagnosis found.  Patient's Home Medications on Admission:   Current Outpatient Prescriptions:  .  albuterol (PROVENTIL HFA;VENTOLIN HFA) 108 (90 BASE) MCG/ACT inhaler, Inhale 2 puffs into the lungs 4 (four) times daily as needed for wheezing or shortness of breath., Disp: , Rfl:  .  aspirin 325 MG tablet, Take 325 mg by mouth daily., Disp: , Rfl:  .  atorvastatin (LIPITOR) 80 MG tablet, Take 80 mg by mouth daily.  , Disp: , Rfl:  .  budesonide-formoterol (SYMBICORT) 160-4.5 MCG/ACT inhaler, Inhale 2 puffs into the lungs 2 (two) times daily., Disp: , Rfl:  .  losartan (COZAAR) 50 MG tablet, Take 50 mg by mouth daily.  , Disp: , Rfl:  .  metFORMIN (GLUCOPHAGE) 500 MG tablet, Take 500 mg by mouth 2 (two) times daily with a meal. , Disp: , Rfl:  .  niacin (NIASPAN) 1000 MG CR tablet, Take 1,000 mg by mouth at bedtime.  , Disp: , Rfl:  .  nitroGLYCERIN (NITROSTAT) 0.4 MG SL tablet, Place 0.4 mg under the tongue every 5 (five) minutes as needed.  , Disp: , Rfl:  .  pantoprazole (PROTONIX) 40 MG tablet, Take 40 mg by mouth daily., Disp: , Rfl:  .  Tiotropium Bromide-Olodaterol (STIOLTO RESPIMAT) 2.5-2.5 MCG/ACT AERS, Inhale 2 puffs into the lungs daily., Disp: , Rfl:   Past Medical History: Past Medical History:  Diagnosis Date  . CAD (coronary artery disease)   . Chronic hepatitis B ([place])   . Hyperlipemia   . Hypertension     Tobacco Use: History  Smoking Status  . Former Smoker  . Packs/day: 1.00  . Years:  50.00  . Types: Cigarettes  . Quit date: 07/14/2010  Smokeless Tobacco  . Never Used    Comment: Counseled to remain smoke free.    Labs: Recent Review Flowsheet Data    Labs for ITP Cardiac and Pulmonary Rehab 07/09/2007 07/10/2007 07/17/2007   Cholestrol - 189 ATP III CLASSIFICATION: <200     mg/dL   Desirable 200-239  mg/dL   Borderline High >=240    mg/dL   High -   LDLCALC - 150 Total Cholesterol/HDL:CHD Risk Coronary Heart Disease Risk Table Men   Women 1/2 Average Risk   3.4   3.3(H) -   HDL - 18(L) -   Trlycerides - 105 -   HCO3 22.7 - 27.8(H)   TCO2 24 - 29      Capillary Blood Glucose: No results found for: GLUCAP   ADL UCSD:     Pulmonary Assessment Scores    Row Name 07/24/16 1625         ADL UCSD   ADL Phase Entry     SOB Score total 18        Pulmonary Function Assessment:     Pulmonary Function Assessment - 07/21/16 1448      Breath   Bilateral Breath Sounds Clear   Shortness of Breath Limiting activity      Exercise Target Goals:  Exercise Program Goal: Individual exercise prescription set with THRR, safety & activity barriers. Participant demonstrates ability to understand and report RPE using BORG scale, to self-measure pulse accurately, and to acknowledge the importance of the exercise prescription.  Exercise Prescription Goal: Starting with aerobic activity 30 plus minutes a day, 3 days per week for initial exercise prescription. Provide home exercise prescription and guidelines that participant acknowledges understanding prior to discharge.  Activity Barriers & Risk Stratification:     Activity Barriers & Cardiac Risk Stratification - 07/21/16 1448      Activity Barriers & Cardiac Risk Stratification   Activity Barriers Shortness of Breath;Back Problems      6 Minute Walk:     6 Minute Walk    Row Name 07/24/16 1621         6 Minute Walk   Phase Initial     Distance 1300 feet     Walk Time 6 minutes     # of  Rest Breaks 0     MPH 2.46     METS 2.84     RPE 12     Perceived Dyspnea  1     Symptoms Yes (comment)     Comments hip discomfort and calf tightness     Resting HR 90 bpm     Resting BP 120/80     Max Ex. HR 105 bpm     Max Ex. BP 158/80     2 Minute Post BP 130/80       Interval HR   Baseline HR 90     1 Minute HR 99     2 Minute HR 97     3 Minute HR 102     4 Minute HR 99     5 Minute HR 102     6 Minute HR 105     2 Minute Post HR 95     Interval Heart Rate? Yes       Interval Oxygen   Interval Oxygen? Yes     Baseline Oxygen Saturation % 94 %     Baseline Liters of Oxygen 0 L     1 Minute Oxygen Saturation % 92 %     1 Minute Liters of Oxygen 0 L     2 Minute Oxygen Saturation % 90 %     2 Minute Liters of Oxygen 0 L     3 Minute Oxygen Saturation % 91 %     3 Minute Liters of Oxygen 0 L     4 Minute Oxygen Saturation % 91 %     4 Minute Liters of Oxygen 0 L     5 Minute Oxygen Saturation % 90 %     5 Minute Liters of Oxygen 0 L     6 Minute Oxygen Saturation % 90 %     6 Minute Liters of Oxygen 0 L     2 Minute Post Oxygen Saturation % 95 %     2 Minute Post Liters of Oxygen 0 L        Initial Exercise Prescription:     Initial Exercise Prescription - 07/24/16 1600      Date of Initial Exercise RX and Referring Provider   Date 07/24/16   Referring Provider Dr. Gwenette Greet     Bike   Level 2  sci fit   Minutes 17     NuStep   Level 2   Minutes 17   METs 1.5  Track   Laps 10   Minutes 17     Prescription Details   Frequency (times per week) 2   Duration Progress to 45 minutes of aerobic exercise without signs/symptoms of physical distress     Intensity   THRR 40-80% of Max Heartrate 61-122   Ratings of Perceived Exertion 11-13   Perceived Dyspnea 0-4     Progression   Progression Continue progressive overload as per policy without signs/symptoms or physical distress.     Resistance Training   Training Prescription Yes   Weight blue  bands      Perform Capillary Blood Glucose checks as needed.  Exercise Prescription Changes:     Exercise Prescription Changes    Row Name 08/05/16 1600             Exercise Review   Progression Yes         Response to Exercise   Blood Pressure (Admit) 120/66       Blood Pressure (Exercise) 144/70       Blood Pressure (Exit) 130/74       Heart Rate (Admit) 96 bpm       Heart Rate (Exercise) 109 bpm       Heart Rate (Exit) 104 bpm       Oxygen Saturation (Admit) 91 %       Oxygen Saturation (Exercise) 90 %       Oxygen Saturation (Exit) 90 %       Rating of Perceived Exertion (Exercise) 13       Perceived Dyspnea (Exercise) 1       Duration Progress to 45 minutes of aerobic exercise without signs/symptoms of physical distress       Intensity -  40-80% HRR         Resistance Training   Training Prescription Yes       Weight blue bands       Reps 10-12  10 minutes of strength training         Interval Training   Interval Training No         Bike   Level 2       Minutes 17         NuStep   Level 3       Minutes 17         Track   Laps 14       Minutes 17          Exercise Comments:     Exercise Comments    Row Name 08/05/16 [phone]           Exercise Comments Patient's first day of rehab is today          Discharge Exercise Prescription (Final Exercise Prescription Changes):     Exercise Prescription Changes - 08/05/16 1600      Exercise Review   Progression Yes     Response to Exercise   Blood Pressure (Admit) 120/66   Blood Pressure (Exercise) 144/70   Blood Pressure (Exit) 130/74   Heart Rate (Admit) 96 bpm   Heart Rate (Exercise) 109 bpm   Heart Rate (Exit) 104 bpm   Oxygen Saturation (Admit) 91 %   Oxygen Saturation (Exercise) 90 %   Oxygen Saturation (Exit) 90 %   Rating of Perceived Exertion (Exercise) 13   Perceived Dyspnea (Exercise) 1   Duration Progress to 45 minutes of aerobic exercise without signs/symptoms of physical  distress   Intensity --  40-80%  HRR     Resistance Training   Training Prescription Yes   Weight blue bands   Reps 10-12  10 minutes of strength training     Interval Training   Interval Training No     Bike   Level 2   Minutes 17     NuStep   Level 3   Minutes 17     Track   Laps 14   Minutes 17       Nutrition:  Target Goals: Understanding of nutrition guidelines, daily intake of sodium <157m, cholesterol <2031m calories 30% from fat and 7% or less from saturated fats, daily to have 5 or more servings of fruits and vegetables.  Biometrics:     Pre Biometrics - 07/24/16 1621      Pre Biometrics   Grip Strength 44 kg       Nutrition Therapy Plan and Nutrition Goals:   Nutrition Discharge: Rate Your Plate Scores:   Psychosocial: Target Goals: Acknowledge presence or absence of depression, maximize coping skills, provide positive support system. Participant is able to verbalize types and ability to use techniques and skills needed for reducing stress and depression.  Initial Review & Psychosocial Screening:     Initial Psych Review & Screening - 07/21/16 [place]     Barriers   Psychosocial barriers to participate in program There are no identifiable barriers or psychosocial needs.     Screening Interventions   Interventions Encouraged to exercise      Quality of Life Scores:     Quality of Life - 07/24/16 1626      Quality of Life Scores   Health/Function Pre 27.6 %   Socioeconomic Pre 30 %   Psych/Spiritual Pre 30 %   Family Pre 25.5 %   GLOBAL Pre 28.46 %      PHQ-9: Recent Review Flowsheet Data    [facility] 07/21/2016   Decreased Interest 0   Down, Depressed, Hopeless 0   PHQ - 2 Score 0      Psychosocial Evaluation and Intervention:     Psychosocial Evaluation - 07/21/16 1503      Psychosocial Evaluation & Interventions   Interventions Encouraged to exercise  with the program and follow exercise prescription      Psychosocial Re-Evaluation:     Psychosocial Re-Evaluation    [place] 08/05/16 1631             Psychosocial Re-Evaluation   Interventions Encouraged to attend Pulmonary Rehabilitation for the exercise       Comments no psychosocial issues identified       Continued Psychosocial Services Needed No         Education: Education Goals: Education classes will be provided on a weekly basis, covering required topics. Participant will state understanding/return demonstration of topics presented.  Learning Barriers/Preferences:   Education Topics: Risk Factor Reduction:  -Group instruction that is supported by a PowerPoint presentation. Instructor discusses the definition of a risk factor, different risk factors for pulmonary disease, and how the heart and lungs work together.     Nutrition for Pulmonary Patient:  -Group instruction provided by PowerPoint slides, verbal discussion, and written materials to support subject matter. The instructor gives an explanation and review of healthy diet recommendations, which includes a discussion on weight management, recommendations for fruit and vegetable consumption, as well as protein, fluid, caffeine, fiber, sodium, sugar, and alcohol. Tips  for eating when patients are short of breath are discussed.   Pursed Lip Breathing:  -Group instruction that is supported by demonstration and informational handouts. Instructor discusses the benefits of pursed lip and diaphragmatic breathing and detailed demonstration on how to preform both.     Oxygen Safety:  -Group instruction provided by PowerPoint, verbal discussion, and written material to support subject matter. There is an overview of "What is Oxygen" and "Why do we need it".  Instructor also reviews how to create a safe environment for oxygen use, the importance of using oxygen as prescribed, and the risks of noncompliance. There is a  brief discussion on traveling with oxygen and resources the patient may utilize.   Oxygen Equipment:  -Group instruction provided by [facility] Staff utilizing handouts, written materials, and equipment demonstrations.   Signs and Symptoms:  -Group instruction provided by written material and verbal discussion to support subject matter. Warning signs and symptoms of infection, stroke, and heart attack are reviewed and when to call the physician/911 reinforced. Tips for preventing the spread of infection discussed.   Advanced Directives:  -Group instruction provided by verbal instruction and written material to support subject matter. Instructor reviews Advanced Directive laws and proper instruction for filling out document.   Pulmonary Video:  -Group video education that reviews the importance of medication and oxygen compliance, exercise, good nutrition, pulmonary hygiene, and pursed lip and diaphragmatic breathing for the pulmonary patient.   Exercise for the Pulmonary Patient:  -Group instruction that is supported by a PowerPoint presentation. Instructor discusses benefits of exercise, core components of exercise, frequency, duration, and intensity of an exercise routine, importance of utilizing pulse oximetry during exercise, safety while exercising, and options of places to exercise outside of rehab.     Pulmonary Medications:  -Verbally interactive group education provided by instructor with focus on inhaled medications and proper administration.   Anatomy and Physiology of the Respiratory System and Intimacy:  -Group instruction provided by PowerPoint, verbal discussion, and written material to support subject matter. Instructor reviews respiratory cycle and anatomical components of the respiratory system and their functions. Instructor also reviews differences in obstructive and restrictive respiratory diseases with examples of each. Intimacy, Sex, and Sexuality differences are  reviewed with a discussion on how relationships can change when diagnosed with pulmonary disease. Common sexual concerns are reviewed.   Knowledge Questionnaire Score:     Knowledge Questionnaire Score - 07/24/16 1625      Knowledge Questionnaire Score   Pre Score 9/13      Core Components/Risk Factors/Patient Goals at Admission:     Personal Goals and Risk Factors at Admission - 07/21/16 1457      Core Components/Risk Factors/Patient Goals on Admission    Weight Management Yes;Obesity   Intervention Obesity: Provide education and appropriate resources to help participant work on and attain dietary goals.;Weight Management/Obesity: Establish reasonable short term and long term weight goals.   Improve shortness of breath with ADL's Yes   Intervention Provide education, individualized exercise plan and daily activity instruction to help decrease symptoms of SOB with activities of daily living.   Expected Outcomes Short Term: Achieves a reduction of symptoms when performing activities of daily living.   Develop more efficient breathing techniques such as purse lipped breathing and diaphragmatic breathing; and practicing self-pacing with activity Yes   Intervention Provide education, demonstration and support about specific breathing techniuqes utilized for more efficient breathing. Include techniques such as pursed lipped breathing, diaphragmatic breathing and self-pacing activity.  Expected Outcomes Short Term: Participant will be able to demonstrate and use breathing techniques as needed throughout daily activities.   Increase knowledge of respiratory medications and ability to use respiratory devices properly  Yes   Intervention Provide education and demonstration as needed of appropriate use of medications, inhalers, and oxygen therapy.   Expected Outcomes Short Term: Achieves understanding of medications use. Understands that oxygen is a medication prescribed by physician.  Demonstrates appropriate use of inhaler and oxygen therapy.   Diabetes Yes   Intervention Provide education about signs/symptoms and action to take for hypo/hyperglycemia.;Provide education about proper nutrition, including hydration, and aerobic/resistive exercise prescription along with prescribed medications to achieve blood glucose in normal ranges: Fasting glucose 65-99 mg/dL   Expected Outcomes Short Term: Participant verbalizes understanding of the signs/symptoms and immediate care of hyper/hypoglycemia, proper foot care and importance of medication, aerobic/resistive exercise and nutrition plan for blood glucose control.;Long Term: Attainment of HbA1C < 7%.      Core Components/Risk Factors/Patient Goals Review:      Goals and Risk Factor Review    Row Name 08/05/16 1630             Core Components/Risk Factors/Patient Goals Review   Personal Goals Review Weight Management/Obesity;Increase Strength and Stamina;Improve shortness of breath with ADL's;Diabetes;Develop more efficient breathing techniques such as purse lipped breathing and diaphragmatic breathing and practicing self-pacing with activity.;Increase knowledge of respiratory medications and ability to use respiratory devices properly.       Review First day of exercise, too early to see progress       Expected Outcomes should see progress in next 30 days          Core Components/Risk Factors/Patient Goals at Discharge (Final Review):      Goals and Risk Factor Review - 08/05/16 1630      Core Components/Risk Factors/Patient Goals Review   Personal Goals Review Weight Management/Obesity;Increase Strength and Stamina;Improve shortness of breath with ADL's;Diabetes;Develop more efficient breathing techniques such as purse lipped breathing and diaphragmatic breathing and practicing self-pacing with activity.;Increase knowledge of respiratory medications and ability to use respiratory devices properly.   Review First day of  exercise, too early to see progress   Expected Outcomes should see progress in next 30 days      ITP Comments:   Comments: ITP REVIEW Pt is making expected progress toward pulmonary rehab goals after completing 1 sessions. Recommend continued exercise, life style modification, education, and utilization of breathing techniques to increase stamina and strength and decrease shortness of breath with exertion.

## 2016-08-05 NOTE — Progress Notes (Signed)
Daily Session Note  Patient Details  Name: LADAVION SAVITZ MRN: [id number] Date of Birth: 09/22/1945 Referring Provider:   April Manson Pulmonary Rehab Walk Test from 07/24/2016 in [place]  Referring Provider  Dr. Gwenette Greet      Encounter Date: 08/05/2016  Check In:     Session Check In - 08/05/16 1524      Check-In   Location [facility]   Staff Present Su Hilt, MS, ACSM RCEP, Exercise Physiologist;Shaleena Crusoe Leonia Reeves, RN, BSN;Lisa Hughes, RN;Portia Rollene Rotunda, RN, BSN   Supervising physician immediately available to respond to emergencies [facility] immediately available   Physician(s) Dr. Wyline Copas   Medication changes reported     No   Fall or balance concerns reported    No   Warm-up and Cool-down Performed as group-led instruction   Resistance Training Performed Yes   VAD Patient? No     Pain Assessment   Currently in Pain? No/denies   Multiple Pain Sites No      Capillary Blood Glucose: No results found for this or any previous visit (from the past 24 hour(s)).      Exercise Prescription Changes - 08/05/16 1600      Exercise Review   Progression Yes     Response to Exercise   Blood Pressure (Admit) 120/66   Blood Pressure (Exercise) 144/70   Blood Pressure (Exit) 130/74   Heart Rate (Admit) 96 bpm   Heart Rate (Exercise) 109 bpm   Heart Rate (Exit) 104 bpm   Oxygen Saturation (Admit) 91 %   Oxygen Saturation (Exercise) 90 %   Oxygen Saturation (Exit) 90 %   Rating of Perceived Exertion (Exercise) 13   Perceived Dyspnea (Exercise) 1   Duration Progress to 45 minutes of aerobic exercise without signs/symptoms of physical distress   Intensity --  40-80% HRR     Resistance Training   Training Prescription Yes   Weight blue bands   Reps 10-12  10 minutes of strength training     Interval Training   Interval Training No     Bike   Level 2   Minutes 17     NuStep   Level 3   Minutes 17     Track    Laps 14   Minutes 17     Goals Met:  Exercise tolerated well Strength training completed today  Goals Unmet:  Not Applicable  Comments: Service time is from 1330 to 1505    Dr. Rush Farmer is Medical Director for Pulmonary Rehab at [facility].

## 2016-08-07 ENCOUNTER — Encounter (HOSPITAL_COMMUNITY)
Admission: RE | Admit: 2016-08-07 | Discharge: 2016-08-07 | Disposition: A | Discharge status: Home / Self Care | Payer: Non-veteran care | Source: Ambulatory Visit | Attending: Pulmonary Disease | Admitting: Pulmonary Disease

## 2016-08-07 VITALS — Wt 234.8 lb

## 2016-08-07 DIAGNOSIS — R0600 Dyspnea, unspecified: Secondary | ICD-10-CM | POA: Diagnosis not present

## 2016-08-07 DIAGNOSIS — J439 Emphysema, unspecified: Secondary | ICD-10-CM

## 2016-08-07 NOTE — Progress Notes (Signed)
Daily Session Note  Patient Details  Name: Samuel Watkins MRN: [id number] Date of Birth: 1946-05-04 Referring Provider:   April Manson Pulmonary Rehab Walk Test from 07/24/2016 in [place]  Referring Provider  Dr. Gwenette Greet      Encounter Date: 08/07/2016  Check In:     Session Check In - 08/07/16 1344      Check-In   Location [facility]   Staff Present Su Hilt, MS, ACSM RCEP, Exercise Physiologist;Lisa Ysidro Evert, RN;Ceciley Buist Rollene Rotunda, RN, BSN   Supervising physician immediately available to respond to emergencies [facility] immediately available   Physician(s) Dr. Algis Liming   Medication changes reported     No   Fall or balance concerns reported    No   Warm-up and Cool-down Performed as group-led instruction   Resistance Training Performed Yes   VAD Patient? No     Pain Assessment   Currently in Pain? No/denies   Multiple Pain Sites No      Capillary Blood Glucose: No results found for this or any previous visit (from the past 24 hour(s)).     POCT Glucose - 08/07/16 1610      POCT Blood Glucose   Pre-Exercise 163 mg/dL   Post-Exercise 170 mg/dL         Exercise Prescription Changes - 08/07/16 1607      Exercise Review   Progression Yes     Response to Exercise   Blood Pressure (Admit) 126/72   Blood Pressure (Exercise) 120/80   Blood Pressure (Exit) 120/68   Heart Rate (Admit) 99 bpm   Heart Rate (Exercise) 105 bpm   Heart Rate (Exit) 97 bpm   Oxygen Saturation (Admit) 89 %   Oxygen Saturation (Exercise) 92 %   Oxygen Saturation (Exit) 94 %   Rating of Perceived Exertion (Exercise) 11   Perceived Dyspnea (Exercise) 1   Duration Progress to 45 minutes of aerobic exercise without signs/symptoms of physical distress   Intensity --  40-80% HRR     Resistance Training   Training Prescription Yes   Weight blue bands   Reps 10-12  10 minutes of strength training     Interval Training   Interval Training No     Bike   Level 4   Minutes 17     NuStep   Level 4   Minutes 17   METs 1.6     Goals Met:  Exercise tolerated well Personal goals reviewed Strength training completed today  Goals Unmet:  Not Applicable  Comments: Service time is from 1330 to 1515   Dr. Rush Farmer is Medical Director for Pulmonary Rehab at [facility].

## 2016-08-12 ENCOUNTER — Encounter (HOSPITAL_COMMUNITY)
Admission: RE | Admit: 2016-08-12 | Discharge: 2016-08-12 | Disposition: A | Discharge status: Home / Self Care | Payer: Non-veteran care | Source: Ambulatory Visit | Attending: Pulmonary Disease | Admitting: Pulmonary Disease

## 2016-08-14 ENCOUNTER — Encounter (HOSPITAL_COMMUNITY)
Admission: RE | Admit: 2016-08-14 | Discharge: 2016-08-14 | Disposition: A | Discharge status: Home / Self Care | Payer: Non-veteran care | Source: Ambulatory Visit | Attending: Pulmonary Disease | Admitting: Pulmonary Disease

## 2016-08-14 VITALS — Wt 239.0 lb

## 2016-08-14 DIAGNOSIS — R0600 Dyspnea, unspecified: Secondary | ICD-10-CM | POA: Diagnosis present

## 2016-08-14 DIAGNOSIS — J439 Emphysema, unspecified: Secondary | ICD-10-CM

## 2016-08-14 NOTE — Progress Notes (Signed)
Daily Session Note  Patient Details  Name: Samuel Watkins MRN: [id number] Date of Birth: June 10, 1946 Referring Provider:   April Manson Pulmonary Rehab Walk Test from 07/24/2016 in [place]  Referring Provider  Dr. Gwenette Greet      Encounter Date: 08/14/2016  Check In:     Session Check In - 08/14/16 1356      Check-In   Location [facility]   Staff Present Su Hilt, MS, ACSM RCEP, Exercise Physiologist;Joan Leonia Reeves, RN, BSN   Supervising physician immediately available to respond to emergencies [facility] immediately available   Physician(s) Dr, Broadus John   Medication changes reported     No   Fall or balance concerns reported    No   Warm-up and Cool-down Performed as group-led instruction   Resistance Training Performed Yes   VAD Patient? No     Pain Assessment   Currently in Pain? No/denies   Multiple Pain Sites No      Capillary Blood Glucose: No results found for this or any previous visit (from the past 24 hour(s)).     POCT Glucose - 08/14/16 1558      POCT Blood Glucose   Pre-Exercise 147 mg/dL   Post-Exercise 137 mg/dL         Exercise Prescription Changes - 08/14/16 1500      Exercise Review   Progression Yes     Response to Exercise   Blood Pressure (Admit) 122/64   Blood Pressure (Exercise) 130/70   Blood Pressure (Exit) 140/80   Heart Rate (Admit) 89 bpm   Heart Rate (Exercise) 103 bpm   Heart Rate (Exit) 95 bpm   Oxygen Saturation (Admit) 91 %   Oxygen Saturation (Exercise) 88 %   Oxygen Saturation (Exit) 90 %   Rating of Perceived Exertion (Exercise) 13   Perceived Dyspnea (Exercise) 1   Duration Progress to 45 minutes of aerobic exercise without signs/symptoms of physical distress   Intensity THRR unchanged     Progression   Progression Continue to progress workloads to maintain intensity without signs/symptoms of physical distress.     Resistance Training   Training  Prescription Yes   Weight blue bands   Reps 10-12  10 minutes of strength training     Interval Training   Interval Training No     NuStep   Level 4   Minutes 17   METs 2.1     Track   Laps 16   Minutes 17     Goals Met:  Exercise tolerated well No report of cardiac concerns or symptoms Strength training completed today  Goals Unmet:  Not Applicable  Comments: Service time is from 1330 to 1500    Dr. Rush Farmer is Medical Director for Pulmonary Rehab at [facility]

## 2016-08-19 ENCOUNTER — Encounter (HOSPITAL_COMMUNITY)
Admission: RE | Admit: 2016-08-19 | Discharge: 2016-08-19 | Disposition: A | Discharge status: Home / Self Care | Payer: Non-veteran care | Source: Ambulatory Visit | Attending: Pulmonary Disease | Admitting: Pulmonary Disease

## 2016-08-19 VITALS — Wt 238.3 lb

## 2016-08-19 DIAGNOSIS — R0600 Dyspnea, unspecified: Secondary | ICD-10-CM | POA: Diagnosis not present

## 2016-08-19 DIAGNOSIS — J439 Emphysema, unspecified: Secondary | ICD-10-CM

## 2016-08-19 NOTE — Progress Notes (Signed)
Daily Session Note  Patient Details  Name: Samuel Watkins MRN: [id number] Date of Birth: 04/07/1946 Referring Provider:   April Manson Pulmonary Rehab Walk Test from 07/24/2016 in [place]  Referring Provider  Dr. Gwenette Greet      Encounter Date: 08/19/2016  Check In:     Session Check In - 08/19/16 1329      Check-In   Location [facility]   Staff Present Rosebud Poles, RN, BSN;Molly diVincenzo, MS, ACSM RCEP, Exercise Physiologist   Supervising physician immediately available to respond to emergencies [facility] immediately available   Physician(s) Dr. Cathlean Sauer   Medication changes reported     No   Fall or balance concerns reported    No   Warm-up and Cool-down Performed as group-led instruction   Resistance Training Performed Yes   VAD Patient? No     Pain Assessment   Currently in Pain? No/denies   Multiple Pain Sites No      Capillary Blood Glucose: No results found for this or any previous visit (from the past 24 hour(s)).     POCT Glucose - 08/19/16 1519      POCT Blood Glucose   Pre-Exercise 148 mg/dL   Post-Exercise 142 mg/dL         Exercise Prescription Changes - 08/19/16 1500      Exercise Review   Progression Yes     Response to Exercise   Blood Pressure (Admit) 112/60   Blood Pressure (Exercise) 152/76   Blood Pressure (Exit) 114/60   Heart Rate (Admit) 94 bpm   Heart Rate (Exercise) 133 bpm   Heart Rate (Exit) 99 bpm   Oxygen Saturation (Admit) 93 %   Oxygen Saturation (Exercise) 90 %   Oxygen Saturation (Exit) 92 %   Rating of Perceived Exertion (Exercise) 13   Perceived Dyspnea (Exercise) 1   Duration Progress to 45 minutes of aerobic exercise without signs/symptoms of physical distress   Intensity THRR unchanged     Progression   Progression Continue to progress workloads to maintain intensity without signs/symptoms of physical distress.     Resistance Training   Training  Prescription Yes   Weight blue bands   Reps 10-12  10 minutes of strength training     Interval Training   Interval Training No     Bike   Level 5   Minutes 17     NuStep   Level 5   Minutes 17   METs 2.4     Track   Laps 10   Minutes 17     Goals Met:  Exercise tolerated well No report of cardiac concerns or symptoms Strength training completed today  Goals Unmet:  Not Applicable  Comments: Service time is from 1330 to 1500    Dr. Rush Farmer is Medical Director for Pulmonary Rehab at [facility]

## 2016-08-21 ENCOUNTER — Encounter (HOSPITAL_COMMUNITY)
Admission: RE | Admit: 2016-08-21 | Discharge: 2016-08-21 | Disposition: A | Discharge status: Home / Self Care | Payer: Non-veteran care | Source: Ambulatory Visit | Attending: Pulmonary Disease | Admitting: Pulmonary Disease

## 2016-08-21 DIAGNOSIS — R0600 Dyspnea, unspecified: Secondary | ICD-10-CM | POA: Diagnosis not present

## 2016-08-21 NOTE — Progress Notes (Signed)
Marliss Czar 71 y.o. male Nutrition Note Spoke with pt. Pt is obese. There are several ways the pt can make his eating habits healthier. Pt's Rate Your Plate results reviewed with pt. Pt does not avoid salty food. Pt eats processed meats, chips and uses canned food.   The role of sodium in lung disease reviewed with pt. Pt is diabetic. Per discussion, pt switched from insulin to "pills" a month ago. Pt checks CBG's 2 times a day. Fasting CBG's reportedly 130 -148 mg/dL. Pt denies receiving any formal diabetes education and pt is not interested in DM education at this time. Pt is aware of DM basics due to his family h/o DM and he has a daughter with Type 1 DM. Pt does not recall what his last A1c was and stated "it was low." Pt expressed understanding of the information reviewed via feedback method.    No results found for: HGBA1C  Nutrition Diagnosis ? Food-and nutrition-related knowledge deficit related to lack of exposure to information as related to diagnosis of pulmonary disease ? Obesity related to excessive energy intake as evidenced by a BMI of 31.3  Nutrition Intervention ? Pt's individual nutrition plan and goals reviewed with pt. ? Benefits of adopting healthy eating habits discussed when pt's Rate Your Plate reviewed. ? Pt to attend the Nutrition and Lung Disease class ? Continual client-centered nutrition education by RD, as part of interdisciplinary care. Goal(s) 1. Increase consumption of fruit and vegetables by at least 1 serving of each/d 2. Pt to identify and limit food sources of sodium. 3. Identify food quantities necessary to achieve wt loss of  -2# per week to a goal wt loss of 2.7-10.9 kg (6-24 lb) at graduation from pulmonary rehab. 4. CBG's in the normal range or as close to normal as is safely possible. Monitor and Evaluate progress toward nutrition goal with team.   Derek Mound, M.Ed, RD, LDN, CDE 08/21/2016 3:18 PM

## 2016-08-21 NOTE — Progress Notes (Signed)
Daily Session Note  Patient Details  Name: Samuel Watkins MRN: [id number] Date of Birth: 1945-07-31 Referring Provider:   April Manson Pulmonary Rehab Walk Test from 07/24/2016 in [place]  Referring Provider  Dr. Gwenette Greet      Encounter Date: 08/21/2016  Check In:   Capillary Blood Glucose: No results found for this or any previous visit (from the past 24 hour(s)).      Exercise Prescription Changes - 08/21/16 1500      Response to Exercise   Blood Pressure (Admit) 140/70   Blood Pressure (Exercise) 150/80   Blood Pressure (Exit) 108/78   Heart Rate (Admit) 83 bpm   Heart Rate (Exercise) 93 bpm   Heart Rate (Exit) 96 bpm   Oxygen Saturation (Admit) 94 %   Oxygen Saturation (Exercise) 93 %   Oxygen Saturation (Exit) 93 %   Rating of Perceived Exertion (Exercise) 11   Perceived Dyspnea (Exercise) 0   Duration Progress to 45 minutes of aerobic exercise without signs/symptoms of physical distress   Intensity THRR unchanged     Progression   Progression Continue to progress workloads to maintain intensity without signs/symptoms of physical distress.     Resistance Training   Training Prescription Yes   Weight blue bands   Reps 10-12  10 minutes of strength training     Interval Training   Interval Training No     Bike   Level 5   Minutes 17     NuStep   Level 5   Minutes 17   METs 1.7     Goals Met:  Exercise tolerated well No report of cardiac concerns or symptoms Strength training completed today  Goals Unmet:  Not Applicable  Comments: Service time is from 1:30p to 3:30p. Patient attended education with Dr. Nelda Marseille.    Dr. Rush Farmer is Medical Director for Pulmonary Rehab at [facility].

## 2016-08-26 ENCOUNTER — Encounter (HOSPITAL_COMMUNITY)
Admission: RE | Admit: 2016-08-26 | Discharge: 2016-08-26 | Disposition: A | Discharge status: Home / Self Care | Payer: Non-veteran care | Source: Ambulatory Visit | Attending: Pulmonary Disease | Admitting: Pulmonary Disease

## 2016-08-26 VITALS — Wt 237.7 lb

## 2016-08-26 DIAGNOSIS — R0600 Dyspnea, unspecified: Secondary | ICD-10-CM | POA: Diagnosis not present

## 2016-08-26 DIAGNOSIS — J439 Emphysema, unspecified: Secondary | ICD-10-CM

## 2016-08-26 NOTE — Progress Notes (Signed)
Daily Session Note  Patient Details  Name: COLEBY YETT MRN: [id number] Date of Birth: [birth year] Referring Provider:   April Manson Pulmonary Rehab Walk Test from 07/24/2016 in [place]  Referring Provider  Dr. Gwenette Greet      Encounter Date: 08/26/2016  Check In:     Session Check In - 08/26/16 1503      Check-In   Location [facility]   Staff Present Su Hilt, MS, ACSM RCEP, Exercise Physiologist;Lovette Merta Ysidro Evert, RN   Supervising physician immediately available to respond to emergencies [facility] immediately available   Physician(s) Dr. Posey Pronto   Medication changes reported     No   Fall or balance concerns reported    No   Warm-up and Cool-down Performed as group-led instruction   Resistance Training Performed Yes   VAD Patient? No     Pain Assessment   Currently in Pain? No/denies   Multiple Pain Sites No      Capillary Blood Glucose: No results found for this or any previous visit (from the past 24 hour(s)).     POCT Glucose - 08/26/16 1506      POCT Blood Glucose   Pre-Exercise 114 mg/dL   Post-Exercise 114 mg/dL         Exercise Prescription Changes - 08/26/16 1500      Response to Exercise   Blood Pressure (Admit) 102/72   Blood Pressure (Exercise) 128/70   Blood Pressure (Exit) 112/60   Heart Rate (Admit) 89 bpm   Heart Rate (Exercise) 96 bpm   Heart Rate (Exit) 98 bpm   Oxygen Saturation (Admit) 95 %   Oxygen Saturation (Exercise) 91 %   Oxygen Saturation (Exit) 93 %   Rating of Perceived Exertion (Exercise) 12   Perceived Dyspnea (Exercise) 2   Duration Progress to 45 minutes of aerobic exercise without signs/symptoms of physical distress   Intensity THRR unchanged     Progression   Progression Continue to progress workloads to maintain intensity without signs/symptoms of physical distress.     Resistance Training   Training Prescription Yes   Weight blue bands   Reps 10-12  10  minutes of strength training     Interval Training   Interval Training No     Bike   Level 5   Minutes 17     NuStep   Level 5   Minutes 17   METs 1.6     Track   Laps 15   Minutes 17     Goals Met:  Exercise tolerated well No report of cardiac concerns or symptoms Strength training completed today  Goals Unmet:  Not Applicable  Comments: Service time is from 1330 to 1500    Dr. Rush Farmer is Medical Director for Pulmonary Rehab at [facility].

## 2016-08-28 ENCOUNTER — Encounter (HOSPITAL_COMMUNITY)
Admission: RE | Admit: 2016-08-28 | Discharge: 2016-08-28 | Disposition: A | Discharge status: Home / Self Care | Payer: Non-veteran care | Source: Ambulatory Visit | Attending: Pulmonary Disease | Admitting: Pulmonary Disease

## 2016-08-28 VITALS — Wt 237.2 lb

## 2016-08-28 DIAGNOSIS — R0600 Dyspnea, unspecified: Secondary | ICD-10-CM | POA: Diagnosis not present

## 2016-08-28 DIAGNOSIS — J439 Emphysema, unspecified: Secondary | ICD-10-CM

## 2016-08-28 NOTE — Progress Notes (Signed)
Daily Session Note  Patient Details  Name: Samuel Watkins MRN: [id number] Date of Birth: [birth year] Referring Provider:   April Manson Pulmonary Rehab Walk Test from 07/24/2016 in [place]  Referring Provider  Dr. Gwenette Greet      Encounter Date: 08/28/2016  Check In:     Session Check In - 08/28/16 1330      Check-In   Location [facility]   Staff Present Rosebud Poles, RN, BSN;Molly diVincenzo, MS, ACSM RCEP, Exercise Physiologist;Lisa Ysidro Evert, RN;Portia Rollene Rotunda, RN, BSN   Supervising physician immediately available to respond to emergencies [facility] immediately available   Physician(s) Dr. Algis Liming   Medication changes reported     No   Fall or balance concerns reported    No   Warm-up and Cool-down Performed as group-led instruction   Resistance Training Performed Yes   VAD Patient? No     Pain Assessment   Currently in Pain? No/denies   Multiple Pain Sites No      Capillary Blood Glucose: No results found for this or any previous visit (from the past 24 hour(s)).      Exercise Prescription Changes - 08/28/16 1500      Response to Exercise   Blood Pressure (Admit) 98/52   Blood Pressure (Exercise) 140/70   Blood Pressure (Exit) 110/60   Heart Rate (Admit) 85 bpm   Heart Rate (Exercise) 100 bpm   Heart Rate (Exit) 98 bpm   Oxygen Saturation (Admit) 92 %   Oxygen Saturation (Exercise) 90 %   Oxygen Saturation (Exit) 94 %   Rating of Perceived Exertion (Exercise) 12   Perceived Dyspnea (Exercise) 1   Duration Progress to 45 minutes of aerobic exercise without signs/symptoms of physical distress   Intensity THRR unchanged     Progression   Progression Continue to progress workloads to maintain intensity without signs/symptoms of physical distress.     Resistance Training   Training Prescription Yes   Weight blue bands   Reps 10-12  10 minutes of strength training     Interval Training   Interval  Training No     Bike   Level 5   Minutes 17     Track   Laps 16   Minutes 17     Goals Met:  Exercise tolerated well Strength training completed today  Goals Unmet:  Not Applicable  Comments: Service time is from 1330 to 1500    Dr. Rush Farmer is Medical Director for Pulmonary Rehab at [facility].

## 2016-08-28 NOTE — Progress Notes (Signed)
Pulmonary Individual Treatment Plan  Patient Details  Name: Samuel Watkins MRN: [id number] Date of Birth: 1946-02-23 Referring Provider:   April Manson Pulmonary Rehab Walk Test from 07/24/2016 in [place]  Referring Provider  Dr. Gwenette Greet      Initial Encounter Date:  Flowsheet Row Pulmonary Rehab Walk Test from 07/24/2016 in [place]  Date  07/24/16  Referring Provider  Dr. Gwenette Greet      Visit Diagnosis: Pulmonary emphysema, unspecified emphysema type ([place])  Patient's Home Medications on Admission:   Current Outpatient Prescriptions:  .  albuterol (PROVENTIL HFA;VENTOLIN HFA) 108 (90 BASE) MCG/ACT inhaler, Inhale 2 puffs into the lungs 4 (four) times daily as needed for wheezing or shortness of breath., Disp: , Rfl:  .  aspirin 325 MG tablet, Take 325 mg by mouth daily., Disp: , Rfl:  .  atorvastatin (LIPITOR) 80 MG tablet, Take 80 mg by mouth daily.  , Disp: , Rfl:  .  budesonide-formoterol (SYMBICORT) 160-4.5 MCG/ACT inhaler, Inhale 2 puffs into the lungs 2 (two) times daily., Disp: , Rfl:  .  losartan (COZAAR) 50 MG tablet, Take 50 mg by mouth daily.  , Disp: , Rfl:  .  metFORMIN (GLUCOPHAGE) 500 MG tablet, Take 500 mg by mouth 2 (two) times daily with a meal. , Disp: , Rfl:  .  niacin (NIASPAN) 1000 MG CR tablet, Take 1,000 mg by mouth at bedtime.  , Disp: , Rfl:  .  nitroGLYCERIN (NITROSTAT) 0.4 MG SL tablet, Place 0.4 mg under the tongue every 5 (five) minutes as needed.  , Disp: , Rfl:  .  pantoprazole (PROTONIX) 40 MG tablet, Take 40 mg by mouth daily., Disp: , Rfl:  .  Tiotropium Bromide-Olodaterol (STIOLTO RESPIMAT) 2.5-2.5 MCG/ACT AERS, Inhale 2 puffs into the lungs daily., Disp: , Rfl:   Past Medical History: Past Medical History:  Diagnosis Date  . CAD (coronary artery disease)   . Chronic hepatitis B ([place])   . Hyperlipemia   . Hypertension     Tobacco Use: History  Smoking Status  . Former  Smoker  . Packs/day: 1.00  . Years: 50.00  . Types: Cigarettes  . Quit date: 07/14/2010  Smokeless Tobacco  . Never Used    Comment: Counseled to remain smoke free.    Labs: Recent Review Flowsheet Data    Labs for ITP Cardiac and Pulmonary Rehab 07/09/2007 07/10/2007 07/17/2007   Cholestrol - 189 ATP III CLASSIFICATION: <200     mg/dL   Desirable 200-239  mg/dL   Borderline High >=240    mg/dL   High -   LDLCALC - 150 Total Cholesterol/HDL:CHD Risk Coronary Heart Disease Risk Table Men   Women 1/2 Average Risk   3.4   3.3(H) -   HDL - 18(L) -   Trlycerides - 105 -   HCO3 22.7 - 27.8(H)   TCO2 24 - 29      Capillary Blood Glucose: No results found for: GLUCAP     POCT Glucose    Row Name 08/07/16 1610 08/14/16 1558 08/19/16 1519 08/26/16 1506       POCT Blood Glucose   Pre-Exercise 163 mg/dL 147 mg/dL 148 mg/dL 114 mg/dL    Post-Exercise 170 mg/dL 137 mg/dL 142 mg/dL 114 mg/dL       ADL UCSD:     Pulmonary Assessment Scores    Row Name 07/24/16 1625         ADL UCSD  ADL Phase Entry     SOB Score total 18        Pulmonary Function Assessment:     Pulmonary Function Assessment - 07/21/16 1448      Breath   Bilateral Breath Sounds Clear   Shortness of Breath Limiting activity      Exercise Target Goals:    Exercise Program Goal: Individual exercise prescription set with THRR, safety & activity barriers. Participant demonstrates ability to understand and report RPE using BORG scale, to self-measure pulse accurately, and to acknowledge the importance of the exercise prescription.  Exercise Prescription Goal: Starting with aerobic activity 30 plus minutes a day, 3 days per week for initial exercise prescription. Provide home exercise prescription and guidelines that participant acknowledges understanding prior to discharge.  Activity Barriers & Risk Stratification:     Activity Barriers & Cardiac Risk Stratification - 07/21/16 1448       Activity Barriers & Cardiac Risk Stratification   Activity Barriers Shortness of Breath;Back Problems      6 Minute Walk:     6 Minute Walk    Row Name 07/24/16 1621         6 Minute Walk   Phase Initial     Distance 1300 feet     Walk Time 6 minutes     # of Rest Breaks 0     MPH 2.46     METS 2.84     RPE 12     Perceived Dyspnea  1     Symptoms Yes (comment)     Comments hip discomfort and calf tightness     Resting HR 90 bpm     Resting BP 120/80     Max Ex. HR 105 bpm     Max Ex. BP 158/80     2 Minute Post BP 130/80       Interval HR   Baseline HR 90     1 Minute HR 99     2 Minute HR 97     3 Minute HR 102     4 Minute HR 99     5 Minute HR 102     6 Minute HR 105     2 Minute Post HR 95     Interval Heart Rate? Yes       Interval Oxygen   Interval Oxygen? Yes     Baseline Oxygen Saturation % 94 %     Baseline Liters of Oxygen 0 L     1 Minute Oxygen Saturation % 92 %     1 Minute Liters of Oxygen 0 L     2 Minute Oxygen Saturation % 90 %     2 Minute Liters of Oxygen 0 L     3 Minute Oxygen Saturation % 91 %     3 Minute Liters of Oxygen 0 L     4 Minute Oxygen Saturation % 91 %     4 Minute Liters of Oxygen 0 L     5 Minute Oxygen Saturation % 90 %     5 Minute Liters of Oxygen 0 L     6 Minute Oxygen Saturation % 90 %     6 Minute Liters of Oxygen 0 L     2 Minute Post Oxygen Saturation % 95 %     2 Minute Post Liters of Oxygen 0 L        Initial Exercise Prescription:     Initial Exercise Prescription -  07/24/16 1600      Date of Initial Exercise RX and Referring Provider   Date 07/24/16   Referring Provider Dr. Gwenette Greet     Bike   Level 2  sci fit   Minutes 17     NuStep   Level 2   Minutes 17   METs 1.5     Track   Laps 10   Minutes 17     Prescription Details   Frequency (times per week) 2   Duration Progress to 45 minutes of aerobic exercise without signs/symptoms of physical distress     Intensity   THRR 40-80% of  Max Heartrate 61-122   Ratings of Perceived Exertion 11-13   Perceived Dyspnea 0-4     Progression   Progression Continue progressive overload as per policy without signs/symptoms or physical distress.     Resistance Training   Training Prescription Yes   Weight blue bands      Perform Capillary Blood Glucose checks as needed.  Exercise Prescription Changes:     Exercise Prescription Changes    Row Name 08/05/16 1600 08/07/16 1607 08/14/16 1500 08/19/16 1500 08/21/16 1500     Exercise Review   Progression Yes Yes Yes Yes  -     Response to Exercise   Blood Pressure (Admit) 120/66 126/72 122/64 112/60 140/70   Blood Pressure (Exercise) 144/70 120/80 130/70 152/76 150/80   Blood Pressure (Exit) 130/74 120/68 140/80 114/60 108/78   Heart Rate (Admit) 96 bpm 99 bpm 89 bpm 94 bpm 83 bpm   Heart Rate (Exercise) 109 bpm 105 bpm 103 bpm 133 bpm 93 bpm   Heart Rate (Exit) 104 bpm 97 bpm 95 bpm 99 bpm 96 bpm   Oxygen Saturation (Admit) 91 % 89 % 91 % 93 % 94 %   Oxygen Saturation (Exercise) 90 % 92 % 88 % 90 % 93 %   Oxygen Saturation (Exit) 90 % 94 % 90 % 92 % 93 %   Rating of Perceived Exertion (Exercise) 13 11 13 13 11    Perceived Dyspnea (Exercise) 1 1 1 1  0   Duration Progress to 45 minutes of aerobic exercise without signs/symptoms of physical distress Progress to 45 minutes of aerobic exercise without signs/symptoms of physical distress Progress to 45 minutes of aerobic exercise without signs/symptoms of physical distress Progress to 45 minutes of aerobic exercise without signs/symptoms of physical distress Progress to 45 minutes of aerobic exercise without signs/symptoms of physical distress   Intensity -  40-80% HRR -  40-80% HRR THRR unchanged THRR unchanged THRR unchanged     Progression   Progression  -  - Continue to progress workloads to maintain intensity without signs/symptoms of physical distress. Continue to progress workloads to maintain intensity without  signs/symptoms of physical distress. Continue to progress workloads to maintain intensity without signs/symptoms of physical distress.     Resistance Training   Training Prescription Yes Yes Yes Yes Yes   Weight blue bands blue bands blue bands blue bands blue bands   Reps 10-12  10 minutes of strength training 10-12  10 minutes of strength training 10-12  10 minutes of strength training 10-12  10 minutes of strength training 10-12  10 minutes of strength training     Interval Training   Interval Training No No No No No     Bike   Level 2 4  - 5 5   Minutes 17 17  - 17 17     NuStep  Level 3 4 4 5 5    Minutes 17 17 17 17 17    METs  - 1.6 2.1 2.4 1.7     Track   Laps 14  - 16 10  -   Minutes 17  - 17 17  -   Row Name 08/26/16 1500             Response to Exercise   Blood Pressure (Admit) 102/72       Blood Pressure (Exercise) 128/70       Blood Pressure (Exit) 112/60       Heart Rate (Admit) 89 bpm       Heart Rate (Exercise) 96 bpm       Heart Rate (Exit) 98 bpm       Oxygen Saturation (Admit) 95 %       Oxygen Saturation (Exercise) 91 %       Oxygen Saturation (Exit) 93 %       Rating of Perceived Exertion (Exercise) 12       Perceived Dyspnea (Exercise) 2       Duration Progress to 45 minutes of aerobic exercise without signs/symptoms of physical distress       Intensity THRR unchanged         Progression   Progression Continue to progress workloads to maintain intensity without signs/symptoms of physical distress.         Resistance Training   Training Prescription Yes       Weight blue bands       Reps 10-12  10 minutes of strength training         Interval Training   Interval Training No         Bike   Level 5       Minutes 17         NuStep   Level 5       Minutes 17       METs 1.6         Track   Laps 15       Minutes 17          Exercise Comments:     Exercise Comments    Row Name 08/05/16 2878 08/25/16 1626         Exercise  Comments Patient's first day of rehab is today Patient has attended 5 sessions. Progressing well. Very open to working hard and increasing intensities. Sometimes needs to be reminded on working at the appropriate level.         Discharge Exercise Prescription (Final Exercise Prescription Changes):     Exercise Prescription Changes - 08/26/16 1500      Response to Exercise   Blood Pressure (Admit) 102/72   Blood Pressure (Exercise) 128/70   Blood Pressure (Exit) 112/60   Heart Rate (Admit) 89 bpm   Heart Rate (Exercise) 96 bpm   Heart Rate (Exit) 98 bpm   Oxygen Saturation (Admit) 95 %   Oxygen Saturation (Exercise) 91 %   Oxygen Saturation (Exit) 93 %   Rating of Perceived Exertion (Exercise) 12   Perceived Dyspnea (Exercise) 2   Duration Progress to 45 minutes of aerobic exercise without signs/symptoms of physical distress   Intensity THRR unchanged     Progression   Progression Continue to progress workloads to maintain intensity without signs/symptoms of physical distress.     Resistance Training   Training Prescription Yes   Weight blue bands   Reps 10-12  10 minutes of  strength training     Interval Training   Interval Training No     Bike   Level 5   Minutes 17     NuStep   Level 5   Minutes 17   METs 1.6     Track   Laps 15   Minutes 17       Nutrition:  Target Goals: Understanding of nutrition guidelines, daily intake of sodium <1568m, cholesterol <2026m calories 30% from fat and 7% or less from saturated fats, daily to have 5 or more servings of fruits and vegetables.  Biometrics:     Pre Biometrics - 07/24/16 1621      Pre Biometrics   Grip Strength 44 kg       Nutrition Therapy Plan and Nutrition Goals:     Nutrition Therapy & Goals - 08/12/16 1022      Nutrition Therapy   Diet Carb Modified, Therapeutic Lifestyle Changes     Personal Nutrition Goals   Personal Goal #1 1-2 lb wt loss/week to a wt loss goal of 6-24 lb at  graduation from [facility] and counsel regarding individualized specific dietary modifications aiming towards targeted core components such as weight, hypertension, lipid management, diabetes, heart failure and other comorbidities.   Expected Outcomes Short Term Goal: Understand basic principles of dietary content, such as calories, fat, sodium, cholesterol and nutrients.;Long Term Goal: Adherence to prescribed nutrition plan.      Nutrition Discharge: Rate Your Plate Scores:     Nutrition Assessments - 08/12/16 1014      Rate Your Plate Scores   Pre Score 47      Psychosocial: Target Goals: Acknowledge presence or absence of depression, maximize coping skills, provide positive support system. Participant is able to verbalize types and ability to use techniques and skills needed for reducing stress and depression.  Initial Review & Psychosocial Screening:     Initial Psych Review & Screening - 07/21/16 [id number]     Barriers   Psychosocial barriers to participate in program There are no identifiable barriers or psychosocial needs.     Screening Interventions   Interventions Encouraged to exercise      Quality of Life Scores:     Quality of Life - 07/24/16 1626      Quality of Life Scores   Health/Function Pre 27.6 %   Socioeconomic Pre 30 %   Psych/Spiritual Pre 30 %   Family Pre 25.5 %   GLOBAL Pre 28.46 %      PHQ-9: Recent Review Flowsheet Data    [facility]   Decreased Interest 0   Down, Depressed, Hopeless 0   PHQ - 2 Score 0      Psychosocial Evaluation and Intervention:     Psychosocial Evaluation - 07/21/16 1503      Psychosocial Evaluation & Interventions   Interventions Encouraged to exercise with the program and follow exercise prescription      Psychosocial Re-Evaluation:     Psychosocial Re-Evaluation     [place] 08/05/16 1631 08/26/16 0911           Psychosocial Re-Evaluation   Interventions Encouraged to attend Pulmonary Rehabilitation for the exercise Encouraged to attend Pulmonary Rehabilitation for the exercise      Comments no psychosocial issues identified no psychosocial issues identified      Continued  Psychosocial Services Needed No No        Education: Education Goals: Education classes will be provided on a weekly basis, covering required topics. Participant will state understanding/return demonstration of topics presented.  Learning Barriers/Preferences:   Education Topics: Risk Factor Reduction:  -Group instruction that is supported by a PowerPoint presentation. Instructor discusses the definition of a risk factor, different risk factors for pulmonary disease, and how the heart and lungs work together.     Nutrition for Pulmonary Patient:  -Group instruction provided by PowerPoint slides, verbal discussion, and written materials to support subject matter. The instructor gives an explanation and review of healthy diet recommendations, which includes a discussion on weight management, recommendations for fruit and vegetable consumption, as well as protein, fluid, caffeine, fiber, sodium, sugar, and alcohol. Tips for eating when patients are short of breath are discussed.   Pursed Lip Breathing:  -Group instruction that is supported by demonstration and informational handouts. Instructor discusses the benefits of pursed lip and diaphragmatic breathing and detailed demonstration on how to preform both.     Oxygen Safety:  -Group instruction provided by PowerPoint, verbal discussion, and written material to support subject matter. There is an overview of "What is Oxygen" and "Why do we need it".  Instructor also reviews how to create a safe environment for oxygen use, the importance of using oxygen as prescribed, and the risks of noncompliance. There is a brief discussion on  traveling with oxygen and resources the patient may utilize.   Oxygen Equipment:  -Group instruction provided by [facility] Staff utilizing handouts, written materials, and equipment demonstrations.   Signs and Symptoms:  -Group instruction provided by written material and verbal discussion to support subject matter. Warning signs and symptoms of infection, stroke, and heart attack are reviewed and when to call the physician/911 reinforced. Tips for preventing the spread of infection discussed. Flowsheet Row PULMONARY REHAB OTHER RESPIRATORY from 08/14/2016 in [place]  Date  08/14/16  Educator  rn  Instruction Review Code  2- meets goals/outcomes      Advanced Directives:  -Group instruction provided by verbal instruction and written material to support subject matter. Instructor reviews Advanced Directive laws and proper instruction for filling out document.   Pulmonary Video:  -Group video education that reviews the importance of medication and oxygen compliance, exercise, good nutrition, pulmonary hygiene, and pursed lip and diaphragmatic breathing for the pulmonary patient.   Exercise for the Pulmonary Patient:  -Group instruction that is supported by a PowerPoint presentation. Instructor discusses benefits of exercise, core components of exercise, frequency, duration, and intensity of an exercise routine, importance of utilizing pulse oximetry during exercise, safety while exercising, and options of places to exercise outside of rehab.     Pulmonary Medications:  -Verbally interactive group education provided by instructor with focus on inhaled medications and proper administration. Flowsheet Row PULMONARY REHAB OTHER RESPIRATORY from 08/14/2016 in [place]  Date  08/07/16  Educator  Pharm  Instruction Review Code  2- meets goals/outcomes      Anatomy and Physiology of the Respiratory System and Intimacy:   -Group instruction provided by PowerPoint, verbal discussion, and written material to support subject matter. Instructor reviews respiratory cycle and anatomical components of the respiratory system and their functions. Instructor also reviews differences in obstructive and restrictive respiratory diseases with examples of each. Intimacy, Sex, and Sexuality differences are reviewed with a discussion on how relationships can change when diagnosed with  pulmonary disease. Common sexual concerns are reviewed.   Knowledge Questionnaire Score:     Knowledge Questionnaire Score - 07/24/16 1625      Knowledge Questionnaire Score   Pre Score 9/13      Core Components/Risk Factors/Patient Goals at Admission:     Personal Goals and Risk Factors at Admission - 07/21/16 1457      Core Components/Risk Factors/Patient Goals on Admission    Weight Management Yes;Obesity   Intervention Obesity: Provide education and appropriate resources to help participant work on and attain dietary goals.;Weight Management/Obesity: Establish reasonable short term and long term weight goals.   Improve shortness of breath with ADL's Yes   Intervention Provide education, individualized exercise plan and daily activity instruction to help decrease symptoms of SOB with activities of daily living.   Expected Outcomes Short Term: Achieves a reduction of symptoms when performing activities of daily living.   Develop more efficient breathing techniques such as purse lipped breathing and diaphragmatic breathing; and practicing self-pacing with activity Yes   Intervention Provide education, demonstration and support about specific breathing techniuqes utilized for more efficient breathing. Include techniques such as pursed lipped breathing, diaphragmatic breathing and self-pacing activity.   Expected Outcomes Short Term: Participant will be able to demonstrate and use breathing techniques as needed throughout daily activities.    Increase knowledge of respiratory medications and ability to use respiratory devices properly  Yes   Intervention Provide education and demonstration as needed of appropriate use of medications, inhalers, and oxygen therapy.   Expected Outcomes Short Term: Achieves understanding of medications use. Understands that oxygen is a medication prescribed by physician. Demonstrates appropriate use of inhaler and oxygen therapy.   Diabetes Yes   Intervention Provide education about signs/symptoms and action to take for hypo/hyperglycemia.;Provide education about proper nutrition, including hydration, and aerobic/resistive exercise prescription along with prescribed medications to achieve blood glucose in normal ranges: Fasting glucose 65-99 mg/dL   Expected Outcomes Short Term: Participant verbalizes understanding of the signs/symptoms and immediate care of hyper/hypoglycemia, proper foot care and importance of medication, aerobic/resistive exercise and nutrition plan for blood glucose control.;Long Term: Attainment of HbA1C < 7%.      Core Components/Risk Factors/Patient Goals Review:      Goals and Risk Factor Review    Row Name 08/05/16 1630 08/26/16 0909           Core Components/Risk Factors/Patient Goals Review   Personal Goals Review Weight Management/Obesity;Increase Strength and Stamina;Improve shortness of breath with ADL's;Diabetes;Develop more efficient breathing techniques such as purse lipped breathing and diaphragmatic breathing and practicing self-pacing with activity.;Increase knowledge of respiratory medications and ability to use respiratory devices properly. Weight Management/Obesity;Increase Strength and Stamina;Diabetes;Develop more efficient breathing techniques such as purse lipped breathing and diaphragmatic breathing and practicing self-pacing with activity.;Increase knowledge of respiratory medications and ability to use respiratory devices properly.      Review First day of  exercise, too early to see progress progressing well, involved, asking questions, seems interested in what our program has to offer, no weight loss      Expected Outcomes should see progress in next 30 days continue to increase strength and knowledge base         Core Components/Risk Factors/Patient Goals at Discharge (Final Review):      Goals and Risk Factor Review - 08/26/16 0909      Core Components/Risk Factors/Patient Goals Review   Personal Goals Review Weight Management/Obesity;Increase Strength and Stamina;Diabetes;Develop more efficient breathing techniques such as purse lipped  breathing and diaphragmatic breathing and practicing self-pacing with activity.;Increase knowledge of respiratory medications and ability to use respiratory devices properly.   Review progressing well, involved, asking questions, seems interested in what our program has to offer, no weight loss   Expected Outcomes continue to increase strength and knowledge base      ITP Comments:   Comments: ITP REVIEW Pt is making expected progress toward pulmonary rehab goals after completing 6 sessions. Recommend continued exercise, life style modification, education, and utilization of breathing techniques to increase stamina and strength and decrease shortness of breath with exertion.

## 2016-08-29 DIAGNOSIS — M5410 Radiculopathy, site unspecified: Secondary | ICD-10-CM | POA: Diagnosis not present

## 2016-09-02 ENCOUNTER — Encounter (HOSPITAL_COMMUNITY)
Admission: RE | Admit: 2016-09-02 | Discharge: 2016-09-02 | Disposition: A | Discharge status: Home / Self Care | Payer: Non-veteran care | Source: Ambulatory Visit | Attending: Pulmonary Disease | Admitting: Pulmonary Disease

## 2016-09-02 VITALS — Wt 236.6 lb

## 2016-09-02 DIAGNOSIS — J439 Emphysema, unspecified: Secondary | ICD-10-CM

## 2016-09-02 DIAGNOSIS — R0600 Dyspnea, unspecified: Secondary | ICD-10-CM | POA: Diagnosis not present

## 2016-09-02 NOTE — Progress Notes (Signed)
I have reviewed a Home Exercise Prescription with Samuel Watkins . Samuel Watkins is not currently exercising at home.  The patient was advised to walk 2-3 days a week for 30-45 minutes.  Samuel Watkins and I discussed how to progress their exercise prescription.  The patient stated that their goals were to lose weight so he can feel better.  The patient stated that they understand the exercise prescription.  We reviewed exercise guidelines, target heart rate during exercise, oxygen use, weather, home pulse oximeter, endpoints for exercise, and goals.  Patient is encouraged to come to me with any questions. I will continue to follow up with the patient to assist them with progression and safety.

## 2016-09-02 NOTE — Progress Notes (Addendum)
Daily Session Note  Patient Details  Name: [name] MRN: [id number] Date of Birth: 1945/12/20 Referring Provider:   April Manson Pulmonary Rehab Walk Test from 07/24/2016 in [place]  Referring Provider  Dr. Gwenette Greet      Encounter Date: 09/02/2016  Check In:     Session Check In - 09/02/16 1330      Check-In   Location [facility]   Staff Present Su Hilt, MS, ACSM RCEP, Exercise Physiologist;Joan Leonia Reeves, RN, BSN;Kawehi Hostetter Ysidro Evert, RN;Portia Rollene Rotunda, RN, BSN   Supervising physician immediately available to respond to emergencies [facility] immediately available   Physician(s) Dr. Candiss Norse   Medication changes reported     No   Fall or balance concerns reported    No   Warm-up and Cool-down Performed as group-led instruction   Resistance Training Performed Yes   VAD Patient? No     Pain Assessment   Currently in Pain? No/denies   Multiple Pain Sites No      Capillary Blood Glucose: No results found for this or any previous visit (from the past 24 hour(s)).      Exercise Prescription Changes - 09/02/16 1500      Exercise Review   Progression Yes     Response to Exercise   Blood Pressure (Admit) 140/90   Blood Pressure (Exercise) 142/80   Blood Pressure (Exit) 134/74   Heart Rate (Admit) 62 bpm   Heart Rate (Exercise) 87 bpm   Heart Rate (Exit) 55 bpm   Oxygen Saturation (Admit) 95 %   Oxygen Saturation (Exercise) 92 %   Oxygen Saturation (Exit) 94 %   Rating of Perceived Exertion (Exercise) 12   Perceived Dyspnea (Exercise) 1   Duration Progress to 45 minutes of aerobic exercise without signs/symptoms of physical distress   Intensity THRR unchanged     Progression   Progression Continue to progress workloads to maintain intensity without signs/symptoms of physical distress.     Resistance Training   Training Prescription Yes   Weight blue bands   Reps 10-12  10 minutes of strength training      Interval Training   Interval Training No     Bike   Level 5   Minutes 17     NuStep   Level 6   Minutes 17   METs 1.8     Track   Laps 16   Minutes 17     Goals Met:  Exercise tolerated well No report of cardiac concerns or symptoms Strength training completed today  Goals Unmet:  Not Applicable  Comments: Service time is from 1330 to 1515     Dr. Rush Farmer is Medical Director for Pulmonary Rehab at [facility].

## 2016-09-04 ENCOUNTER — Encounter (HOSPITAL_COMMUNITY)
Admission: RE | Admit: 2016-09-04 | Discharge: 2016-09-04 | Disposition: A | Discharge status: Home / Self Care | Payer: Non-veteran care | Source: Ambulatory Visit | Attending: Pulmonary Disease | Admitting: Pulmonary Disease

## 2016-09-04 DIAGNOSIS — R0600 Dyspnea, unspecified: Secondary | ICD-10-CM | POA: Diagnosis not present

## 2016-09-04 DIAGNOSIS — J439 Emphysema, unspecified: Secondary | ICD-10-CM

## 2016-09-04 NOTE — Progress Notes (Signed)
Daily Session Note  Patient Details  Name: Samuel Watkins MRN: [id number] Date of Birth: [birth year] Referring Provider:   April Manson Pulmonary Rehab Walk Test from 07/24/2016 in [place]  Referring Provider  Dr. Gwenette Greet      Encounter Date: 09/04/2016  Check In:     Session Check In - 09/04/16 1334      Check-In   Location [facility]   Staff Present Su Hilt, MS, ACSM RCEP, Exercise Physiologist;Zephyr Ridley Ysidro Evert, RN;Portia Rollene Rotunda, RN, BSN   Supervising physician immediately available to respond to emergencies [facility] immediately available   Physician(s) Dr. Tana Coast   Medication changes reported     No   Fall or balance concerns reported    No   Warm-up and Cool-down Performed as group-led instruction   Resistance Training Performed Yes   VAD Patient? No     Pain Assessment   Currently in Pain? No/denies   Multiple Pain Sites No      Capillary Blood Glucose: No results found for this or any previous visit (from the past 24 hour(s)).     POCT Glucose - 09/04/16 1510      POCT Blood Glucose   Pre-Exercise 194 mg/dL   Post-Exercise 198 mg/dL         Exercise Prescription Changes - 09/04/16 1500      Exercise Review   Progression Yes     Response to Exercise   Blood Pressure (Admit) 124/68   Blood Pressure (Exercise) 140/80   Blood Pressure (Exit) 150/64   Heart Rate (Admit) 66 bpm   Heart Rate (Exercise) 97 bpm   Heart Rate (Exit) 93 bpm   Oxygen Saturation (Admit) 94 %   Oxygen Saturation (Exercise) 93 %   Oxygen Saturation (Exit) 90 %   Rating of Perceived Exertion (Exercise) 12   Perceived Dyspnea (Exercise) 2   Duration Progress to 45 minutes of aerobic exercise without signs/symptoms of physical distress   Intensity THRR unchanged     Progression   Progression Continue to progress workloads to maintain intensity without signs/symptoms of physical distress.     Resistance Training   Training Prescription Yes   Weight blue bands   Reps 10-12  10 minutes of strength training     Interval Training   Interval Training No     Bike   Level 6   Minutes 15     NuStep   Level 6   Minutes 17   METs 2.4     Goals Met:  Exercise tolerated well No report of cardiac concerns or symptoms Strength training completed today  Goals Unmet:  Not Applicable  Comments: Service time is from 1330 to 1450    Dr. Rush Farmer is Medical Director for Pulmonary Rehab at [facility].

## 2016-09-09 ENCOUNTER — Encounter (HOSPITAL_COMMUNITY)
Admission: RE | Admit: 2016-09-09 | Discharge: 2016-09-09 | Disposition: A | Discharge status: Home / Self Care | Payer: Non-veteran care | Source: Ambulatory Visit | Attending: Pulmonary Disease | Admitting: Pulmonary Disease

## 2016-09-09 VITALS — Wt 234.6 lb

## 2016-09-09 DIAGNOSIS — R0600 Dyspnea, unspecified: Secondary | ICD-10-CM | POA: Diagnosis not present

## 2016-09-09 DIAGNOSIS — J439 Emphysema, unspecified: Secondary | ICD-10-CM

## 2016-09-09 NOTE — Progress Notes (Signed)
Daily Session Note  Patient Details  Name: Samuel Watkins MRN: [id number] Date of Birth: 1945-11-25 Referring Provider:   April Manson Pulmonary Rehab Walk Test from 07/24/2016 in [place]  Referring Provider  Dr. Gwenette Greet      Encounter Date: 09/09/2016  Check In:     Session Check In - 09/09/16 1330      Check-In   [facility]   Staff Present Su Hilt, MS, ACSM RCEP, Exercise Physiologist;Lisa Ysidro Evert, RN;Othon Guardia Rollene Rotunda, RN, Maxcine Ham, RN, BSN;Other  cardiopulmonary rehab interns   Supervising physician immediately available to respond to emergencies [facility] immediately available   Physician(s) Dr. Posey Pronto   Medication changes reported     No   Fall or balance concerns reported    No   Tobacco Cessation No Change   Warm-up and Cool-down Performed as group-led instruction   Resistance Training Performed Yes   VAD Patient? No     Pain Assessment   Currently in Pain? No/denies   Multiple Pain Sites No      Capillary Blood Glucose: No results found for this or any previous visit (from the past 24 hour(s)).      Exercise Prescription Changes - 09/09/16 1504      Response to Exercise   Blood Pressure (Admit) 110/64   Blood Pressure (Exercise) 142/76   Blood Pressure (Exit) 128/74   Heart Rate (Admit) 84 bpm   Heart Rate (Exercise) 99 bpm   Heart Rate (Exit) 98 bpm   Oxygen Saturation (Admit) 93 %   Oxygen Saturation (Exercise) 90 %   Oxygen Saturation (Exit) 93 %   Rating of Perceived Exertion (Exercise) 12   Perceived Dyspnea (Exercise) 2   Duration Progress to 45 minutes of aerobic exercise without signs/symptoms of physical distress   Intensity THRR unchanged     Progression   Progression Continue to progress workloads to maintain intensity without signs/symptoms of physical distress.     Resistance Training   Training Prescription Yes   Weight blue bands   Reps 10-15   Time 10  Minutes     Interval Training   Interval Training No     Bike   Level 6   Minutes 17     NuStep   Level 6   Minutes 17   METs 2.1     Track   Laps 16   Minutes 17     Exercise Review   Progression --      History  Smoking Status  . Former Smoker  . Packs/day: 1.00  . Years: 50.00  . Types: Cigarettes  . Quit date: 07/14/2010  Smokeless Tobacco  . Never Used    Comment: Counseled to remain smoke free.    Goals Met:  Improved SOB with ADL's Exercise tolerated well No report of cardiac concerns or symptoms Strength training completed today  Goals Unmet:  Not Applicable  Comments: Service time is from 1330 to 1440   Dr. Rush Farmer is Medical Director for Pulmonary Rehab at [facility].

## 2016-09-11 ENCOUNTER — Encounter (HOSPITAL_COMMUNITY)
Admission: RE | Admit: 2016-09-11 | Discharge: 2016-09-11 | Disposition: A | Discharge status: Home / Self Care | Payer: Non-veteran care | Source: Ambulatory Visit | Attending: Pulmonary Disease | Admitting: Pulmonary Disease

## 2016-09-11 VITALS — Wt 232.8 lb

## 2016-09-11 DIAGNOSIS — J439 Emphysema, unspecified: Secondary | ICD-10-CM

## 2016-09-11 DIAGNOSIS — R0600 Dyspnea, unspecified: Secondary | ICD-10-CM | POA: Insufficient documentation

## 2016-09-11 NOTE — Progress Notes (Signed)
Daily Session Note  Patient Details  Name: Samuel Watkins MRN: [id number] Date of Birth: 11-25-1945 Referring Provider:   April Manson Pulmonary Rehab Walk Test from 07/24/2016 in [place]  Referring Provider  Dr. Gwenette Greet      Encounter Date: 09/11/2016  Check In:     Session Check In - 09/11/16 1338      Check-In   Location [facility]   Staff Present Su Hilt, MS, ACSM RCEP, Exercise Physiologist;Zoie Sarin Rollene Rotunda, RN, Maxcine Ham, RN, Roque Cash, RN   Supervising physician immediately available to respond to emergencies [facility] immediately available   Physician(s) Dr. Laurena Bering   Medication changes reported     No   Fall or balance concerns reported    No   Tobacco Cessation No Change   Warm-up and Cool-down Performed as group-led instruction   Resistance Training Performed Yes   VAD Patient? No     Pain Assessment   Currently in Pain? No/denies   Multiple Pain Sites No      Capillary Blood Glucose: No results found for this or any previous visit (from the past 24 hour(s)).      Exercise Prescription Changes - 09/11/16 1619      Response to Exercise   Blood Pressure (Admit) 106/80   Blood Pressure (Exercise) 126/80   Blood Pressure (Exit) 118/78   Heart Rate (Admit) 99 bpm   Heart Rate (Exercise) 103 bpm   Heart Rate (Exit) 99 bpm   Oxygen Saturation (Admit) 93 %   Oxygen Saturation (Exercise) 90 %   Oxygen Saturation (Exit) 93 %   Rating of Perceived Exertion (Exercise) 12   Perceived Dyspnea (Exercise) 2   Duration Progress to 45 minutes of aerobic exercise without signs/symptoms of physical distress   Intensity THRR unchanged     Progression   Progression Continue to progress workloads to maintain intensity without signs/symptoms of physical distress.     Resistance Training   Training Prescription Yes   Weight blue bands   Reps 10-15   Time 10 Minutes     Interval Training   Interval Training No     NuStep   Level 6   Minutes 17   METs 1.9     Track   Laps 14   Minutes 17      History  Smoking Status  . Former Smoker  . Packs/day: 1.00  . Years: 50.00  . Types: Cigarettes  . Quit date: 07/14/2010  Smokeless Tobacco  . Never Used    Comment: Counseled to remain smoke free.    Goals Met:  Improved SOB with ADL's Using PLB without cueing & demonstrates good technique Exercise tolerated well No report of cardiac concerns or symptoms Strength training completed today  Goals Unmet:  Not Applicable  Comments: Service time is from 1330 to 1520   Dr. Rush Farmer is Medical Director for Pulmonary Rehab at [facility].

## 2016-09-16 ENCOUNTER — Encounter (HOSPITAL_COMMUNITY)
Admission: RE | Admit: 2016-09-16 | Discharge: 2016-09-16 | Disposition: A | Discharge status: Home / Self Care | Payer: Non-veteran care | Source: Ambulatory Visit | Attending: Pulmonary Disease | Admitting: Pulmonary Disease

## 2016-09-16 VITALS — Wt 233.7 lb

## 2016-09-16 DIAGNOSIS — J439 Emphysema, unspecified: Secondary | ICD-10-CM

## 2016-09-16 DIAGNOSIS — R0600 Dyspnea, unspecified: Secondary | ICD-10-CM | POA: Diagnosis not present

## 2016-09-16 NOTE — Progress Notes (Signed)
Daily Session Note  Patient Details  Name: Samuel Watkins MRN: [id number] Date of Birth: 03-27-1946 Referring Provider:   April Manson Pulmonary Rehab Walk Test from 07/24/2016 in [place]  Referring Provider  Dr. Gwenette Greet      Encounter Date: 09/16/2016  Check In:     Session Check In - 09/16/16 1330      Check-In   Location [facility]   Staff Present Su Hilt, MS, ACSM RCEP, Exercise Physiologist;Joan Leonia Reeves, RN, BSN;Lisa Ysidro Evert, RN;Laurabelle Gorczyca Rollene Rotunda, RN, BSN;Other  cardiopulmonary rehab intern   Supervising physician immediately available to respond to emergencies [facility] immediately available   Physician(s) Dr. Candiss Norse   Medication changes reported     No   Fall or balance concerns reported    No   Tobacco Cessation No Change   Warm-up and Cool-down Performed as group-led instruction   Resistance Training Performed Yes   VAD Patient? No     Pain Assessment   Currently in Pain? No/denies   Multiple Pain Sites No      Capillary Blood Glucose: No results found for this or any previous visit (from the past 24 hour(s)).      Exercise Prescription Changes - 09/16/16 1534      Response to Exercise   Blood Pressure (Admit) 122/62   Blood Pressure (Exercise) 140/80   Blood Pressure (Exit) 116/68   Heart Rate (Admit) 91 bpm   Heart Rate (Exercise) 108 bpm   Heart Rate (Exit) 100 bpm   Oxygen Saturation (Admit) 93 %   Oxygen Saturation (Exercise) 92 %   Oxygen Saturation (Exit) 92 %   Rating of Perceived Exertion (Exercise) 12   Perceived Dyspnea (Exercise) 2   Duration Progress to 45 minutes of aerobic exercise without signs/symptoms of physical distress   Intensity THRR unchanged     Progression   Progression Continue to progress workloads to maintain intensity without signs/symptoms of physical distress.     Resistance Training   Training Prescription Yes   Weight blue bands   Reps 10-15   Time 10  Minutes     Interval Training   Interval Training No     Bike   Level 6   Minutes 17     NuStep   Level 6   Minutes 17   METs 2     Track   Laps 15   Minutes 17      History  Smoking Status  . Former Smoker  . Packs/day: 1.00  . Years: 50.00  . Types: Cigarettes  . Quit date: 07/14/2010  Smokeless Tobacco  . Never Used    Comment: Counseled to remain smoke free.    Goals Met:  Independence with exercise equipment Improved SOB with ADL's Exercise tolerated well No report of cardiac concerns or symptoms Strength training completed today  Goals Unmet:  Not Applicable  Comments: Service time is from 1330 to 1455   Dr. Rush Farmer is Medical Director for Pulmonary Rehab at [facility].

## 2016-09-18 ENCOUNTER — Encounter (HOSPITAL_COMMUNITY)
Admission: RE | Admit: 2016-09-18 | Discharge: 2016-09-18 | Disposition: A | Discharge status: Home / Self Care | Payer: Non-veteran care | Source: Ambulatory Visit | Attending: Pulmonary Disease | Admitting: Pulmonary Disease

## 2016-09-18 VITALS — Wt 236.1 lb

## 2016-09-18 DIAGNOSIS — J439 Emphysema, unspecified: Secondary | ICD-10-CM

## 2016-09-18 DIAGNOSIS — R0600 Dyspnea, unspecified: Secondary | ICD-10-CM | POA: Diagnosis not present

## 2016-09-18 NOTE — Progress Notes (Signed)
Daily Session Note  Patient Details  Name: Samuel Watkins MRN: [id number] Date of Birth: August 21, 1945 Referring Provider:   April Manson Pulmonary Rehab Walk Test from 07/24/2016 in [place]  Referring Provider  Dr. Gwenette Greet      Encounter Date: 09/18/2016  Check In:     Session Check In - 09/18/16 1402      Check-In   Location [facility]   Staff Present Rosebud Poles, RN, BSN;Molly diVincenzo, MS, ACSM RCEP, Exercise Physiologist;Keana Dueitt Rollene Rotunda, RN, BSN   Supervising physician immediately available to respond to emergencies [facility] immediately available   Physician(s) Dr. Tana Coast   Medication changes reported     No   Fall or balance concerns reported    No   Tobacco Cessation No Change   Warm-up and Cool-down Performed as group-led instruction   Resistance Training Performed Yes   VAD Patient? No     Pain Assessment   Currently in Pain? No/denies   Multiple Pain Sites No      Capillary Blood Glucose: No results found for this or any previous visit (from the past 24 hour(s)).      Exercise Prescription Changes - 09/18/16 1548      Response to Exercise   Blood Pressure (Admit) 110/64   Blood Pressure (Exercise) 158/88   Blood Pressure (Exit) 108/68   Heart Rate (Admit) 88 bpm   Heart Rate (Exercise) 113 bpm   Heart Rate (Exit) 84 bpm   Oxygen Saturation (Admit) 94 %   Oxygen Saturation (Exercise) 93 %   Oxygen Saturation (Exit) 92 %   Rating of Perceived Exertion (Exercise) 12   Perceived Dyspnea (Exercise) 2   Duration Progress to 45 minutes of aerobic exercise without signs/symptoms of physical distress   Intensity THRR unchanged     Progression   Progression Continue to progress workloads to maintain intensity without signs/symptoms of physical distress.     Resistance Training   Training Prescription Yes   Weight blue bands   Reps 10-15   Time 10 Minutes     Interval Training   Interval Training No      Bike   Level 7   Minutes 17     NuStep   Level 6   Minutes 17   METs 2.1     Exercise Review   Progression Yes      History  Smoking Status  . Former Smoker  . Packs/day: 1.00  . Years: 50.00  . Types: Cigarettes  . Quit date: 07/14/2010  Smokeless Tobacco  . Never Used    Comment: Counseled to remain smoke free.    Goals Met:  Independence with exercise equipment Improved SOB with ADL's Using PLB without cueing & demonstrates good technique Exercise tolerated well No report of cardiac concerns or symptoms Strength training completed today  Goals Unmet:  Not Applicable  Comments: Service time is from 1330 to 1540   Dr. Rush Farmer is Medical Director for Pulmonary Rehab at [facility].

## 2016-09-23 ENCOUNTER — Encounter (HOSPITAL_COMMUNITY)
Admission: RE | Admit: 2016-09-23 | Discharge: 2016-09-23 | Disposition: A | Discharge status: Home / Self Care | Payer: Non-veteran care | Source: Ambulatory Visit | Attending: Pulmonary Disease | Admitting: Pulmonary Disease

## 2016-09-23 VITALS — Wt 235.0 lb

## 2016-09-23 DIAGNOSIS — J439 Emphysema, unspecified: Secondary | ICD-10-CM

## 2016-09-23 DIAGNOSIS — R0600 Dyspnea, unspecified: Secondary | ICD-10-CM | POA: Diagnosis not present

## 2016-09-23 NOTE — Progress Notes (Signed)
Pulmonary Individual Treatment Plan  Patient Details  Name: Samuel Watkins MRN: [id number] Date of Birth: 11-02-1945 Referring Provider:   April Manson Pulmonary Rehab Walk Test from 07/24/2016 in [place]  Referring Provider  Dr. Gwenette Greet      Initial Encounter Date:  Flowsheet Row Pulmonary Rehab Walk Test from 07/24/2016 in [place]  Date  07/24/16  Referring Provider  Dr. Gwenette Greet      Visit Diagnosis: Pulmonary emphysema, unspecified emphysema type ([place])  Patient's Home Medications on Admission:   Current Outpatient Prescriptions:  .  albuterol (PROVENTIL HFA;VENTOLIN HFA) 108 (90 BASE) MCG/ACT inhaler, Inhale 2 puffs into the lungs 4 (four) times daily as needed for wheezing or shortness of breath., Disp: , Rfl:  .  aspirin 325 MG tablet, Take 325 mg by mouth daily., Disp: , Rfl:  .  atorvastatin (LIPITOR) 80 MG tablet, Take 80 mg by mouth daily.  , Disp: , Rfl:  .  budesonide-formoterol (SYMBICORT) 160-4.5 MCG/ACT inhaler, Inhale 2 puffs into the lungs 2 (two) times daily., Disp: , Rfl:  .  losartan (COZAAR) 50 MG tablet, Take 50 mg by mouth daily.  , Disp: , Rfl:  .  metFORMIN (GLUCOPHAGE) 500 MG tablet, Take 500 mg by mouth 2 (two) times daily with a meal. , Disp: , Rfl:  .  niacin (NIASPAN) 1000 MG CR tablet, Take 1,000 mg by mouth at bedtime.  , Disp: , Rfl:  .  nitroGLYCERIN (NITROSTAT) 0.4 MG SL tablet, Place 0.4 mg under the tongue every 5 (five) minutes as needed.  , Disp: , Rfl:  .  pantoprazole (PROTONIX) 40 MG tablet, Take 40 mg by mouth daily., Disp: , Rfl:  .  Tiotropium Bromide-Olodaterol (STIOLTO RESPIMAT) 2.5-2.5 MCG/ACT AERS, Inhale 2 puffs into the lungs daily., Disp: , Rfl:   Past Medical History: Past Medical History:  Diagnosis Date  . CAD (coronary artery disease)   . Chronic hepatitis B ([place])   . Hyperlipemia   . Hypertension     Tobacco Use: History  Smoking Status  . Former  Smoker  . Packs/day: 1.00  . Years: 50.00  . Types: Cigarettes  . Quit date: 07/14/2010  Smokeless Tobacco  . Never Used    Comment: Counseled to remain smoke free.    Labs: Recent Review Flowsheet Data    Labs for ITP Cardiac and Pulmonary Rehab 07/09/2007 07/10/2007 07/17/2007   Cholestrol - 189 ATP III CLASSIFICATION: <200     mg/dL   Desirable 200-239  mg/dL   Borderline High >=240    mg/dL   High -   LDLCALC - 150 Total Cholesterol/HDL:CHD Risk Coronary Heart Disease Risk Table Men   Women 1/2 Average Risk   3.4   3.3(H) -   HDL - 18(L) -   Trlycerides - 105 -   HCO3 22.7 - 27.8(H)   TCO2 24 - 29      Capillary Blood Glucose: No results found for: GLUCAP     POCT Glucose    Row Name 08/07/16 1610 08/14/16 1558 08/19/16 1519 08/26/16 1506 09/02/16 1545     POCT Blood Glucose   Pre-Exercise 163 mg/dL 147 mg/dL 148 mg/dL 114 mg/dL 190 mg/dL   Post-Exercise 170 mg/dL 137 mg/dL 142 mg/dL 114 mg/dL 155 mg/dL   Row Name 09/04/16 1510 09/16/16 1538           POCT Blood Glucose   Pre-Exercise 194 mg/dL 121 mg/dL  Post-Exercise 198 mg/dL 114 mg/dL         ADL UCSD:   Pulmonary Function Assessment:     Pulmonary Function Assessment - 07/21/16 1448      Breath   Bilateral Breath Sounds Clear   Shortness of Breath Limiting activity      Exercise Target Goals:    Exercise Program Goal: Individual exercise prescription set with THRR, safety & activity barriers. Participant demonstrates ability to understand and report RPE using BORG scale, to self-measure pulse accurately, and to acknowledge the importance of the exercise prescription.  Exercise Prescription Goal: Starting with aerobic activity 30 plus minutes a day, 3 days per week for initial exercise prescription. Provide home exercise prescription and guidelines that participant acknowledges understanding prior to discharge.  Activity Barriers & Risk Stratification:     Activity Barriers &  Cardiac Risk Stratification - 07/21/16 1448      Activity Barriers & Cardiac Risk Stratification   Activity Barriers Shortness of Breath;Back Problems      6 Minute Walk:     6 Minute Walk    Row Name 07/24/16 1621         6 Minute Walk   Phase Initial     Distance 1300 feet     Walk Time 6 minutes     # of Rest Breaks 0     MPH 2.46     METS 2.84     RPE 12     Perceived Dyspnea  1     Symptoms Yes (comment)     Comments hip discomfort and calf tightness     Resting HR 90 bpm     Resting BP 120/80     Max Ex. HR 105 bpm     Max Ex. BP 158/80     2 Minute Post BP 130/80       Interval HR   Baseline HR 90     1 Minute HR 99     2 Minute HR 97     3 Minute HR 102     4 Minute HR 99     5 Minute HR 102     6 Minute HR 105     2 Minute Post HR 95     Interval Heart Rate? Yes       Interval Oxygen   Interval Oxygen? Yes     Baseline Oxygen Saturation % 94 %     Baseline Liters of Oxygen 0 L     1 Minute Oxygen Saturation % 92 %     1 Minute Liters of Oxygen 0 L     2 Minute Oxygen Saturation % 90 %     2 Minute Liters of Oxygen 0 L     3 Minute Oxygen Saturation % 91 %     3 Minute Liters of Oxygen 0 L     4 Minute Oxygen Saturation % 91 %     4 Minute Liters of Oxygen 0 L     5 Minute Oxygen Saturation % 90 %     5 Minute Liters of Oxygen 0 L     6 Minute Oxygen Saturation % 90 %     6 Minute Liters of Oxygen 0 L     2 Minute Post Oxygen Saturation % 95 %     2 Minute Post Liters of Oxygen 0 L        Oxygen Initial Assessment:   Oxygen Re-Evaluation:   Oxygen Discharge (  Final Oxygen Re-Evaluation):   Initial Exercise Prescription:     Initial Exercise Prescription - 07/24/16 1600      Date of Initial Exercise RX and Referring Provider   Date 07/24/16   Referring Provider Dr. Laurier Nancy   Level 2  sci fit   Minutes 17     NuStep   Level 2   Minutes 17   METs 1.5     Track   Laps 10   Minutes 17     Prescription Details    Frequency (times per week) 2   Duration Progress to 45 minutes of aerobic exercise without signs/symptoms of physical distress     Intensity   THRR 40-80% of Max Heartrate 61-122   Ratings of Perceived Exertion 11-13   Perceived Dyspnea 0-4     Progression   Progression Continue progressive overload as per policy without signs/symptoms or physical distress.     Resistance Training   Training Prescription Yes   Weight blue bands      Perform Capillary Blood Glucose checks as needed.  Exercise Prescription Changes:     Exercise Prescription Changes    Row Name 08/05/16 1600 08/07/16 1607 08/14/16 1500 08/19/16 1500 08/21/16 1500     Response to Exercise   Blood Pressure (Admit) 120/66 126/72 122/64 112/60 140/70   Blood Pressure (Exercise) 144/70 120/80 130/70 152/76 150/80   Blood Pressure (Exit) 130/74 120/68 140/80 114/60 108/78   Heart Rate (Admit) 96 bpm 99 bpm 89 bpm 94 bpm 83 bpm   Heart Rate (Exercise) 109 bpm 105 bpm 103 bpm 133 bpm 93 bpm   Heart Rate (Exit) 104 bpm 97 bpm 95 bpm 99 bpm 96 bpm   Oxygen Saturation (Admit) 91 % 89 % 91 % 93 % 94 %   Oxygen Saturation (Exercise) 90 % 92 % 88 % 90 % 93 %   Oxygen Saturation (Exit) 90 % 94 % 90 % 92 % 93 %   Rating of Perceived Exertion (Exercise) 13 11 13 13 11    Perceived Dyspnea (Exercise) 1 1 1 1  0   Duration Progress to 45 minutes of aerobic exercise without signs/symptoms of physical distress Progress to 45 minutes of aerobic exercise without signs/symptoms of physical distress Progress to 45 minutes of aerobic exercise without signs/symptoms of physical distress Progress to 45 minutes of aerobic exercise without signs/symptoms of physical distress Progress to 45 minutes of aerobic exercise without signs/symptoms of physical distress   Intensity -  40-80% HRR -  40-80% HRR THRR unchanged THRR unchanged THRR unchanged     Progression   Progression  -  - Continue to progress workloads to maintain intensity without  signs/symptoms of physical distress. Continue to progress workloads to maintain intensity without signs/symptoms of physical distress. Continue to progress workloads to maintain intensity without signs/symptoms of physical distress.     Resistance Training   Training Prescription Yes Yes Yes Yes Yes   Weight blue bands blue bands blue bands blue bands blue bands   Reps 10-12  10 minutes of strength training 10-12  10 minutes of strength training 10-12  10 minutes of strength training 10-12  10 minutes of strength training 10-12  10 minutes of strength training     Interval Training   Interval Training No No No No No     Bike   Level 2 4  - 5 5   Minutes 17 17  - 17 17  NuStep   Level 3 4 4 5 5    Minutes 17 17 17 17 17    METs  - 1.6 2.1 2.4 1.7     Track   Laps 14  - 16 10  -   Minutes 17  - 17 17  -     Exercise Review   Progression Yes Yes Yes Yes  -   Row Name 08/26/16 1500 08/28/16 1500 09/02/16 1500 09/04/16 1500 09/09/16 1504     Response to Exercise   Blood Pressure (Admit) 102/72 98/52 140/90 124/68 110/64   Blood Pressure (Exercise) 128/70 140/70 142/80 140/80 142/76   Blood Pressure (Exit) 112/60 110/60 134/74 150/64 128/74   Heart Rate (Admit) 89 bpm 85 bpm 62 bpm 66 bpm 84 bpm   Heart Rate (Exercise) 96 bpm 100 bpm 87 bpm 97 bpm 99 bpm   Heart Rate (Exit) 98 bpm 98 bpm 55 bpm 93 bpm 98 bpm   Oxygen Saturation (Admit) 95 % 92 % 95 % 94 % 93 %   Oxygen Saturation (Exercise) 91 % 90 % 92 % 93 % 90 %   Oxygen Saturation (Exit) 93 % 94 % 94 % 90 % 93 %   Rating of Perceived Exertion (Exercise) 12 12 12 12 12    Perceived Dyspnea (Exercise) 2 1 1 2 2    Duration Progress to 45 minutes of aerobic exercise without signs/symptoms of physical distress Progress to 45 minutes of aerobic exercise without signs/symptoms of physical distress Progress to 45 minutes of aerobic exercise without signs/symptoms of physical distress Progress to 45 minutes of aerobic exercise  without signs/symptoms of physical distress Progress to 45 minutes of aerobic exercise without signs/symptoms of physical distress   Intensity THRR unchanged THRR unchanged THRR unchanged THRR unchanged THRR unchanged     Progression   Progression Continue to progress workloads to maintain intensity without signs/symptoms of physical distress. Continue to progress workloads to maintain intensity without signs/symptoms of physical distress. Continue to progress workloads to maintain intensity without signs/symptoms of physical distress. Continue to progress workloads to maintain intensity without signs/symptoms of physical distress. Continue to progress workloads to maintain intensity without signs/symptoms of physical distress.     Resistance Training   Training Prescription Yes Yes Yes Yes Yes   Weight blue bands blue bands blue bands blue bands blue bands   Reps 10-12  10 minutes of strength training 10-12  10 minutes of strength training 10-12  10 minutes of strength training 10-12  10 minutes of strength training 10-15   Time  -  -  -  - 10 Minutes     Interval Training   Interval Training No No No No No     Bike   Level 5 5 5 6 6    Minutes 17 17 17 15 17      NuStep   Level 5  - 6 6 6    Minutes 17  - 17 17 17    METs 1.6  - 1.8 2.4 2.1     Track   Laps 15 16 16   - 16   Minutes 17 17 17   - 17     Home Exercise Plan   Plans to continue exercise at  -  - Home  -  -   Frequency  -  - Add 2 additional days to program exercise sessions.  -  -     Exercise Review   Progression  -  - Yes Yes -   Row Name  09/11/16 1619 09/16/16 1534 09/18/16 1548         Response to Exercise   Blood Pressure (Admit) 106/80 122/62 110/64     Blood Pressure (Exercise) 126/80 140/80 158/88     Blood Pressure (Exit) 118/78 116/68 108/68     Heart Rate (Admit) 99 bpm 91 bpm 88 bpm     Heart Rate (Exercise) 103 bpm 108 bpm 113 bpm     Heart Rate (Exit) 99 bpm 100 bpm 84 bpm     Oxygen Saturation  (Admit) 93 % 93 % 94 %     Oxygen Saturation (Exercise) 90 % 92 % 93 %     Oxygen Saturation (Exit) 93 % 92 % 92 %     Rating of Perceived Exertion (Exercise) 12 12 12      Perceived Dyspnea (Exercise) 2 2 2      Duration Progress to 45 minutes of aerobic exercise without signs/symptoms of physical distress Progress to 45 minutes of aerobic exercise without signs/symptoms of physical distress Progress to 45 minutes of aerobic exercise without signs/symptoms of physical distress     Intensity THRR unchanged THRR unchanged THRR unchanged       Progression   Progression Continue to progress workloads to maintain intensity without signs/symptoms of physical distress. Continue to progress workloads to maintain intensity without signs/symptoms of physical distress. Continue to progress workloads to maintain intensity without signs/symptoms of physical distress.       Resistance Training   Training Prescription Yes Yes Yes     Weight blue bands blue bands blue bands     Reps 10-15 10-15 10-15     Time 10 Minutes 10 Minutes 10 Minutes       Interval Training   Interval Training No No No       Bike   Level  - 6 7     Minutes  - 17 17       NuStep   Level 6 6 6      Minutes 17 17 17      METs 1.9 2 2.1       Track   Laps 14 15  -     Minutes 17 17  -       Exercise Review   Progression  -  - Yes        Exercise Comments:     Exercise Comments    Row Name 08/05/16 9191 08/25/16 1626 09/02/16 1647       Exercise Comments Patient's first day of rehab is today Patient has attended 5 sessions. Progressing well. Very open to working hard and increasing intensities. Sometimes needs to be reminded on working at the appropriate level. Home exercise completed        Exercise Goals and Review:   Exercise Goals Re-Evaluation :     Exercise Goals Re-Evaluation    Row Name 09/22/16 1203 09/22/16 1205           Exercise Goal Re-Evaluation   Exercise Goals Review Increase Physical  Activity;Increase Strenth and Stamina  -      Comments Patient is progressing well in Pulmonary Rehab. Patient is open and motivated to increasing workload intensities. Will cont. to monitor and progress as appropriate.  -      Expected Outcomes  - Patient will cont. to increase endurance and stamina through the exercises here at rehab. He will also practice his breathing techinques to become less short of breath on exertion.  Discharge Exercise Prescription (Final Exercise Prescription Changes):     Exercise Prescription Changes - 09/18/16 1548      Response to Exercise   Blood Pressure (Admit) 110/64   Blood Pressure (Exercise) 158/88   Blood Pressure (Exit) 108/68   Heart Rate (Admit) 88 bpm   Heart Rate (Exercise) 113 bpm   Heart Rate (Exit) 84 bpm   Oxygen Saturation (Admit) 94 %   Oxygen Saturation (Exercise) 93 %   Oxygen Saturation (Exit) 92 %   Rating of Perceived Exertion (Exercise) 12   Perceived Dyspnea (Exercise) 2   Duration Progress to 45 minutes of aerobic exercise without signs/symptoms of physical distress   Intensity THRR unchanged     Progression   Progression Continue to progress workloads to maintain intensity without signs/symptoms of physical distress.     Resistance Training   Training Prescription Yes   Weight blue bands   Reps 10-15   Time 10 Minutes     Interval Training   Interval Training No     Bike   Level 7   Minutes 17     NuStep   Level 6   Minutes 17   METs 2.1     Exercise Review   Progression Yes      Nutrition:  Target Goals: Understanding of nutrition guidelines, daily intake of sodium <1512m, cholesterol <2044m calories 30% from fat and 7% or less from saturated fats, daily to have 5 or more servings of fruits and vegetables.  Biometrics:     Pre Biometrics - 07/24/16 1621      Pre Biometrics   Grip Strength 44 kg       Nutrition Therapy Plan and Nutrition Goals:     Nutrition Therapy & Goals -  08/12/16 1022      Nutrition Therapy   Diet Carb Modified, Therapeutic Lifestyle Changes     Personal Nutrition Goals   Nutrition Goal 1-2 lb wt loss/week to a wt loss goal of 6-24 lb [facility] [place] [place] regarding individualized specific dietary modifications aiming towards targeted core components such as weight, hypertension, lipid management, diabetes, heart failure and other comorbidities.   Expected Outcomes Short Term Goal: Understand basic principles of dietary content, such as calories, fat, sodium, cholesterol and nutrients.;Long Term Goal: Adherence to prescribed nutrition plan.      Nutrition Discharge: Rate Your Plate Scores:     Nutrition Assessments - 08/12/16 1014      Rate Your Plate Scores   Pre Score 47      Nutrition Goals Re-Evaluation:   Nutrition Goals Discharge (Final Nutrition Goals Re-Evaluation):   Psychosocial: Target Goals: Acknowledge presence or absence of significant depression and/or stress, maximize coping skills, provide positive support system. Participant is able to verbalize types and ability to use techniques and skills needed for reducing stress and depression.  Initial Review & Psychosocial Screening:     Initial Psych Review & Screening - 07/21/16 [id number]     Barriers   Psychosocial barriers to participate in program There are no identifiable barriers or psychosocial needs.     Screening Interventions   Interventions Encouraged to exercise      Quality of Life Scores:   PHQ-9: Recent Review Flowsheet Data    [facility]   Decreased Interest 0   Down, Depressed, Hopeless  0   PHQ - 2 Score 0     Interpretation of Total Score  Total Score Depression Severity:  1-4 = Minimal depression, 5-9 = Mild depression, 10-14 = Moderate depression, 15-19 = Moderately severe  depression, 20-27 = Severe depression   Psychosocial Evaluation and Intervention:     Psychosocial Evaluation - 07/21/16 1503      Psychosocial Evaluation & Interventions   Interventions Encouraged to exercise with the program and follow exercise prescription      Psychosocial Re-Evaluation:     Psychosocial Re-Evaluation    [place] Name 08/05/16 1631 08/26/16 0911 09/18/16 0855         Psychosocial Re-Evaluation   Current issues with  -  - None Identified     Comments no psychosocial issues identified no psychosocial issues identified  -     Interventions Encouraged to attend Pulmonary Rehabilitation for the exercise Encouraged to attend Pulmonary Rehabilitation for the exercise Encouraged to attend Pulmonary Rehabilitation for the exercise     Continue Psychosocial Services  No No No Follow up required        Psychosocial Discharge (Final Psychosocial Re-Evaluation):     Psychosocial Re-Evaluation - 09/18/16 0855      Psychosocial Re-Evaluation   Current issues with None Identified   Interventions Encouraged to attend Pulmonary Rehabilitation for the exercise   Continue Psychosocial Services  No Follow up required      Education: Education Goals: Education classes will be provided on a weekly basis, covering required topics. Participant will state understanding/return demonstration of topics presented.  Learning Barriers/Preferences:   Education Topics: Risk Factor Reduction:  -Group instruction that is supported by a PowerPoint presentation. Instructor discusses the definition of a risk factor, different risk factors for pulmonary disease, and how the heart and lungs work together.     Nutrition for Pulmonary Patient:  -Group instruction provided by PowerPoint slides, verbal discussion, and written materials to support subject matter. The instructor gives an explanation and review of healthy diet recommendations, which includes a discussion on weight management,  recommendations for fruit and vegetable consumption, as well as protein, fluid, caffeine, fiber, sodium, sugar, and alcohol. Tips for eating when patients are short of breath are discussed.   Pursed Lip Breathing:  -Group instruction that is supported by demonstration and informational handouts. Instructor discusses the benefits of pursed lip and diaphragmatic breathing and detailed demonstration on how to preform both.     Oxygen Safety:  -Group instruction provided by PowerPoint, verbal discussion, and written material to support subject matter. There is an overview of "What is Oxygen" and "Why do we need it".  Instructor also reviews how to create a safe environment for oxygen use, the importance of using oxygen as prescribed, and the risks of noncompliance. There is a brief discussion on traveling with oxygen and resources the patient may utilize.   Oxygen Equipment:  -Group instruction provided by [facility] Staff utilizing handouts, written materials, and equipment demonstrations.   Signs and Symptoms:  -Group instruction provided by written material and verbal discussion to support subject matter. Warning signs and symptoms of infection, stroke, and heart attack are reviewed and when to call the physician/911 reinforced. Tips for preventing the spread of infection discussed. Flowsheet Row PULMONARY REHAB OTHER RESPIRATORY from 09/18/2016 in [place]  Date  08/14/16  Educator  rn  Instruction Review Code  2- meets goals/outcomes      Advanced Directives:  -Group instruction provided by verbal instruction  and written material to support subject matter. Instructor reviews Advanced Directive laws and proper instruction for filling out document.   Pulmonary Video:  -Group video education that reviews the importance of medication and oxygen compliance, exercise, good nutrition, pulmonary hygiene, and pursed lip and diaphragmatic breathing for the  pulmonary patient. Flowsheet Row PULMONARY REHAB OTHER RESPIRATORY from 09/18/2016 in [place]  Date  09/04/16  Educator  STAFF  Instruction Review Code  2- meets goals/outcomes      Exercise for the Pulmonary Patient:  -Group instruction that is supported by a PowerPoint presentation. Instructor discusses benefits of exercise, core components of exercise, frequency, duration, and intensity of an exercise routine, importance of utilizing pulse oximetry during exercise, safety while exercising, and options of places to exercise outside of rehab.   Flowsheet Row PULMONARY REHAB OTHER RESPIRATORY from 09/18/2016 in [place]  Date  08/28/16  Educator  EP  Instruction Review Code  2- meets goals/outcomes      Pulmonary Medications:  -Verbally interactive group education provided by instructor with focus on inhaled medications and proper administration. Flowsheet Row PULMONARY REHAB OTHER RESPIRATORY from 09/18/2016 in [place]  Date  08/07/16  Educator  Pharm  Instruction Review Code  2- meets goals/outcomes      Anatomy and Physiology of the Respiratory System and Intimacy:  -Group instruction provided by PowerPoint, verbal discussion, and written material to support subject matter. Instructor reviews respiratory cycle and anatomical components of the respiratory system and their functions. Instructor also reviews differences in obstructive and restrictive respiratory diseases with examples of each. Intimacy, Sex, and Sexuality differences are reviewed with a discussion on how relationships can change when diagnosed with pulmonary disease. Common sexual concerns are reviewed. Flowsheet Row PULMONARY REHAB OTHER RESPIRATORY from 09/18/2016 in [place]  Date  09/18/16  Educator  Shahad [place]  Instruction Review Code  2- meets goals/outcomes      Knowledge  Questionnaire Score:   Core Components/Risk Factors/Patient Goals at Admission:     Personal Goals and Risk Factors at Admission - 07/21/16 1457      Core Components/Risk Factors/Patient Goals on Admission    Weight Management Yes;Obesity   Intervention Obesity: Provide education and appropriate resources to help participant work on and attain dietary goals.;Weight Management/Obesity: Establish reasonable short term and long term weight goals.   Improve shortness of breath with ADL's Yes   Intervention Provide education, individualized exercise plan and daily activity instruction to help decrease symptoms of SOB with activities of daily living.   Expected Outcomes Short Term: Achieves a reduction of symptoms when performing activities of daily living.   Develop more efficient breathing techniques such as purse lipped breathing and diaphragmatic breathing; and practicing self-pacing with activity Yes   Intervention Provide education, demonstration and support about specific breathing techniuqes utilized for more efficient breathing. Include techniques such as pursed lipped breathing, diaphragmatic breathing and self-pacing activity.   Expected Outcomes Short Term: Participant will be able to demonstrate and use breathing techniques as needed throughout daily activities.   Increase knowledge of respiratory medications and ability to use respiratory devices properly  Yes   Intervention Provide education and demonstration as needed of appropriate use of medications, inhalers, and oxygen therapy.   Expected Outcomes Short Term: Achieves understanding of medications use. Understands that oxygen is a medication prescribed by physician. Demonstrates appropriate use of inhaler and oxygen therapy.   Diabetes  Yes   Intervention Provide education about signs/symptoms and action to take for hypo/hyperglycemia.;Provide education about proper nutrition, including hydration, and aerobic/resistive exercise  prescription along with prescribed medications to achieve blood glucose in normal ranges: Fasting glucose 65-99 mg/dL   Expected Outcomes Short Term: Participant verbalizes understanding of the signs/symptoms and immediate care of hyper/hypoglycemia, proper foot care and importance of medication, aerobic/resistive exercise and nutrition plan for blood glucose control.;Long Term: Attainment of HbA1C < 7%.      Core Components/Risk Factors/Patient Goals Review:      Goals and Risk Factor Review    Row Name 08/05/16 1630 08/26/16 0909 09/18/16 0851         Core Components/Risk Factors/Patient Goals Review   Personal Goals Review Weight Management/Obesity;Increase Strength and Stamina;Improve shortness of breath with ADL's;Diabetes;Develop more efficient breathing techniques such as purse lipped breathing and diaphragmatic breathing and practicing self-pacing with activity.;Increase knowledge of respiratory medications and ability to use respiratory devices properly. Weight Management/Obesity;Increase Strength and Stamina;Diabetes;Develop more efficient breathing techniques such as purse lipped breathing and diaphragmatic breathing and practicing self-pacing with activity.;Increase knowledge of respiratory medications and ability to use respiratory devices properly. Weight Management/Obesity;Develop more efficient breathing techniques such as purse lipped breathing and diaphragmatic breathing and practicing self-pacing with activity.;Increase knowledge of respiratory medications and ability to use respiratory devices properly.     Review First day of exercise, too early to see progress progressing well, involved, asking questions, seems interested in what our program has to offer, no weight loss No weight loss, maintaining his diabetes well with exercise, workloads increases are tolerated well, attending education classes and asking lots of questions     Expected Outcomes should see progress in next 30  days continue to increase strength and knowledge base expect great progress, he is committed        Core Components/Risk Factors/Patient Goals at Discharge (Final Review):      Goals and Risk Factor Review - 09/18/16 0851      Core Components/Risk Factors/Patient Goals Review   Personal Goals Review Weight Management/Obesity;Develop more efficient breathing techniques such as purse lipped breathing and diaphragmatic breathing and practicing self-pacing with activity.;Increase knowledge of respiratory medications and ability to use respiratory devices properly.   Review No weight loss, maintaining his diabetes well with exercise, workloads increases are tolerated well, attending education classes and asking lots of questions   Expected Outcomes expect great progress, he is committed      ITP Comments:   Comments: ITP REVIEW Pt is making expected progress toward pulmonary rehab goals after completing 13 sessions. Recommend continued exercise, life style modification, education, and utilization of breathing techniques to increase stamina and strength and decrease shortness of breath with exertion.

## 2016-09-23 NOTE — Progress Notes (Signed)
Daily Session Note  Patient Details  Name: Samuel Watkins MRN: [id number] Date of Birth: Jun 14, 1946 Referring Provider:   April Manson Pulmonary Rehab Walk Test from 07/24/2016 in [place]  Referring Provider  Dr. Gwenette Greet      Encounter Date: 09/23/2016  Check In:     Session Check In - 09/23/16 1407      Check-In   Location [facility]   Staff Present Su Hilt, MS, ACSM RCEP, Exercise Physiologist;Portia Rollene Rotunda, RN, BSN   Supervising physician immediately available to respond to emergencies [facility] immediately available   Physician(s) Dr. Maylene Roes   Medication changes reported     No   Fall or balance concerns reported    No   Tobacco Cessation No Change   Warm-up and Cool-down Performed as group-led instruction   Resistance Training Performed Yes   VAD Patient? No     Pain Assessment   Currently in Pain? No/denies   Multiple Pain Sites No      Capillary Blood Glucose: No results found for this or any previous visit (from the past 24 hour(s)).      Exercise Prescription Changes - 09/23/16 1600      Response to Exercise   Blood Pressure (Admit) 110/74   Blood Pressure (Exercise) 138/78   Blood Pressure (Exit) 120/70   Heart Rate (Admit) 87 bpm   Heart Rate (Exercise) 104 bpm   Heart Rate (Exit) 95 bpm   Oxygen Saturation (Admit) 93 %   Oxygen Saturation (Exercise) 91 %   Oxygen Saturation (Exit) 95 %   Rating of Perceived Exertion (Exercise) 12   Perceived Dyspnea (Exercise) 2   Duration Continue with 45 min of aerobic exercise without signs/symptoms of physical distress.   Intensity THRR unchanged     Progression   Progression Continue to progress workloads to maintain intensity without signs/symptoms of physical distress.     Resistance Training   Training Prescription Yes   Weight blue bands   Reps 10-15   Time 10 Minutes     Interval Training   Interval Training No     Bike   Level  7   Minutes 17     NuStep   Level 7   Minutes 17   METs 2.2     Track   Laps 15   Minutes 17     Exercise Review   Progression Yes      History  Smoking Status  . Former Smoker  . Packs/day: 1.00  . Years: 50.00  . Types: Cigarettes  . Quit date: 07/14/2010  Smokeless Tobacco  . Never Used    Comment: Counseled to remain smoke free.    Goals Met:  Exercise tolerated well No report of cardiac concerns or symptoms Strength training completed today  Goals Unmet:  Not Applicable  Comments: Service time is from 1330 to 1515    Dr. Rush Farmer is Medical Director for Pulmonary Rehab at [facility].

## 2016-09-25 ENCOUNTER — Encounter (HOSPITAL_COMMUNITY)
Admission: RE | Admit: 2016-09-25 | Discharge: 2016-09-25 | Disposition: A | Discharge status: Home / Self Care | Payer: Non-veteran care | Source: Ambulatory Visit | Attending: Pulmonary Disease | Admitting: Pulmonary Disease

## 2016-09-25 VITALS — Wt 235.9 lb

## 2016-09-25 DIAGNOSIS — J439 Emphysema, unspecified: Secondary | ICD-10-CM

## 2016-09-25 DIAGNOSIS — R0600 Dyspnea, unspecified: Secondary | ICD-10-CM | POA: Diagnosis not present

## 2016-09-25 NOTE — Progress Notes (Signed)
Daily Session Note  Patient Details  Name: Samuel Watkins MRN: [id number] Date of Birth: 1945-07-20 Referring Provider:     Pulmonary Rehab Walk Test from 07/24/2016 in [place]  Referring Provider  Dr. Gwenette Greet      Encounter Date: 09/25/2016  Check In:     Session Check In - 09/25/16 1354      Check-In   [facility]   Staff Present Su Hilt, MS, ACSM RCEP, Exercise Physiologist;Joan Leonia Reeves, RN, BSN;Rayne Loiseau Ysidro Evert, RN;Portia Rollene Rotunda, RN, BSN   Supervising physician immediately available to respond to emergencies [facility] immediately available   Physician(s) Dr. Sloan Leiter   Medication changes reported     No   Fall or balance concerns reported    No   Tobacco Cessation No Change   Warm-up and Cool-down Performed as group-led instruction   Resistance Training Performed Yes   VAD Patient? No     Pain Assessment   Currently in Pain? No/denies   Multiple Pain Sites No      Capillary Blood Glucose: No results found for this or any previous visit (from the past 24 hour(s)).     POCT Glucose - 09/25/16 1550      POCT Blood Glucose   Pre-Exercise 96 mg/dL  pt given bananna abd gingerale before exercise   Post-Exercise 121 mg/dL         Exercise Prescription Changes - 09/25/16 1500      Response to Exercise   Blood Pressure (Admit) 112/78   Blood Pressure (Exercise) 130/80   Blood Pressure (Exit) 106/60   Heart Rate (Admit) 89 bpm   Heart Rate (Exercise) 99 bpm   Heart Rate (Exit) 94 bpm   Oxygen Saturation (Admit) 92 %   Oxygen Saturation (Exercise) 93 %   Oxygen Saturation (Exit) 93 %   Rating of Perceived Exertion (Exercise) 12   Perceived Dyspnea (Exercise) 2   Duration Continue with 45 min of aerobic exercise without signs/symptoms of physical distress.   Intensity THRR unchanged     Progression   Progression Continue to progress workloads to maintain intensity without signs/symptoms of  physical distress.     Resistance Training   Training Prescription Yes   Weight blue bands   Reps 10-15   Time 10 Minutes     Interval Training   Interval Training No     Bike   Level 7   Minutes 17     Track   Laps 15   Minutes 17      History  Smoking Status  . Former Smoker  . Packs/day: 1.00  . Years: 50.00  . Types: Cigarettes  . Quit date: 07/14/2010  Smokeless Tobacco  . Never Used    Comment: Counseled to remain smoke free.    Goals Met:  Exercise tolerated well No report of cardiac concerns or symptoms Strength training completed today  Goals Unmet:  Not Applicable  Comments: Service time is from 1330 to 1535    Dr. Rush Farmer is Medical Director for Pulmonary Rehab at [facility].

## 2016-09-30 ENCOUNTER — Encounter (HOSPITAL_COMMUNITY)
Admission: RE | Admit: 2016-09-30 | Discharge: 2016-09-30 | Disposition: A | Discharge status: Home / Self Care | Payer: Non-veteran care | Source: Ambulatory Visit | Attending: Pulmonary Disease | Admitting: Pulmonary Disease

## 2016-09-30 VITALS — Wt 234.8 lb

## 2016-09-30 DIAGNOSIS — J439 Emphysema, unspecified: Secondary | ICD-10-CM

## 2016-09-30 DIAGNOSIS — R0600 Dyspnea, unspecified: Secondary | ICD-10-CM | POA: Diagnosis not present

## 2016-09-30 NOTE — Progress Notes (Signed)
Daily Session Note  Patient Details  Name: Samuel Watkins MRN: [id number] Date of Birth: Jan 03, 1946 Referring Provider:     Pulmonary Rehab Walk Test from 07/24/2016 in [place]  Referring Provider  Dr. Gwenette Greet      Encounter Date: 09/30/2016  Check In:     Session Check In - 09/30/16 1330      Check-In   [facility]   Staff Present Su Hilt, MS, ACSM RCEP, Exercise Physiologist;Joan Leonia Reeves, RN, BSN;Lisa Ysidro Evert, RN;Portia Rollene Rotunda, RN, BSN   Supervising physician immediately available to respond to emergencies [facility] immediately available   Physician(s) Dr. Candiss Norse   Medication changes reported     No   Fall or balance concerns reported    No   Tobacco Cessation No Change   Warm-up and Cool-down Performed as group-led instruction   Resistance Training Performed Yes   VAD Patient? No     Pain Assessment   Currently in Pain? No/denies   Multiple Pain Sites No      Capillary Blood Glucose: No results found for this or any previous visit (from the past 24 hour(s)).      Exercise Prescription Changes - 09/30/16 1500      Response to Exercise   Blood Pressure (Admit) 126/78   Blood Pressure (Exercise) 160/90   Blood Pressure (Exit) 112/60   Heart Rate (Admit) 68 bpm   Heart Rate (Exercise) 97 bpm   Heart Rate (Exit) 88 bpm   Oxygen Saturation (Admit) 93 %   Oxygen Saturation (Exercise) 90 %   Oxygen Saturation (Exit) 90 %   Rating of Perceived Exertion (Exercise) 12   Perceived Dyspnea (Exercise) 2   Duration Continue with 45 min of aerobic exercise without signs/symptoms of physical distress.   Intensity THRR unchanged     Progression   Progression Continue to progress workloads to maintain intensity without signs/symptoms of physical distress.     Resistance Training   Training Prescription Yes   Weight blue bands   Reps 10-15   Time 10 Minutes     Interval Training   Interval  Training No     Bike   Level 7   Minutes 17     NuStep   Level 7   Minutes 17   METs 2.3     Track   Laps 15   Minutes 17      History  Smoking Status  . Former Smoker  . Packs/day: 1.00  . Years: 50.00  . Types: Cigarettes  . Quit date: 07/14/2010  Smokeless Tobacco  . Never Used    Comment: Counseled to remain smoke free.    Goals Met:  Exercise tolerated well No report of cardiac concerns or symptoms Strength training completed today  Goals Unmet:  Not Applicable  Comments: Service time is from 1:30p to 3:00p    Dr. Rush Farmer is Medical Director for Pulmonary Rehab at [facility].

## 2016-10-02 ENCOUNTER — Encounter (HOSPITAL_COMMUNITY): Payer: Non-veteran care

## 2016-10-02 DIAGNOSIS — K219 Gastro-esophageal reflux disease without esophagitis: Secondary | ICD-10-CM | POA: Diagnosis not present

## 2016-10-02 DIAGNOSIS — Z7984 Long term (current) use of oral hypoglycemic drugs: Secondary | ICD-10-CM | POA: Diagnosis not present

## 2016-10-02 DIAGNOSIS — E1149 Type 2 diabetes mellitus with other diabetic neurological complication: Secondary | ICD-10-CM | POA: Diagnosis not present

## 2016-10-02 DIAGNOSIS — J449 Chronic obstructive pulmonary disease, unspecified: Secondary | ICD-10-CM | POA: Diagnosis not present

## 2016-10-07 ENCOUNTER — Encounter (HOSPITAL_COMMUNITY)
Admission: RE | Admit: 2016-10-07 | Discharge: 2016-10-07 | Disposition: A | Discharge status: Home / Self Care | Payer: Non-veteran care | Source: Ambulatory Visit | Attending: Pulmonary Disease | Admitting: Pulmonary Disease

## 2016-10-07 VITALS — Wt 232.6 lb

## 2016-10-07 DIAGNOSIS — R0600 Dyspnea, unspecified: Secondary | ICD-10-CM | POA: Diagnosis not present

## 2016-10-07 DIAGNOSIS — J439 Emphysema, unspecified: Secondary | ICD-10-CM

## 2016-10-07 NOTE — Progress Notes (Signed)
Daily Session Note  Patient Details  Name: Samuel Watkins MRN: [id number] Date of Birth: [birth year] Referring Provider:     Pulmonary Rehab Walk Test from 07/24/2016 in [place]  Referring Provider  Dr. Gwenette Greet      Encounter Date: 10/07/2016  Check In:     Session Check In - 10/07/16 1330      Check-In   Location [facility]   Staff Present Rosebud Poles, RN, Luisa Hart, RN, BSN   Supervising physician immediately available to respond to emergencies [facility] immediately available   Physician(s) Dr. Tana Coast   Medication changes reported     No   Fall or balance concerns reported    No   Tobacco Cessation No Change   Warm-up and Cool-down Performed as group-led instruction   Resistance Training Performed Yes   VAD Patient? No     Pain Assessment   Currently in Pain? No/denies   Multiple Pain Sites No      Capillary Blood Glucose: No results found for this or any previous visit (from the past 24 hour(s)).      Exercise Prescription Changes - 10/07/16 1500      Response to Exercise   Blood Pressure (Admit) 118/62   Blood Pressure (Exercise) 122/74   Blood Pressure (Exit) 104/68   Heart Rate (Admit) 79 bpm   Heart Rate (Exercise) 96 bpm   Heart Rate (Exit) 93 bpm   Oxygen Saturation (Admit) 93 %   Oxygen Saturation (Exercise) 91 %   Oxygen Saturation (Exit) 91 %   Rating of Perceived Exertion (Exercise) 13   Perceived Dyspnea (Exercise) 2   Duration Continue with 45 min of aerobic exercise without signs/symptoms of physical distress.   Intensity THRR unchanged     Progression   Progression Continue to progress workloads to maintain intensity without signs/symptoms of physical distress.     Resistance Training   Training Prescription Yes   Weight blue bands   Reps 10-15   Time 10 Minutes     Interval Training   Interval Training No     Bike   Level 7   Minutes 17     NuStep   Level 7   Minutes 17   METs 2.2     Track   Laps 15   Minutes 17      History  Smoking Status  . Former Smoker  . Packs/day: 1.00  . Years: 50.00  . Types: Cigarettes  . Quit date: 07/14/2010  Smokeless Tobacco  . Never Used    Comment: Counseled to remain smoke free.    Goals Met:  Exercise tolerated well No report of cardiac concerns or symptoms Strength training completed today  Goals Unmet:  Not Applicable  Comments: Service time is from 1:30p to 3:00p    Dr. Rush Farmer is Medical Director for Pulmonary Rehab at [facility].

## 2016-10-09 ENCOUNTER — Encounter (HOSPITAL_COMMUNITY): Payer: Non-veteran care

## 2016-10-14 ENCOUNTER — Encounter (HOSPITAL_COMMUNITY)
Admission: RE | Admit: 2016-10-14 | Discharge: 2016-10-14 | Disposition: A | Discharge status: Home / Self Care | Payer: Non-veteran care | Source: Ambulatory Visit | Attending: Pulmonary Disease | Admitting: Pulmonary Disease

## 2016-10-14 VITALS — Wt 234.3 lb

## 2016-10-14 DIAGNOSIS — J439 Emphysema, unspecified: Secondary | ICD-10-CM

## 2016-10-14 DIAGNOSIS — R0600 Dyspnea, unspecified: Secondary | ICD-10-CM | POA: Insufficient documentation

## 2016-10-14 NOTE — Progress Notes (Signed)
Daily Session Note  Patient Details  Name: Samuel Watkins MRN: [id number] Date of Birth: [birth year] Referring Provider:     Pulmonary Rehab Walk Test from 07/24/2016 in [place]  Referring Provider  Dr. Gwenette Greet      Encounter Date: 10/14/2016  Check In:     Session Check In - 10/14/16 1545      Check-In   Location [facility]   Staff Present Su Hilt, MS, ACSM RCEP, Exercise Physiologist;Sutton Hirsch Ysidro Evert, RN;Portia Rollene Rotunda, RN, BSN   Supervising physician immediately available to respond to emergencies [facility] immediately available   Physician(s) Dr. Doyle Askew   Medication changes reported     No   Fall or balance concerns reported    No   Tobacco Cessation No Change   Warm-up and Cool-down Performed as group-led instruction   Resistance Training Performed Yes   VAD Patient? No     Pain Assessment   Currently in Pain? No/denies   Multiple Pain Sites No      Capillary Blood Glucose: No results found for this or any previous visit (from the past 24 hour(s)).      Exercise Prescription Changes - 10/14/16 1500      Response to Exercise   Blood Pressure (Admit) 140/68   Blood Pressure (Exercise) 136/68   Blood Pressure (Exit) 114/70   Heart Rate (Admit) 82 bpm   Heart Rate (Exercise) 101 bpm   Heart Rate (Exit) 86 bpm   Oxygen Saturation (Admit) 96 %   Oxygen Saturation (Exercise) 93 %   Oxygen Saturation (Exit) 92 %   Rating of Perceived Exertion (Exercise) 12   Perceived Dyspnea (Exercise) 2   Duration Continue with 45 min of aerobic exercise without signs/symptoms of physical distress.   Intensity THRR unchanged     Progression   Progression Continue to progress workloads to maintain intensity without signs/symptoms of physical distress.     Resistance Training   Training Prescription Yes   Weight blue bands   Reps 10-15   Time 10 Minutes     Interval Training   Interval Training No     Bike   Level 7   Minutes 17     NuStep   Level 7   Minutes 17   METs 2.2     Track   Laps 14   Minutes 17      History  Smoking Status  . Former Smoker  . Packs/day: 1.00  . Years: 50.00  . Types: Cigarettes  . Quit date: 07/14/2010  Smokeless Tobacco  . Never Used    Comment: Counseled to remain smoke free.    Goals Met:  Exercise tolerated well No report of cardiac concerns or symptoms Strength training completed today  Goals Unmet:  Not Applicable  Comments: Service time is from 1330 to 1455    Dr. Rush Farmer is Medical Director for Pulmonary Rehab at [facility].

## 2016-10-16 ENCOUNTER — Encounter (HOSPITAL_COMMUNITY)
Admission: RE | Admit: 2016-10-16 | Discharge: 2016-10-16 | Disposition: A | Discharge status: Home / Self Care | Payer: Non-veteran care | Source: Ambulatory Visit | Attending: Pulmonary Disease | Admitting: Pulmonary Disease

## 2016-10-16 VITALS — Wt 234.3 lb

## 2016-10-16 DIAGNOSIS — R0600 Dyspnea, unspecified: Secondary | ICD-10-CM | POA: Diagnosis not present

## 2016-10-16 DIAGNOSIS — J439 Emphysema, unspecified: Secondary | ICD-10-CM

## 2016-10-16 NOTE — Progress Notes (Signed)
Daily Session Note  Patient Details  Name: Samuel Watkins MRN: [id number] Date of Birth: [birth year] Referring Provider:     Pulmonary Rehab Walk Test from 07/24/2016 in [place]  Referring Provider  Dr. Gwenette Greet      Encounter Date: 10/16/2016  Check In:     Session Check In - 10/16/16 1352      Check-In   Location [facility]   Staff Present Su Hilt, MS, ACSM RCEP, Exercise Physiologist;Peng Thorstenson Ysidro Evert, RN;Portia Rollene Rotunda, RN, BSN   Supervising physician immediately available to respond to emergencies [facility] immediately available   Physician(s) Dr. Allyson Sabal   Medication changes reported     No   Fall or balance concerns reported    No   Tobacco Cessation No Change   Warm-up and Cool-down Performed as group-led instruction   Resistance Training Performed Yes   VAD Patient? No     Pain Assessment   Currently in Pain? No/denies   Multiple Pain Sites No      Capillary Blood Glucose: No results found for this or any previous visit (from the past 24 hour(s)).     POCT Glucose - 10/16/16 1554      POCT Blood Glucose   Pre-Exercise 196 mg/dL   Post-Exercise 157 mg/dL         Exercise Prescription Changes - 10/16/16 1500      Response to Exercise   Blood Pressure (Admit) 148/64   Blood Pressure (Exercise) 150/80   Blood Pressure (Exit) 122/68   Heart Rate (Admit) 82 bpm   Heart Rate (Exercise) 96 bpm   Heart Rate (Exit) 81 bpm   Oxygen Saturation (Admit) 92 %   Oxygen Saturation (Exercise) 92 %   Oxygen Saturation (Exit) 92 %   Rating of Perceived Exertion (Exercise) 12   Perceived Dyspnea (Exercise) 2   Duration Continue with 45 min of aerobic exercise without signs/symptoms of physical distress.   Intensity THRR unchanged     Progression   Progression Continue to progress workloads to maintain intensity without signs/symptoms of physical distress.     Resistance Training   Training Prescription Yes    Weight blue bands   Reps 10-15   Time 10 Minutes     Interval Training   Interval Training No     Bike   Level 7.5   Minutes 17     NuStep   Level 7   Minutes 17   METs 2.1     Exercise Review   Progression Yes      History  Smoking Status  . Former Smoker  . Packs/day: 1.00  . Years: 50.00  . Types: Cigarettes  . Quit date: 07/14/2010  Smokeless Tobacco  . Never Used    Comment: Counseled to remain smoke free.    Goals Met:  Exercise tolerated well No report of cardiac concerns or symptoms Strength training completed today  Goals Unmet:  Not Applicable  Comments: Service time is from 1330 to 1535    Dr. Rush Farmer is Medical Director for Pulmonary Rehab at [facility].

## 2016-10-21 ENCOUNTER — Encounter (HOSPITAL_COMMUNITY)
Admission: RE | Admit: 2016-10-21 | Discharge: 2016-10-21 | Disposition: A | Discharge status: Home / Self Care | Payer: Non-veteran care | Source: Ambulatory Visit | Attending: Pulmonary Disease | Admitting: Pulmonary Disease

## 2016-10-21 DIAGNOSIS — J439 Emphysema, unspecified: Secondary | ICD-10-CM

## 2016-10-21 NOTE — Progress Notes (Signed)
Pulmonary Individual Treatment Plan  Patient Details  Name: HESHAM WOMAC MRN: [id number] Date of Birth: 1946-03-02 Referring Provider:     Pulmonary Rehab Walk Test from 07/24/2016 in [place]  Referring Provider  Dr. Gwenette Greet      Initial Encounter Date:    Pulmonary Rehab Walk Test from 07/24/2016 in [place]  Date  07/24/16  Referring Provider  Dr. Gwenette Greet      Visit Diagnosis: Pulmonary emphysema, unspecified emphysema type ([place])  Patient's Home Medications on Admission:   Current Outpatient Prescriptions:  .  albuterol (PROVENTIL HFA;VENTOLIN HFA) 108 (90 BASE) MCG/ACT inhaler, Inhale 2 puffs into the lungs 4 (four) times daily as needed for wheezing or shortness of breath., Disp: , Rfl:  .  aspirin 325 MG tablet, Take 325 mg by mouth daily., Disp: , Rfl:  .  atorvastatin (LIPITOR) 80 MG tablet, Take 80 mg by mouth daily.  , Disp: , Rfl:  .  budesonide-formoterol (SYMBICORT) 160-4.5 MCG/ACT inhaler, Inhale 2 puffs into the lungs 2 (two) times daily., Disp: , Rfl:  .  losartan (COZAAR) 50 MG tablet, Take 50 mg by mouth daily.  , Disp: , Rfl:  .  metFORMIN (GLUCOPHAGE) 500 MG tablet, Take 500 mg by mouth 2 (two) times daily with a meal. , Disp: , Rfl:  .  niacin (NIASPAN) 1000 MG CR tablet, Take 1,000 mg by mouth at bedtime.  , Disp: , Rfl:  .  nitroGLYCERIN (NITROSTAT) 0.4 MG SL tablet, Place 0.4 mg under the tongue every 5 (five) minutes as needed.  , Disp: , Rfl:  .  pantoprazole (PROTONIX) 40 MG tablet, Take 40 mg by mouth daily., Disp: , Rfl:  .  Tiotropium Bromide-Olodaterol (STIOLTO RESPIMAT) 2.5-2.5 MCG/ACT AERS, Inhale 2 puffs into the lungs daily., Disp: , Rfl:   Past Medical History: Past Medical History:  Diagnosis Date  . CAD (coronary artery disease)   . Chronic hepatitis B ([place])   . Hyperlipemia   . Hypertension     Tobacco Use: History  Smoking Status  . Former Smoker  . Packs/day: 1.00   . Years: 50.00  . Types: Cigarettes  . Quit date: 07/14/2010  Smokeless Tobacco  . Never Used    Comment: Counseled to remain smoke free.    Labs: Recent Review Flowsheet Data    Labs for ITP Cardiac and Pulmonary Rehab 07/09/2007 07/10/2007 07/17/2007   Cholestrol - 189 ATP III CLASSIFICATION: <200     mg/dL   Desirable 200-239  mg/dL   Borderline High >=240    mg/dL   High -   LDLCALC - 150 Total Cholesterol/HDL:CHD Risk Coronary Heart Disease Risk Table Men   Women 1/2 Average Risk   3.4   3.3(H) -   HDL - 18(L) -   Trlycerides - 105 -   HCO3 22.7 - 27.8(H)   TCO2 24 - 29      Capillary Blood Glucose: No results found for: GLUCAP     POCT Glucose    Row Name 08/07/16 1610 08/14/16 1558 08/19/16 1519 08/26/16 1506 09/02/16 1545     POCT Blood Glucose   Pre-Exercise 163 mg/dL 147 mg/dL 148 mg/dL 114 mg/dL 190 mg/dL   Post-Exercise 170 mg/dL 137 mg/dL 142 mg/dL 114 mg/dL 155 mg/dL   Row Name 09/04/16 1510 09/16/16 1538 09/23/16 1603 09/25/16 1550 10/07/16 1527     POCT Blood Glucose   Pre-Exercise 194 mg/dL 121 mg/dL 93 mg/dL  Given bananna and gingerale96 96 mg/dL  pt given bananna abd gingerale before exercise 146 mg/dL   Post-Exercise 198 mg/dL 114 mg/dL 96 mg/dL  pt drank gingerale 121 mg/dL 160 mg/dL   Row Name 10/14/16 1549 10/16/16 1554           POCT Blood Glucose   Pre-Exercise 179 mg/dL 196 mg/dL      Post-Exercise 144 mg/dL 157 mg/dL         ADL UCSD:   Pulmonary Function Assessment:     Pulmonary Function Assessment - 07/21/16 1448      Breath   Bilateral Breath Sounds Clear   Shortness of Breath Limiting activity      Exercise Target Goals:    Exercise Program Goal: Individual exercise prescription set with THRR, safety & activity barriers. Participant demonstrates ability to understand and report RPE using BORG scale, to self-measure pulse accurately, and to acknowledge the importance of the exercise prescription.  Exercise  Prescription Goal: Starting with aerobic activity 30 plus minutes a day, 3 days per week for initial exercise prescription. Provide home exercise prescription and guidelines that participant acknowledges understanding prior to discharge.  Activity Barriers & Risk Stratification:     Activity Barriers & Cardiac Risk Stratification - 07/21/16 1448      Activity Barriers & Cardiac Risk Stratification   Activity Barriers Shortness of Breath;Back Problems      6 Minute Walk:     6 Minute Walk    Row Name 07/24/16 1621         6 Minute Walk   Phase Initial     Distance 1300 feet     Walk Time 6 minutes     # of Rest Breaks 0     MPH 2.46     METS 2.84     RPE 12     Perceived Dyspnea  1     Symptoms Yes (comment)     Comments hip discomfort and calf tightness     Resting HR 90 bpm     Resting BP 120/80     Max Ex. HR 105 bpm     Max Ex. BP 158/80     2 Minute Post BP 130/80       Interval HR   Baseline HR 90     1 Minute HR 99     2 Minute HR 97     3 Minute HR 102     4 Minute HR 99     5 Minute HR 102     6 Minute HR 105     2 Minute Post HR 95     Interval Heart Rate? Yes       Interval Oxygen   Interval Oxygen? Yes     Baseline Oxygen Saturation % 94 %     Baseline Liters of Oxygen 0 L     1 Minute Oxygen Saturation % 92 %     1 Minute Liters of Oxygen 0 L     2 Minute Oxygen Saturation % 90 %     2 Minute Liters of Oxygen 0 L     3 Minute Oxygen Saturation % 91 %     3 Minute Liters of Oxygen 0 L     4 Minute Oxygen Saturation % 91 %     4 Minute Liters of Oxygen 0 L     5 Minute Oxygen Saturation % 90 %     5 Minute Liters of Oxygen  0 L     6 Minute Oxygen Saturation % 90 %     6 Minute Liters of Oxygen 0 L     2 Minute Post Oxygen Saturation % 95 %     2 Minute Post Liters of Oxygen 0 L        Oxygen Initial Assessment:   Oxygen Re-Evaluation:     Oxygen Re-Evaluation    Row Name 10/20/16 1420             Program Oxygen Prescription    Program Oxygen Prescription None         Home Oxygen   Home Oxygen Device None       Sleep Oxygen Prescription None       Home Exercise Oxygen Prescription None       Home at Rest Exercise Oxygen Prescription None       Compliance with Home Oxygen Use -  not applicable          Oxygen Discharge (Final Oxygen Re-Evaluation):     Oxygen Re-Evaluation - 10/20/16 1420      Program Oxygen Prescription   Program Oxygen Prescription None     Home Oxygen   Home Oxygen Device None   Sleep Oxygen Prescription None   Home Exercise Oxygen Prescription None   Home at Rest Exercise Oxygen Prescription None   Compliance with Home Oxygen Use --  not applicable      Initial Exercise Prescription:     Initial Exercise Prescription - 07/24/16 1600      Date of Initial Exercise RX and Referring Provider   Date 07/24/16   Referring Provider Dr. Gwenette Greet     Bike   Level 2  sci fit   Minutes 17     NuStep   Level 2   Minutes 17   METs 1.5     Track   Laps 10   Minutes 17     Prescription Details   Frequency (times per week) 2   Duration Progress to 45 minutes of aerobic exercise without signs/symptoms of physical distress     Intensity   THRR 40-80% of Max Heartrate 61-122   Ratings of Perceived Exertion 11-13   Perceived Dyspnea 0-4     Progression   Progression Continue progressive overload as per policy without signs/symptoms or physical distress.     Resistance Training   Training Prescription Yes   Weight blue bands      Perform Capillary Blood Glucose checks as needed.  Exercise Prescription Changes:     Exercise Prescription Changes    Row Name 08/05/16 1600 08/07/16 1607 08/14/16 1500 08/19/16 1500 08/21/16 1500     Response to Exercise   Blood Pressure (Admit) 120/66 126/72 122/64 112/60 140/70   Blood Pressure (Exercise) 144/70 120/80 130/70 152/76 150/80   Blood Pressure (Exit) 130/74 120/68 140/80 114/60 108/78   Heart Rate (Admit) 96 bpm 99  bpm 89 bpm 94 bpm 83 bpm   Heart Rate (Exercise) 109 bpm 105 bpm 103 bpm 133 bpm 93 bpm   Heart Rate (Exit) 104 bpm 97 bpm 95 bpm 99 bpm 96 bpm   Oxygen Saturation (Admit) 91 % 89 % 91 % 93 % 94 %   Oxygen Saturation (Exercise) 90 % 92 % 88 % 90 % 93 %   Oxygen Saturation (Exit) 90 % 94 % 90 % 92 % 93 %   Rating of Perceived Exertion (Exercise) 13 11 13 13 11    Perceived  Dyspnea (Exercise) 1 1 1 1  0   Duration Progress to 45 minutes of aerobic exercise without signs/symptoms of physical distress Progress to 45 minutes of aerobic exercise without signs/symptoms of physical distress Progress to 45 minutes of aerobic exercise without signs/symptoms of physical distress Progress to 45 minutes of aerobic exercise without signs/symptoms of physical distress Progress to 45 minutes of aerobic exercise without signs/symptoms of physical distress   Intensity -  40-80% HRR -  40-80% HRR THRR unchanged THRR unchanged THRR unchanged     Progression   Progression  -  - Continue to progress workloads to maintain intensity without signs/symptoms of physical distress. Continue to progress workloads to maintain intensity without signs/symptoms of physical distress. Continue to progress workloads to maintain intensity without signs/symptoms of physical distress.     Resistance Training   Training Prescription Yes Yes Yes Yes Yes   Weight blue bands blue bands blue bands blue bands blue bands   Reps 10-12  10 minutes of strength training 10-12  10 minutes of strength training 10-12  10 minutes of strength training 10-12  10 minutes of strength training 10-12  10 minutes of strength training     Interval Training   Interval Training No No No No No     Bike   Level 2 4  - 5 5   Minutes 17 17  - 17 17     NuStep   Level 3 4 4 5 5    Minutes 17 17 17 17 17    METs  - 1.6 2.1 2.4 1.7     Track   Laps 14  - 16 10  -   Minutes 17  - 17 17  -     Exercise Review   Progression Yes Yes Yes Yes  -   Row  Name 08/26/16 1500 08/28/16 1500 09/02/16 1500 09/04/16 1500 09/09/16 1504     Response to Exercise   Blood Pressure (Admit) 102/72 98/52 140/90 124/68 110/64   Blood Pressure (Exercise) 128/70 140/70 142/80 140/80 142/76   Blood Pressure (Exit) 112/60 110/60 134/74 150/64 128/74   Heart Rate (Admit) 89 bpm 85 bpm 62 bpm 66 bpm 84 bpm   Heart Rate (Exercise) 96 bpm 100 bpm 87 bpm 97 bpm 99 bpm   Heart Rate (Exit) 98 bpm 98 bpm 55 bpm 93 bpm 98 bpm   Oxygen Saturation (Admit) 95 % 92 % 95 % 94 % 93 %   Oxygen Saturation (Exercise) 91 % 90 % 92 % 93 % 90 %   Oxygen Saturation (Exit) 93 % 94 % 94 % 90 % 93 %   Rating of Perceived Exertion (Exercise) 12 12 12 12 12    Perceived Dyspnea (Exercise) 2 1 1 2 2    Duration Progress to 45 minutes of aerobic exercise without signs/symptoms of physical distress Progress to 45 minutes of aerobic exercise without signs/symptoms of physical distress Progress to 45 minutes of aerobic exercise without signs/symptoms of physical distress Progress to 45 minutes of aerobic exercise without signs/symptoms of physical distress Progress to 45 minutes of aerobic exercise without signs/symptoms of physical distress   Intensity THRR unchanged THRR unchanged THRR unchanged THRR unchanged THRR unchanged     Progression   Progression Continue to progress workloads to maintain intensity without signs/symptoms of physical distress. Continue to progress workloads to maintain intensity without signs/symptoms of physical distress. Continue to progress workloads to maintain intensity without signs/symptoms of physical distress. Continue to progress workloads to maintain intensity  without signs/symptoms of physical distress. Continue to progress workloads to maintain intensity without signs/symptoms of physical distress.     Resistance Training   Training Prescription Yes Yes Yes Yes Yes   Weight blue bands blue bands blue bands blue bands blue bands   Reps 10-12  10 minutes of  strength training 10-12  10 minutes of strength training 10-12  10 minutes of strength training 10-12  10 minutes of strength training 10-15   Time  -  -  -  - 10 Minutes     Interval Training   Interval Training No No No No No     Bike   Level 5 5 5 6 6    Minutes 17 17 17 15 17      NuStep   Level 5  - 6 6 6    Minutes 17  - 17 17 17    METs 1.6  - 1.8 2.4 2.1     Track   Laps 15 16 16   - 16   Minutes 17 17 17   - 17     Home Exercise Plan   Plans to continue exercise at  -  - Home  -  -   Frequency  -  - Add 2 additional days to program exercise sessions.  -  -     Exercise Review   Progression  -  - Yes Yes -   Row Name 09/11/16 1619 09/16/16 1534 09/18/16 1548 09/23/16 1600 09/25/16 1500     Response to Exercise   Blood Pressure (Admit) 106/80 122/62 110/64 110/74 112/78   Blood Pressure (Exercise) 126/80 140/80 158/88 138/78 130/80   Blood Pressure (Exit) 118/78 116/68 108/68 120/70 106/60   Heart Rate (Admit) 99 bpm 91 bpm 88 bpm 87 bpm 89 bpm   Heart Rate (Exercise) 103 bpm 108 bpm 113 bpm 104 bpm 99 bpm   Heart Rate (Exit) 99 bpm 100 bpm 84 bpm 95 bpm 94 bpm   Oxygen Saturation (Admit) 93 % 93 % 94 % 93 % 92 %   Oxygen Saturation (Exercise) 90 % 92 % 93 % 91 % 93 %   Oxygen Saturation (Exit) 93 % 92 % 92 % 95 % 93 %   Rating of Perceived Exertion (Exercise) 12 12 12 12 12    Perceived Dyspnea (Exercise) 2 2 2 2 2    Duration Progress to 45 minutes of aerobic exercise without signs/symptoms of physical distress Progress to 45 minutes of aerobic exercise without signs/symptoms of physical distress Progress to 45 minutes of aerobic exercise without signs/symptoms of physical distress Continue with 45 min of aerobic exercise without signs/symptoms of physical distress. Continue with 45 min of aerobic exercise without signs/symptoms of physical distress.   Intensity THRR unchanged THRR unchanged THRR unchanged THRR unchanged THRR unchanged     Progression   Progression  Continue to progress workloads to maintain intensity without signs/symptoms of physical distress. Continue to progress workloads to maintain intensity without signs/symptoms of physical distress. Continue to progress workloads to maintain intensity without signs/symptoms of physical distress. Continue to progress workloads to maintain intensity without signs/symptoms of physical distress. Continue to progress workloads to maintain intensity without signs/symptoms of physical distress.     Resistance Training   Training Prescription Yes Yes Yes Yes Yes   Weight blue bands blue bands blue bands blue bands blue bands   Reps 10-15 10-15 10-15 10-15 10-15   Time 10 Minutes 10 Minutes 10 Minutes 10 Minutes 10 Minutes  Interval Training   Interval Training No No No No No     Bike   Level  - 6 7 7 7    Minutes  - 17 17 17 17      NuStep   Level 6 6 6 7   -   Minutes 17 17 17 17   -   METs 1.9 2 2.1 2.2  -     Track   Laps 14 15  - 15 15   Minutes 17 17  - 17 17     Exercise Review   Progression  -  - Yes Yes  -   Row Name 09/30/16 1500 10/07/16 1500 10/14/16 1500 10/16/16 1500       Response to Exercise   Blood Pressure (Admit) 126/78 118/62 140/68 148/64    Blood Pressure (Exercise) 160/90 122/74 136/68 150/80    Blood Pressure (Exit) 112/60 104/68 114/70 122/68    Heart Rate (Admit) 68 bpm 79 bpm 82 bpm 82 bpm    Heart Rate (Exercise) 97 bpm 96 bpm 101 bpm 96 bpm    Heart Rate (Exit) 88 bpm 93 bpm 86 bpm 81 bpm    Oxygen Saturation (Admit) 93 % 93 % 96 % 92 %    Oxygen Saturation (Exercise) 90 % 91 % 93 % 92 %    Oxygen Saturation (Exit) 90 % 91 % 92 % 92 %    Rating of Perceived Exertion (Exercise) 12 13 12 12     Perceived Dyspnea (Exercise) 2 2 2 2     Duration Continue with 45 min of aerobic exercise without signs/symptoms of physical distress. Continue with 45 min of aerobic exercise without signs/symptoms of physical distress. Continue with 45 min of aerobic exercise without  signs/symptoms of physical distress. Continue with 45 min of aerobic exercise without signs/symptoms of physical distress.    Intensity THRR unchanged THRR unchanged THRR unchanged THRR unchanged      Progression   Progression Continue to progress workloads to maintain intensity without signs/symptoms of physical distress. Continue to progress workloads to maintain intensity without signs/symptoms of physical distress. Continue to progress workloads to maintain intensity without signs/symptoms of physical distress. Continue to progress workloads to maintain intensity without signs/symptoms of physical distress.      Resistance Training   Training Prescription Yes Yes Yes Yes    Weight blue bands blue bands blue bands blue bands    Reps 10-15 10-15 10-15 10-15    Time 10 Minutes 10 Minutes 10 Minutes 10 Minutes      Interval Training   Interval Training No No No No      Bike   Level 7 7 7  7.5    Minutes 17 17 17 17       NuStep   Level 7 7 7 7     Minutes 17 17 17 17     METs 2.3 2.2 2.2 2.1      Track   Laps 15 15 14   -    Minutes 17 17 17   -      Exercise Review   Progression  -  -  - Yes       Exercise Comments:     Exercise Comments    Row Name 08/05/16 9480 08/25/16 1626 09/02/16 1647       Exercise Comments Patient's first day of rehab is today Patient has attended 5 sessions. Progressing well. Very open to working hard and increasing intensities. Sometimes needs to be reminded on working at the  appropriate level. Home exercise completed        Exercise Goals and Review:   Exercise Goals Re-Evaluation :     Exercise Goals Re-Evaluation    Row Name 09/22/16 1203 09/22/16 1205 10/21/16 0802         Exercise Goal Re-Evaluation   Exercise Goals Review Increase Physical Activity;Increase Strenth and Stamina  - Increase Physical Activity;Increase Strenth and Stamina     Comments Patient is progressing well in Pulmonary Rehab. Patient is open and motivated to  increasing workload intensities. Will cont. to monitor and progress as appropriate.  - Patient is progressing well in Pulmonary Rehab. Patient is open and motivated to increasing workload intensities. Will cont. to monitor and progress as appropriate.     Expected Outcomes  - Patient will cont. to increase endurance and stamina through the exercises here at rehab. He will also practice his breathing techinques to become less short of breath on exertion.  Patient will cont. to increase endurance and stamina through the exercises here at rehab. He will also practice his breathing techinques to become less short of breath on exertion.         Discharge Exercise Prescription (Final Exercise Prescription Changes):     Exercise Prescription Changes - 10/16/16 1500      Response to Exercise   Blood Pressure (Admit) 148/64   Blood Pressure (Exercise) 150/80   Blood Pressure (Exit) 122/68   Heart Rate (Admit) 82 bpm   Heart Rate (Exercise) 96 bpm   Heart Rate (Exit) 81 bpm   Oxygen Saturation (Admit) 92 %   Oxygen Saturation (Exercise) 92 %   Oxygen Saturation (Exit) 92 %   Rating of Perceived Exertion (Exercise) 12   Perceived Dyspnea (Exercise) 2   Duration Continue with 45 min of aerobic exercise without signs/symptoms of physical distress.   Intensity THRR unchanged     Progression   Progression Continue to progress workloads to maintain intensity without signs/symptoms of physical distress.     Resistance Training   Training Prescription Yes   Weight blue bands   Reps 10-15   Time 10 Minutes     Interval Training   Interval Training No     Bike   Level 7.5   Minutes 17     NuStep   Level 7   Minutes 17   METs 2.1     Exercise Review   Progression Yes      Nutrition:  Target Goals: Understanding of nutrition guidelines, daily intake of sodium <1558m, cholesterol <2010m calories 30% from fat and 7% or less from saturated fats, daily to have 5 or more servings of fruits  and vegetables.  Biometrics:     Pre Biometrics - 07/24/16 1621      Pre Biometrics   Grip Strength 44 kg       Nutrition Therapy Plan and Nutrition Goals:     Nutrition Therapy & Goals - 08/12/16 1022      Nutrition Therapy   Diet Carb Modified, Therapeutic Lifestyle Changes     Personal Nutrition Goals   Nutrition Goal 1-2 lb wt loss/week to a wt loss goal of 6-24 lb [place] individualized specific dietary modifications aiming towards targeted core components such as weight, hypertension, lipid management, diabetes, heart failure and other comorbidities.   Expected Outcomes Short Term Goal: Understand basic principles of dietary content, such as calories,  fat, sodium, cholesterol and nutrients.;Long Term Goal: Adherence to prescribed nutrition plan.      Nutrition Discharge: Rate Your Plate Scores:     Nutrition Assessments - 08/12/16 1014      Rate Your Plate Scores   Pre Score 47      Nutrition Goals Re-Evaluation:   Nutrition Goals Discharge (Final Nutrition Goals Re-Evaluation):   Psychosocial: Target Goals: Acknowledge presence or absence of significant depression and/or stress, maximize coping skills, provide positive support system. Participant is able to verbalize types and ability to use techniques and skills needed for reducing stress and depression.  Initial Review & Psychosocial Screening:     Initial Psych Review & Screening - 07/21/16 [place]? Yes     Barriers   Psychosocial barriers to participate in program There are no identifiable barriers or psychosocial needs.     Screening Interventions   Interventions Encouraged to exercise      Quality of Life Scores:   PHQ-9: Recent Review Flowsheet Data    Depression screen [facility] 2/9 07/21/2016   Decreased Interest 0   Down, Depressed, Hopeless 0    PHQ - 2 Score 0     Interpretation of Total Score  Total Score Depression Severity:  1-4 = Minimal depression, 5-9 = Mild depression, 10-14 = Moderate depression, 15-19 = Moderately severe depression, 20-27 = Severe depression   Psychosocial Evaluation and Intervention:     Psychosocial Evaluation - 07/21/16 1503      Psychosocial Evaluation & Interventions   Interventions Encouraged to exercise with the program and follow exercise prescription      Psychosocial Re-Evaluation:     Psychosocial Re-Evaluation    [place] Name 08/05/16 1631 08/26/16 0911 09/18/16 0855 10/20/16 1424       Psychosocial Re-Evaluation   Current issues with  -  - None Identified None Identified    Comments no psychosocial issues identified no psychosocial issues identified  -  -    Interventions Encouraged to attend Pulmonary Rehabilitation for the exercise Encouraged to attend Pulmonary Rehabilitation for the exercise Encouraged to attend Pulmonary Rehabilitation for the exercise Encouraged to attend Pulmonary Rehabilitation for the exercise    Continue Psychosocial Services  No No No Follow up required No Follow up required       Psychosocial Discharge (Final Psychosocial Re-Evaluation):     Psychosocial Re-Evaluation - 10/20/16 1424      Psychosocial Re-Evaluation   Current issues with None Identified   Interventions Encouraged to attend Pulmonary Rehabilitation for the exercise   Continue Psychosocial Services  No Follow up required      Education: Education Goals: Education classes will be provided on a weekly basis, covering required topics. Participant will state understanding/return demonstration of topics presented.  Learning Barriers/Preferences:   Education Topics: Risk Factor Reduction:  -Group instruction that is supported by a PowerPoint presentation. Instructor discusses the definition of a risk factor, different risk factors for pulmonary disease, and how the heart and lungs  work together.     Nutrition for Pulmonary Patient:  -Group instruction provided by PowerPoint slides, verbal discussion, and written materials to support subject matter. The instructor gives an explanation and review of healthy diet recommendations, which includes a discussion on weight management, recommendations for fruit and vegetable consumption, as well as protein, fluid, caffeine, fiber, sodium, sugar, and alcohol. Tips for eating when patients are short of breath are discussed.   PULMONARY REHAB OTHER RESPIRATORY  from 10/16/2016 in [facility]  Date  09/25/16  Educator  Parke Simmers  Instruction Review Code  2- meets goals/outcomes      Pursed Lip Breathing:  -Group instruction that is supported by demonstration and informational handouts. Instructor discusses the benefits of pursed lip and diaphragmatic breathing and detailed demonstration on how to preform both.     Oxygen Safety:  -Group instruction provided by PowerPoint, verbal discussion, and written material to support subject matter. There is an overview of "What is Oxygen" and "Why do we need it".  Instructor also reviews how to create a safe environment for oxygen use, the importance of using oxygen as prescribed, and the risks of noncompliance. There is a brief discussion on traveling with oxygen and resources the patient may utilize.   PULMONARY REHAB OTHER RESPIRATORY from 10/16/2016 in [place]  Date  10/16/16  Educator  Truddie Crumble  Instruction Review Code  2- meets goals/outcomes      Oxygen Equipment:  -Group instruction provided by [employer] Staff utilizing handouts, written materials, and equipment demonstrations.   Signs and Symptoms:  -Group instruction provided by written material and verbal discussion to support subject matter. Warning signs and symptoms of infection, stroke, and heart attack are reviewed and when to call the physician/911 reinforced. Tips  for preventing the spread of infection discussed.   PULMONARY REHAB OTHER RESPIRATORY from 10/16/2016 in [place]  Date  08/14/16  Educator  rn  Instruction Review Code  2- meets goals/outcomes      Advanced Directives:  -Group instruction provided by verbal instruction and written material to support subject matter. Instructor reviews Advanced Directive laws and proper instruction for filling out document.   Pulmonary Video:  -Group video education that reviews the importance of medication and oxygen compliance, exercise, good nutrition, pulmonary hygiene, and pursed lip and diaphragmatic breathing for the pulmonary patient.   PULMONARY REHAB OTHER RESPIRATORY from 10/16/2016 in [place]  Date  09/04/16  Educator  STAFF  Instruction Review Code  2- meets goals/outcomes      Exercise for the Pulmonary Patient:  -Group instruction that is supported by a PowerPoint presentation. Instructor discusses benefits of exercise, core components of exercise, frequency, duration, and intensity of an exercise routine, importance of utilizing pulse oximetry during exercise, safety while exercising, and options of places to exercise outside of rehab.     PULMONARY REHAB OTHER RESPIRATORY from 10/16/2016 in [place]  Date  08/28/16  Educator  EP  Instruction Review Code  2- meets goals/outcomes      Pulmonary Medications:  -Verbally interactive group education provided by instructor with focus on inhaled medications and proper administration.   PULMONARY REHAB OTHER RESPIRATORY from 10/16/2016 in [place]  Date  08/07/16  Educator  Pharm  Instruction Review Code  2- meets goals/outcomes      Anatomy and Physiology of the Respiratory System and Intimacy:  -Group instruction provided by PowerPoint, verbal discussion, and written material to support subject matter.  Instructor reviews respiratory cycle and anatomical components of the respiratory system and their functions. Instructor also reviews differences in obstructive and restrictive respiratory diseases with examples of each. Intimacy, Sex, and Sexuality differences are reviewed with a discussion on how relationships can change when diagnosed with pulmonary disease. Common sexual concerns are reviewed.   PULMONARY REHAB OTHER RESPIRATORY from 10/16/2016 in [facility]  [place]  Date  09/18/16  Educator  portia  Instruction Review Code  2- meets goals/outcomes      Knowledge Questionnaire Score:   Core Components/Risk Factors/Patient Goals at Admission:     Personal Goals and Risk Factors at Admission - 07/21/16 1457      Core Components/Risk Factors/Patient Goals on Admission    Weight Management Yes;Obesity   Intervention Obesity: Provide education and appropriate resources to help participant work on and attain dietary goals.;Weight Management/Obesity: Establish reasonable short term and long term weight goals.   Improve shortness of breath with ADL's Yes   Intervention Provide education, individualized exercise plan and daily activity instruction to help decrease symptoms of SOB with activities of daily living.   Expected Outcomes Short Term: Achieves a reduction of symptoms when performing activities of daily living.   Develop more efficient breathing techniques such as purse lipped breathing and diaphragmatic breathing; and practicing self-pacing with activity Yes   Intervention Provide education, demonstration and support about specific breathing techniuqes utilized for more efficient breathing. Include techniques such as pursed lipped breathing, diaphragmatic breathing and self-pacing activity.   Expected Outcomes Short Term: Participant will be able to demonstrate and use breathing techniques as needed throughout daily activities.   Increase knowledge of respiratory  medications and ability to use respiratory devices properly  Yes   Intervention Provide education and demonstration as needed of appropriate use of medications, inhalers, and oxygen therapy.   Expected Outcomes Short Term: Achieves understanding of medications use. Understands that oxygen is a medication prescribed by physician. Demonstrates appropriate use of inhaler and oxygen therapy.   Diabetes Yes   Intervention Provide education about signs/symptoms and action to take for hypo/hyperglycemia.;Provide education about proper nutrition, including hydration, and aerobic/resistive exercise prescription along with prescribed medications to achieve blood glucose in normal ranges: Fasting glucose 65-99 mg/dL   Expected Outcomes Short Term: Participant verbalizes understanding of the signs/symptoms and immediate care of hyper/hypoglycemia, proper foot care and importance of medication, aerobic/resistive exercise and nutrition plan for blood glucose control.;Long Term: Attainment of HbA1C < 7%.      Core Components/Risk Factors/Patient Goals Review:      Goals and Risk Factor Review    Row Name 08/05/16 1630 08/26/16 0909 09/18/16 0851 10/20/16 1421       Core Components/Risk Factors/Patient Goals Review   Personal Goals Review Weight Management/Obesity;Increase Strength and Stamina;Improve shortness of breath with ADL's;Diabetes;Develop more efficient breathing techniques such as purse lipped breathing and diaphragmatic breathing and practicing self-pacing with activity.;Increase knowledge of respiratory medications and ability to use respiratory devices properly. Weight Management/Obesity;Increase Strength and Stamina;Diabetes;Develop more efficient breathing techniques such as purse lipped breathing and diaphragmatic breathing and practicing self-pacing with activity.;Increase knowledge of respiratory medications and ability to use respiratory devices properly. Weight Management/Obesity;Develop more  efficient breathing techniques such as purse lipped breathing and diaphragmatic breathing and practicing self-pacing with activity.;Increase knowledge of respiratory medications and ability to use respiratory devices properly. Weight Management/Obesity;Develop more efficient breathing techniques such as purse lipped breathing and diaphragmatic breathing and practicing self-pacing with activity.;Increase knowledge of respiratory medications and ability to use respiratory devices properly.    Review First day of exercise, too early to see progress progressing well, involved, asking questions, seems interested in what our program has to offer, no weight loss No weight loss, maintaining his diabetes well with exercise, workloads increases are tolerated well, attending education classes and asking lots of questions has lost 1 kg, progressing well with  equipment workload increases    Expected Outcomes should see progress in next 30 days continue to increase strength and knowledge base expect great progress, he is committed has 4 sessions left, expect him to continue exercising after graduation from program       Core Components/Risk Factors/Patient Goals at Discharge (Final Review):      Goals and Risk Factor Review - 10/20/16 1421      Core Components/Risk Factors/Patient Goals Review   Personal Goals Review Weight Management/Obesity;Develop more efficient breathing techniques such as purse lipped breathing and diaphragmatic breathing and practicing self-pacing with activity.;Increase knowledge of respiratory medications and ability to use respiratory devices properly.   Review has lost 1 kg, progressing well with equipment workload increases   Expected Outcomes has 4 sessions left, expect him to continue exercising after graduation from program      ITP Comments:   Comments: ITP REVIEW Pt is making expected progress toward pulmonary rehab goals after completing 19 sessions. Recommend continued  exercise, life style modification, education, and utilization of breathing techniques to increase stamina and strength and decrease shortness of breath with exertion.

## 2016-10-23 ENCOUNTER — Encounter (HOSPITAL_COMMUNITY)
Admission: RE | Admit: 2016-10-23 | Discharge: 2016-10-23 | Disposition: A | Discharge status: Home / Self Care | Payer: Non-veteran care | Source: Ambulatory Visit | Attending: Pulmonary Disease | Admitting: Pulmonary Disease

## 2016-10-23 VITALS — Wt 235.0 lb

## 2016-10-23 DIAGNOSIS — J439 Emphysema, unspecified: Secondary | ICD-10-CM

## 2016-10-23 DIAGNOSIS — R0600 Dyspnea, unspecified: Secondary | ICD-10-CM | POA: Diagnosis not present

## 2016-10-23 NOTE — Progress Notes (Signed)
Daily Session Note  Patient Details  Name: Samuel Watkins MRN: [id number] Date of Birth: 07/08/1946 Referring Provider:     Pulmonary Rehab Walk Test from 07/24/2016 in [facility]  Referring Provider  Dr. Clance      Encounter Date: 10/23/2016  Check In:     Session Check In - 10/23/16 1339      Check-In   Location [facility]   Staff Present Molly diVincenzo, MS, ACSM RCEP, Exercise Physiologist;Joan Behrens, RN, BSN;Lisa Hughes, RN;Portia Payne, RN, BSN   Supervising physician immediately available to respond to emergencies [facility] immediately available   Physician(s) Dr. Arrien   Medication changes reported     No   Fall or balance concerns reported    No   Tobacco Cessation No Change   Warm-up and Cool-down Performed as group-led instruction   Resistance Training Performed Yes   VAD Patient? No     Pain Assessment   Currently in Pain? No/denies   Multiple Pain Sites No      Capillary Blood Glucose: No results found for this or any previous visit (from the past 24 hour(s)).     POCT Glucose - 10/23/16 1529      POCT Blood Glucose   Pre-Exercise 175 mg/dL   Post-Exercise 184 mg/dL         Exercise Prescription Changes - 10/23/16 1500      Response to Exercise   Blood Pressure (Admit) 140/70   Blood Pressure (Exercise) 138/78   Blood Pressure (Exit) 116/62   Heart Rate (Admit) 80 bpm   Heart Rate (Exercise) 96 bpm   Heart Rate (Exit) 85 bpm   Oxygen Saturation (Admit) 94 %   Oxygen Saturation (Exercise) 91 %   Oxygen Saturation (Exit) 91 %   Rating of Perceived Exertion (Exercise) 12   Perceived Dyspnea (Exercise) 2   Duration Continue with 45 min of aerobic exercise without signs/symptoms of physical distress.   Intensity THRR unchanged     Progression   Progression Continue to progress workloads to maintain intensity without signs/symptoms of physical distress.     Resistance Training   Training Prescription Yes   Weight blue bands   Reps 10-15   Time 10 Minutes     Interval Training   Interval Training No     NuStep   Level 7   Minutes 17   METs 22     Track   Laps 16   Minutes 17      History  Smoking Status  . Former Smoker  . Packs/day: 1.00  . Years: 50.00  . Types: Cigarettes  . Quit date: 07/14/2010  Smokeless Tobacco  . Never Used    Comment: Counseled to remain smoke free.    Goals Met:  Exercise tolerated well No report of cardiac concerns or symptoms Strength training completed today  Goals Unmet:  Not Applicable  Comments: Service time is from 1330 to 1505    Dr. Wesam G. Yacoub is Medical Director for Pulmonary Rehab at [facility]. 

## 2016-10-24 ENCOUNTER — Encounter: Payer: Self-pay | Admitting: Interventional Cardiology

## 2016-10-28 ENCOUNTER — Encounter (HOSPITAL_COMMUNITY)
Admission: RE | Admit: 2016-10-28 | Discharge: 2016-10-28 | Disposition: A | Discharge status: Home / Self Care | Payer: Non-veteran care | Source: Ambulatory Visit | Attending: Pulmonary Disease | Admitting: Pulmonary Disease

## 2016-10-28 VITALS — Wt 235.5 lb

## 2016-10-28 DIAGNOSIS — J439 Emphysema, unspecified: Secondary | ICD-10-CM

## 2016-10-28 DIAGNOSIS — R0600 Dyspnea, unspecified: Secondary | ICD-10-CM | POA: Diagnosis not present

## 2016-10-28 NOTE — Progress Notes (Signed)
Daily Session Note  Patient Details  Name: Samuel Watkins MRN: [id number] Date of Birth: [birth year] Referring Provider:     Pulmonary Rehab Walk Test from 07/24/2016 in [place]  Referring Provider  Dr. Gwenette Greet      Encounter Date: 10/28/2016  Check In:     Session Check In - 10/28/16 1330      Check-In   Location [facility]   Staff Present Su Hilt, MS, ACSM RCEP, Exercise Physiologist;Joan Leonia Reeves, RN, BSN;Lisa Hughes, RN;Chiquita Heckert Rollene Rotunda, RN, BSN   Supervising physician immediately available to respond to emergencies [facility] immediately available   Physician(s) Dr. Cathlean Sauer   Medication changes reported     No   Fall or balance concerns reported    No   Tobacco Cessation No Change   Warm-up and Cool-down Performed as group-led instruction   Resistance Training Performed Yes   VAD Patient? No     Pain Assessment   Currently in Pain? No/denies   Multiple Pain Sites No      Capillary Blood Glucose: No results found for this or any previous visit (from the past 24 hour(s)).     POCT Glucose - 10/28/16 1508      POCT Blood Glucose   Pre-Exercise 178 mg/dL   Post-Exercise 130 mg/dL         Exercise Prescription Changes - 10/28/16 1506      Response to Exercise   Blood Pressure (Admit) 120/76   Blood Pressure (Exercise) 154/72   Blood Pressure (Exit) 116/70   Heart Rate (Admit) 81 bpm   Heart Rate (Exercise) 98 bpm   Heart Rate (Exit) 84 bpm   Oxygen Saturation (Admit) 94 %   Oxygen Saturation (Exercise) 90 %   Oxygen Saturation (Exit) 93 %   Rating of Perceived Exertion (Exercise) 13   Perceived Dyspnea (Exercise) 2   Duration Continue with 45 min of aerobic exercise without signs/symptoms of physical distress.   Intensity THRR unchanged     Progression   Progression Continue to progress workloads to maintain intensity without signs/symptoms of physical distress.     Resistance Training   Training Prescription Yes   Weight blue bands   Reps 10-15   Time 10 Minutes     Interval Training   Interval Training No     Bike   Level 7.5   Minutes 15     NuStep   Level 8   Minutes 17   METs 2.2     Track   Laps 16   Minutes 17      History  Smoking Status  . Former Smoker  . Packs/day: 1.00  . Years: 50.00  . Types: Cigarettes  . Quit date: 07/14/2010  Smokeless Tobacco  . Never Used    Comment: Counseled to remain smoke free.    Goals Met:  Independence with exercise equipment Improved SOB with ADL's Using PLB without cueing & demonstrates good technique Exercise tolerated well No report of cardiac concerns or symptoms Strength training completed today  Goals Unmet:  Not Applicable  Comments: Service time is from 1330 to 1500   Dr. Rush Farmer is Medical Director for Pulmonary Rehab at [facility].

## 2016-10-30 ENCOUNTER — Encounter (HOSPITAL_COMMUNITY)
Admission: RE | Admit: 2016-10-30 | Discharge: 2016-10-30 | Disposition: A | Discharge status: Home / Self Care | Payer: Non-veteran care | Source: Ambulatory Visit | Attending: Pulmonary Disease | Admitting: Pulmonary Disease

## 2016-10-30 VITALS — Wt 235.7 lb

## 2016-10-30 DIAGNOSIS — R0600 Dyspnea, unspecified: Secondary | ICD-10-CM | POA: Diagnosis not present

## 2016-10-30 DIAGNOSIS — J439 Emphysema, unspecified: Secondary | ICD-10-CM

## 2016-10-30 NOTE — Progress Notes (Signed)
Daily Session Note  Patient Details  Name: Samuel Watkins MRN: [id number] Date of Birth: Jun 28, 1946 Referring Provider:     Pulmonary Rehab Walk Test from 07/24/2016 in [place]  Referring Provider  Dr. Gwenette Greet      Encounter Date: 10/30/2016  Check In:     Session Check In - 10/30/16 1330      Check-In   Location [facility]   Staff Present Su Hilt, MS, ACSM RCEP, Exercise Physiologist;Joan Leonia Reeves, RN, BSN;Lisa Hughes, RN;Portia Rollene Rotunda, RN, BSN   Physician(s) Dr. Candiss Norse   Medication changes reported     No   Fall or balance concerns reported    No   Tobacco Cessation No Change   Warm-up and Cool-down Performed as group-led instruction   Resistance Training Performed Yes   VAD Patient? No     Pain Assessment   Currently in Pain? No/denies   Multiple Pain Sites No      Capillary Blood Glucose: No results found for this or any previous visit (from the past 24 hour(s)).     POCT Glucose - 10/30/16 1644      POCT Blood Glucose   Pre-Exercise 179 mg/dL   Post-Exercise 153 mg/dL         Exercise Prescription Changes - 10/30/16 1600      Response to Exercise   Blood Pressure (Admit) 120/60   Blood Pressure (Exercise) 140/84   Blood Pressure (Exit) 134/76   Heart Rate (Admit) 65 bpm   Heart Rate (Exercise) 99 bpm   Heart Rate (Exit) 86 bpm   Oxygen Saturation (Admit) 95 %   Oxygen Saturation (Exercise) 90 %   Oxygen Saturation (Exit) 92 %   Rating of Perceived Exertion (Exercise) 13   Perceived Dyspnea (Exercise) 2   Duration Continue with 45 min of aerobic exercise without signs/symptoms of physical distress.   Intensity THRR unchanged     Progression   Progression Continue to progress workloads to maintain intensity without signs/symptoms of physical distress.     Resistance Training   Training Prescription Yes   Weight blue bands   Reps 10-15   Time 10 Minutes     Interval Training   Interval  Training No     Bike   Level 8   Minutes 15     Track   Laps 15   Minutes 17     Exercise Review   Progression Yes      History  Smoking Status  . Former Smoker  . Packs/day: 1.00  . Years: 50.00  . Types: Cigarettes  . Quit date: 07/14/2010  Smokeless Tobacco  . Never Used    Comment: Counseled to remain smoke free.    Goals Met:  Exercise tolerated well No report of cardiac concerns or symptoms Strength training completed today  Goals Unmet:  Not Applicable  Comments: Service time is from 1:30P to 3:30P    Dr. Rush Farmer is Medical Director for Pulmonary Rehab at [facility].

## 2016-11-04 ENCOUNTER — Encounter (HOSPITAL_COMMUNITY): Admission: RE | Admit: 2016-11-04 | Payer: Non-veteran care | Source: Ambulatory Visit

## 2016-11-06 ENCOUNTER — Encounter (HOSPITAL_COMMUNITY): Payer: Non-veteran care

## 2016-11-11 ENCOUNTER — Encounter (HOSPITAL_COMMUNITY)
Admission: RE | Admit: 2016-11-11 | Discharge: 2016-11-11 | Disposition: A | Discharge status: Home / Self Care | Payer: Non-veteran care | Source: Ambulatory Visit | Attending: Pulmonary Disease | Admitting: Pulmonary Disease

## 2016-11-11 VITALS — Wt 234.1 lb

## 2016-11-11 DIAGNOSIS — R0600 Dyspnea, unspecified: Secondary | ICD-10-CM | POA: Diagnosis not present

## 2016-11-11 DIAGNOSIS — J439 Emphysema, unspecified: Secondary | ICD-10-CM

## 2016-11-11 NOTE — Progress Notes (Signed)
Daily Session Note  Patient Details  Name: Samuel Watkins MRN: [id number] Date of Birth: 1946-05-27 Referring Provider:     Pulmonary Rehab Walk Test from 07/24/2016 in [place]  Referring Provider  Dr. Gwenette Greet      Encounter Date: 11/11/2016  Check In:     Session Check In - 11/11/16 1520      Check-In   Location [facility]   Staff Present Trish Fountain, RN, Maxcine Ham, RN, BSN;Molly diVincenzo, MS, ACSM RCEP, Exercise Physiologist   Supervising physician immediately available to respond to emergencies [facility] immediately available   Physician(s) Dr. Posey Pronto   Medication changes reported     No   Fall or balance concerns reported    No   Tobacco Cessation No Change   Warm-up and Cool-down Performed as group-led instruction   Resistance Training Performed Yes   VAD Patient? No     Pain Assessment   Currently in Pain? No/denies   Multiple Pain Sites No      Capillary Blood Glucose: No results found for this or any previous visit (from the past 24 hour(s)).     POCT Glucose - 11/11/16 1527      POCT Blood Glucose   Pre-Exercise 141 mg/dL   Post-Exercise 117 mg/dL         Exercise Prescription Changes - 11/11/16 1500      Response to Exercise   Blood Pressure (Admit) 138/80   Blood Pressure (Exercise) 140/74   Blood Pressure (Exit) 120/74   Heart Rate (Admit) 104 bpm   Heart Rate (Exercise) 88 bpm   Heart Rate (Exit) 62 bpm   Oxygen Saturation (Admit) 94 %   Oxygen Saturation (Exercise) 92 %   Oxygen Saturation (Exit) 93 %   Rating of Perceived Exertion (Exercise) 13   Perceived Dyspnea (Exercise) 2   Duration Continue with 45 min of aerobic exercise without signs/symptoms of physical distress.   Intensity THRR unchanged     Progression   Progression Continue to progress workloads to maintain intensity without signs/symptoms of physical distress.     Resistance Training   Training  Prescription Yes   Weight blue bands   Reps 10-15   Time 10 Minutes     Interval Training   Interval Training No     Bike   Level 8   Minutes 17     NuStep   Level 8   Minutes 17   METs 1.7     Track   Laps 16   Minutes 17      History  Smoking Status  . Former Smoker  . Packs/day: 1.00  . Years: 50.00  . Types: Cigarettes  . Quit date: 07/14/2010  Smokeless Tobacco  . Never Used    Comment: Counseled to remain smoke free.    Goals Met:  Exercise tolerated well No report of cardiac concerns or symptoms Strength training completed today  Goals Unmet:  Not Applicable  Comments: Service time is from 1030 to 1500    Dr. Rush Farmer is Medical Director for Pulmonary Rehab at [facility].

## 2016-11-11 NOTE — Progress Notes (Signed)
Cardiology Office Note    Date:  11/12/2016   ID:  Samuel Watkins, DOB [birth year], MRN [id number]  PCP:  Melinda Crutch, MD  Cardiologist: Sinclair Grooms, MD   Chief Complaint  Patient presents with  . Coronary Artery Disease    History of Present Illness:  Samuel Watkins is a 72 y.o. male who presents for coronary artery disease, history of bare metal stent RCA and drug-eluting stent RCA 2001 and 2008 respectively, and LAD DES 2008.He is managed at the [facility] in [place] (Danton Sewer M.D.) and [place] at [facility] Marland KitchenMelinda Crutch M.D.)  Samuel Watkins is doing well. He denies angina. He no longer smokes cigarettes. He has not needed nitroglycerin. States he is quite physically active. His limitation his exertional hip discomfort. No episodes of palpitation, or syncope.   Past Medical History:  Diagnosis Date  . CAD (coronary artery disease)   . Chronic hepatitis B ([place])   . Hyperlipemia   . Hypertension     Past Surgical History:  Procedure Laterality Date  . ABDOMINAL AORTIC ANEURYSM REPAIR    . [place] SURGERY  1997  . LUNG SURGERY     found spot on lung 10 years ago    Current Medications: Outpatient Medications Prior to Visit  Medication Sig Dispense Refill  . albuterol (PROVENTIL HFA;VENTOLIN HFA) 108 (90 BASE) MCG/ACT inhaler Inhale 2 puffs into the lungs 4 (four) times daily as needed for wheezing or shortness of breath.    Marland Kitchen aspirin 325 MG tablet Take 325 mg by mouth daily.    Marland Kitchen atorvastatin (LIPITOR) 80 MG tablet Take 80 mg by mouth daily.      . budesonide-formoterol (SYMBICORT) 160-4.5 MCG/ACT inhaler Inhale 2 puffs into the lungs 2 (two) times daily.    . metFORMIN (GLUCOPHAGE) 500 MG tablet Take 1,000 mg by mouth 2 (two) times daily with a meal.     . pantoprazole (PROTONIX) 40 MG tablet Take 40 mg by mouth daily.    . Tiotropium Bromide-Olodaterol (STIOLTO RESPIMAT) 2.5-2.5 MCG/ACT AERS Inhale 2 puffs into the lungs daily.    Marland Kitchen losartan (COZAAR)  50 MG tablet Take 50 mg by mouth daily.      . niacin (NIASPAN) 1000 MG CR tablet Take 1,000 mg by mouth at bedtime.      . nitroGLYCERIN (NITROSTAT) 0.4 MG SL tablet Place 0.4 mg under the tongue every 5 (five) minutes as needed.       No facility-administered medications prior to visit.      Allergies:   Patient has no known allergies.   Social History   Social History  . Marital status: Married    Spouse name: N/A  . Number of children: 3  . Years of education: N/A   Occupational History  . retired    Social History Main Topics  . Smoking status: Former Smoker    Packs/day: 1.00    Years: 50.00    Types: Cigarettes    Quit date: 07/14/2010  . Smokeless tobacco: Never Used     Comment: Counseled to remain smoke free.  . Alcohol use No  . Drug use: No  . Sexual activity: Not Asked   Other Topics Concern  . None   Social History Narrative  . None     Family History:  The patient's family history includes Heart disease in his father and mother; Throat cancer in his brother.   ROS:   Please see the history of  present illness.    None and denies hemoptysis, weight loss, chronic cough, and wheezing.  All other systems reviewed and are negative.   PHYSICAL EXAM:   VS:  BP (!) 144/80 (BP Location: Right Arm)   Pulse (!) 58   Ht 6\' 1"  (1.854 m)   Wt 234 lb 3.2 oz (106.2 kg)   BMI 30.90 kg/m    GEN: Well nourished, well developed, in no acute distress  HEENT: normal  Neck: no JVD, carotid bruits, or masses Cardiac: RRR; no murmurs, rubs, or gallops,no edema  Respiratory:  clear to auscultation bilaterally, normal work of breathing GI: soft, nontender, nondistended, + BS MS: no deformity or atrophy  Skin: warm and dry, no rash Neuro:  Alert and Oriented x 3, Strength and sensation are intact Psych: euthymic mood, full affect  Wt Readings from Last 3 Encounters:  11/12/16 234 lb 3.2 oz (106.2 kg)  11/11/16 234 lb 2.1 oz (106.2 kg)  10/30/16 235 lb 10.8 oz  (106.9 kg)      Studies/Labs Reviewed:   EKG:  EKG  Sinus bradycardia, right bundle branch block, nonspecific ST elevation, and when compared to the prior tracing from 2016, no change has occurred.  Recent Labs: No results found for requested labs within last 8760 hours.   Lipid Panel    Component Value Date/Time   CHOL  07/10/2007 0345    189        ATP III CLASSIFICATION:  <200     mg/dL   Desirable  200-239  mg/dL   Borderline High  >=240    mg/dL   High   TRIG 105 07/10/2007 0345   HDL 18 (L) 07/10/2007 0345   CHOLHDL 10.5 07/10/2007 0345   VLDL 21 07/10/2007 0345   LDLCALC (H) 07/10/2007 0345    150        Total Cholesterol/HDL:CHD Risk Coronary Heart Disease Risk Table                     Men   Women  1/2 Average Risk   3.4   3.3    Additional studies/ records that were reviewed today include:   No new data. We'll obtain copies of laboratory data from primary care if available. He states he has his most recent [place] laboratory data at home. He will leave the fax or drop a copy of this information all 4 records.   Last cardiac catheterization was 2012 when the right coronary was stented.    ASSESSMENT:    1. CAD in native artery   2. Essential hypertension   3. Other emphysema ([place])   4. Hyperlipidemia, unspecified hyperlipidemia type      PLAN:  In order of problems listed above:  1. Clinically stable without angina or equivalent. Decrease aspirin 81 mg daily. I encouraged aerobic activity. No functional testing is felt indicated at this time with the patient now 6 years post PCI. 2. Blood pressure is borderline controlled with systolic of 161 mmHg. We discussed salt restriction in his diet. Target blood pressure less than 140/90 mmHg. 3. Being managed by the [facility], Dr. Danton Sewer 4. No recent lipid data. He elected for college and [facility] in [place] or following his values, according to the patient. LDL target is less than  70.  Overall clinically well without claudication, neurovascular symptoms, or angina. I encouraged an active lifestyle, we discussed her pressure and LDL targets (less than 140/90 mmHg and less  than 70 mg/dL respectively). Plan clinic follow-up in one year.  Medication Adjustments/Labs and Tests Ordered: Current medicines are reviewed at length with the patient today.  Concerns regarding medicines are outlined above.  Medication changes, Labs and Tests ordered today are listed in the Patient Instructions below. There are no Patient Instructions on file for this visit.   Signed, Sinclair Grooms, MD  11/12/2016 10:35 AM    [facility] [address], [place], [place]  [address] Phone: [phone]; Fax: [phone]

## 2016-11-12 ENCOUNTER — Encounter: Payer: Self-pay | Admitting: Interventional Cardiology

## 2016-11-12 ENCOUNTER — Ambulatory Visit (INDEPENDENT_AMBULATORY_CARE_PROVIDER_SITE_OTHER): Payer: Medicare HMO | Admitting: Interventional Cardiology

## 2016-11-12 VITALS — BP 144/80 | HR 58 | Ht 73.0 in | Wt 234.2 lb

## 2016-11-12 DIAGNOSIS — J438 Other emphysema: Secondary | ICD-10-CM

## 2016-11-12 DIAGNOSIS — I10 Essential (primary) hypertension: Secondary | ICD-10-CM

## 2016-11-12 DIAGNOSIS — E785 Hyperlipidemia, unspecified: Secondary | ICD-10-CM | POA: Diagnosis not present

## 2016-11-12 DIAGNOSIS — I2510 Atherosclerotic heart disease of native coronary artery without angina pectoris: Secondary | ICD-10-CM | POA: Diagnosis not present

## 2016-11-12 MED ORDER — ASPIRIN EC 81 MG PO TBEC: 81.0000 mg | DELAYED_RELEASE_TABLET | Freq: Every day | ORAL | 3 refills | Status: DC

## 2016-11-12 NOTE — Patient Instructions (Addendum)
Medication Instructions:  1) DECREASE Aspirin to 81mg  once daily.  Labwork: None  Testing/Procedures: None  Follow-Up: Your physician wants you to follow-up in: 1 year with Dr. Tamala Julian.  You will receive a reminder letter in the mail two months in advance. If you don't receive a letter, please call our office to schedule the follow-up appointment.   Any Other Special Instructions Will Be Listed Below (If Applicable).  You can fax labs to [phone].   If you need a refill on your cardiac medications before your next appointment, please call your pharmacy.

## 2016-11-13 ENCOUNTER — Encounter (HOSPITAL_COMMUNITY)
Admission: RE | Admit: 2016-11-13 | Discharge: 2016-11-13 | Disposition: A | Discharge status: Home / Self Care | Payer: Non-veteran care | Source: Ambulatory Visit | Attending: Pulmonary Disease | Admitting: Pulmonary Disease

## 2016-11-13 VITALS — Wt 236.8 lb

## 2016-11-13 DIAGNOSIS — J439 Emphysema, unspecified: Secondary | ICD-10-CM

## 2016-11-13 DIAGNOSIS — R0600 Dyspnea, unspecified: Secondary | ICD-10-CM | POA: Diagnosis not present

## 2016-11-13 NOTE — Progress Notes (Signed)
Daily Session Note  Patient Details  Name: GEAN LAROSE MRN: [id number] Date of Birth: [birth year] Referring Provider:     Pulmonary Rehab Walk Test from 07/24/2016 in [place]  Referring Provider  Dr. Gwenette Greet      Encounter Date: 11/13/2016  Check In:     Session Check In - 11/13/16 1402      Check-In   Location [facility]   Staff Present Trish Fountain, RN, Maxcine Ham, RN, Roque Cash, RN   Supervising physician immediately available to respond to emergencies [facility] immediately available   Physician(s) Dr. Sloan Leiter   Medication changes reported     No   Fall or balance concerns reported    No   Tobacco Cessation No Change   Warm-up and Cool-down Performed as group-led instruction   Resistance Training Performed Yes   VAD Patient? No     Pain Assessment   Currently in Pain? No/denies   Multiple Pain Sites No      Capillary Blood Glucose: No results found for this or any previous visit (from the past 24 hour(s)).      Exercise Prescription Changes - 11/13/16 1600      Response to Exercise   Blood Pressure (Admit) 120/80   Blood Pressure (Exercise) 150/80   Blood Pressure (Exit) 120/76   Heart Rate (Admit) 66 bpm   Heart Rate (Exercise) 90 bpm   Heart Rate (Exit) 82 bpm   Oxygen Saturation (Admit) 94 %   Oxygen Saturation (Exercise) 92 %   Oxygen Saturation (Exit) 95 %   Rating of Perceived Exertion (Exercise) 12   Perceived Dyspnea (Exercise) 2   Duration Continue with 45 min of aerobic exercise without signs/symptoms of physical distress.   Intensity THRR unchanged     Progression   Progression Continue to progress workloads to maintain intensity without signs/symptoms of physical distress.     Resistance Training   Training Prescription Yes   Weight blue bands   Reps 10-15   Time 10 Minutes     Interval Training   Interval Training No     Bike   Level 8   Minutes 17     Track    Laps 16   Minutes 17      History  Smoking Status  . Former Smoker  . Packs/day: 1.00  . Years: 50.00  . Types: Cigarettes  . Quit date: 07/14/2010  Smokeless Tobacco  . Never Used    Comment: Counseled to remain smoke free.    Goals Met:  Exercise tolerated well Strength training completed today  Goals Unmet:  Not Applicable  Comments: Service time is from 1330 to 1530    Dr. Rush Farmer is Medical Director for Pulmonary Rehab at [facility].

## 2016-11-18 ENCOUNTER — Encounter (HOSPITAL_COMMUNITY)
Admission: RE | Admit: 2016-11-18 | Discharge: 2016-11-18 | Disposition: A | Discharge status: Home / Self Care | Payer: Non-veteran care | Source: Ambulatory Visit | Attending: Pulmonary Disease | Admitting: Pulmonary Disease

## 2016-11-18 VITALS — Wt 235.7 lb

## 2016-11-18 DIAGNOSIS — R0600 Dyspnea, unspecified: Secondary | ICD-10-CM | POA: Diagnosis not present

## 2016-11-18 DIAGNOSIS — J439 Emphysema, unspecified: Secondary | ICD-10-CM

## 2016-11-18 NOTE — Progress Notes (Signed)
Daily Session Note  Patient Details  Name: Samuel Watkins MRN: [id number] Date of Birth: May 23, 1946 Referring Provider:     Pulmonary Rehab Walk Test from 07/24/2016 in [place]  Referring Provider  Dr. Gwenette Greet      Encounter Date: 11/18/2016  Check In:     Session Check In - 11/18/16 1333      Check-In   Location [facility]   Staff Present Trish Fountain, RN, Maxcine Ham, RN, BSN;Molly diVincenzo, MS, ACSM RCEP, Exercise Physiologist;Lisa Ysidro Evert, RN   Supervising physician immediately available to respond to emergencies [facility] immediately available   Physician(s) Dr. Almetta Lovely   Medication changes reported     No   Fall or balance concerns reported    No   Tobacco Cessation No Change   Warm-up and Cool-down Performed as group-led instruction   Resistance Training Performed Yes   VAD Patient? No     Pain Assessment   Currently in Pain? No/denies   Multiple Pain Sites No      Capillary Blood Glucose: No results found for this or any previous visit (from the past 24 hour(s)).     POCT Glucose - 11/18/16 1612      POCT Blood Glucose   Pre-Exercise 139 mg/dL   Post-Exercise 105 mg/dL         Exercise Prescription Changes - 11/18/16 1610      Response to Exercise   Blood Pressure (Admit) 118/56   Blood Pressure (Exercise) 164/80   Blood Pressure (Exit) 118/76   Heart Rate (Admit) 60 bpm   Heart Rate (Exercise) 85 bpm   Heart Rate (Exit) 62 bpm   Oxygen Saturation (Admit) 93 %   Oxygen Saturation (Exercise) 91 %   Oxygen Saturation (Exit) 92 %   Rating of Perceived Exertion (Exercise) 12   Perceived Dyspnea (Exercise) 2   Duration Continue with 45 min of aerobic exercise without signs/symptoms of physical distress.   Intensity THRR unchanged     Progression   Progression Continue to progress workloads to maintain intensity without signs/symptoms of physical distress.     Resistance Training   Training Prescription Yes   Weight blue bands   Reps 10-15   Time 10 Minutes     Interval Training   Interval Training No     Bike   Level 8   Minutes 17     NuStep   Level 9   Minutes 17   METs 1.9     Track   Laps 16   Minutes 17      History  Smoking Status  . Former Smoker  . Packs/day: 1.00  . Years: 50.00  . Types: Cigarettes  . Quit date: 07/14/2010  Smokeless Tobacco  . Never Used    Comment: Counseled to remain smoke free.    Goals Met:  Independence with exercise equipment Improved SOB with ADL's Using PLB without cueing & demonstrates good technique Exercise tolerated well No report of cardiac concerns or symptoms Strength training completed today  Goals Unmet:  Not Applicable  Comments: Service time is from 1330 to 1515   Dr. Rush Farmer is Medical Director for Pulmonary Rehab at [facility].

## 2016-11-20 ENCOUNTER — Encounter (HOSPITAL_COMMUNITY)
Admission: RE | Admit: 2016-11-20 | Discharge: 2016-11-20 | Disposition: A | Discharge status: Home / Self Care | Payer: Non-veteran care | Source: Ambulatory Visit | Attending: Pulmonary Disease | Admitting: Pulmonary Disease

## 2016-11-20 VITALS — Wt 236.1 lb

## 2016-11-20 DIAGNOSIS — R0600 Dyspnea, unspecified: Secondary | ICD-10-CM | POA: Diagnosis not present

## 2016-11-20 DIAGNOSIS — J439 Emphysema, unspecified: Secondary | ICD-10-CM

## 2016-11-20 NOTE — Progress Notes (Signed)
Daily Session Note  Patient Details  Name: KASPAR ALBORNOZ MRN: [id number] Date of Birth: September 01, 1945 Referring Provider:     Pulmonary Rehab Walk Test from 07/24/2016 in [place]  Referring Provider  Dr. Gwenette Greet      Encounter Date: 11/20/2016  Check In:     Session Check In - 11/20/16 1330      Check-In   [facility]   Staff Present Rosebud Poles, RN, BSN;Lisa Ysidro Evert, RN;Lailie Smead Rollene Rotunda, RN, BSN   Supervising physician immediately available to respond to emergencies [facility] immediately available   Physician(s) Dr. Wendee Beavers   Medication changes reported     No   Fall or balance concerns reported    No   Tobacco Cessation No Change   Warm-up and Cool-down Performed as group-led instruction   Resistance Training Performed Yes   VAD Patient? No     Pain Assessment   Currently in Pain? No/denies   Multiple Pain Sites No      Capillary Blood Glucose: No results found for this or any previous visit (from the past 24 hour(s)).      Exercise Prescription Changes - 11/20/16 1601      Response to Exercise   Blood Pressure (Admit) 136/60   Blood Pressure (Exercise) 172/82   Blood Pressure (Exit) 100/62   Heart Rate (Admit) 60 bpm   Heart Rate (Exercise) 81 bpm   Heart Rate (Exit) 63 bpm   Oxygen Saturation (Admit) 91 %   Oxygen Saturation (Exercise) 92 %   Oxygen Saturation (Exit) 93 %   Rating of Perceived Exertion (Exercise) 12   Perceived Dyspnea (Exercise) 2   Duration Continue with 45 min of aerobic exercise without signs/symptoms of physical distress.   Intensity THRR unchanged     Progression   Progression Continue to progress workloads to maintain intensity without signs/symptoms of physical distress.     Resistance Training   Training Prescription Yes   Weight blue bands   Reps 10-15   Time 10 Minutes     Interval Training   Interval Training No     Bike   Level 8   Minutes 17     NuStep    Level 9   Minutes 17   METs 2      History  Smoking Status  . Former Smoker  . Packs/day: 1.00  . Years: 50.00  . Types: Cigarettes  . Quit date: 07/14/2010  Smokeless Tobacco  . Never Used    Comment: Counseled to remain smoke free.    Goals Met:  Independence with exercise equipment Improved SOB with ADL's Using PLB without cueing & demonstrates good technique Exercise tolerated well No report of cardiac concerns or symptoms Strength training completed today  Goals Unmet:  Not Applicable  Comments: Service time is from 1330 to 1530   Dr. Rush Farmer is Medical Director for Pulmonary Rehab at [facility].

## 2016-11-25 ENCOUNTER — Encounter (HOSPITAL_COMMUNITY): Payer: Non-veteran care

## 2016-11-25 DIAGNOSIS — R609 Edema, unspecified: Secondary | ICD-10-CM | POA: Diagnosis not present

## 2016-11-27 ENCOUNTER — Encounter (HOSPITAL_COMMUNITY): Payer: Non-veteran care

## 2016-11-27 ENCOUNTER — Telehealth (HOSPITAL_COMMUNITY): Payer: Self-pay | Admitting: *Deleted

## 2016-12-02 ENCOUNTER — Encounter (HOSPITAL_COMMUNITY)
Admission: RE | Admit: 2016-12-02 | Discharge: 2016-12-02 | Disposition: A | Discharge status: Home / Self Care | Payer: Non-veteran care | Source: Ambulatory Visit | Attending: Pulmonary Disease | Admitting: Pulmonary Disease

## 2016-12-02 DIAGNOSIS — R0600 Dyspnea, unspecified: Secondary | ICD-10-CM | POA: Diagnosis not present

## 2016-12-02 DIAGNOSIS — J439 Emphysema, unspecified: Secondary | ICD-10-CM

## 2016-12-02 NOTE — Progress Notes (Signed)
Daily Session Note  Patient Details  Name: Samuel Watkins MRN: [id number] Date of Birth: Nov 09, 1945 Referring Provider:     Pulmonary Rehab Walk Test from 07/24/2016 in [place]  Referring Provider  Dr. Gwenette Greet      Encounter Date: 12/02/2016  Check In:     Session Check In - 12/02/16 1519      Check-In   Location [facility]   Staff Present Trish Fountain, RN, Maxcine Ham, RN, BSN;Daneli Butkiewicz, RN;Molly diVincenzo, MS, ACSM RCEP, Exercise Physiologist   Supervising physician immediately available to respond to emergencies [facility] immediately available   Physician(s) Dr. Broadus John   Medication changes reported     No   Fall or balance concerns reported    No   Tobacco Cessation No Change   Warm-up and Cool-down Performed as group-led instruction   Resistance Training Performed Yes   VAD Patient? No     Pain Assessment   Currently in Pain? No/denies   Multiple Pain Sites No      Capillary Blood Glucose: No results found for this or any previous visit (from the past 24 hour(s)).     POCT Glucose - 12/02/16 1523      POCT Blood Glucose   Pre-Exercise 163 mg/dL   Post-Exercise 169 mg/dL         Exercise Prescription Changes - 12/02/16 1500      Response to Exercise   Blood Pressure (Admit) 136/70   Blood Pressure (Exercise) 148/80   Blood Pressure (Exit) 110/50   Heart Rate (Admit) 69 bpm   Heart Rate (Exercise) 93 bpm   Heart Rate (Exit) 76 bpm   Oxygen Saturation (Admit) 93 %   Oxygen Saturation (Exercise) 89 %   Oxygen Saturation (Exit) 90 %   Rating of Perceived Exertion (Exercise) 13   Perceived Dyspnea (Exercise) 2   Duration Continue with 45 min of aerobic exercise without signs/symptoms of physical distress.   Intensity THRR unchanged     Progression   Progression Continue to progress workloads to maintain intensity without signs/symptoms of physical distress.     Resistance Training   Training Prescription Yes   Weight blue bands   Reps 10-15   Time 10 Minutes     Interval Training   Interval Training No     Bike   Level 8   Minutes 17     NuStep   Level 9   Minutes 17   METs 2.1     Track   Laps 14   Minutes 17      History  Smoking Status  . Former Smoker  . Packs/day: 1.00  . Years: 50.00  . Types: Cigarettes  . Quit date: 07/14/2010  Smokeless Tobacco  . Never Used    Comment: Counseled to remain smoke free.    Goals Met:  Exercise tolerated well No report of cardiac concerns or symptoms Strength training completed today  Goals Unmet:  Not Applicable  Comments: Service time is from 1330 to 1500    Dr. Rush Farmer is Medical Director for Pulmonary Rehab at [facility].

## 2016-12-04 ENCOUNTER — Encounter (HOSPITAL_COMMUNITY)
Admission: RE | Admit: 2016-12-04 | Discharge: 2016-12-04 | Disposition: A | Discharge status: Home / Self Care | Payer: Non-veteran care | Source: Ambulatory Visit | Attending: Pulmonary Disease | Admitting: Pulmonary Disease

## 2016-12-04 DIAGNOSIS — R0600 Dyspnea, unspecified: Secondary | ICD-10-CM | POA: Diagnosis not present

## 2016-12-04 NOTE — Progress Notes (Signed)
Daily Session Note  Patient Details  Name: Samuel Watkins MRN: [id number] Date of Birth: May 02, 1946 Referring Provider:     Pulmonary Rehab Walk Test from 07/24/2016 in [place]  Referring Provider  Dr. Gwenette Greet      Encounter Date: 12/04/2016  Check In:     Session Check In - 12/04/16 1330      Check-In   Location [facility]   Staff Present Rosebud Poles, RN, BSN;Ramon Dredge, RN, MHA;Telena Peyser Rollene Rotunda, RN, BSN   Supervising physician immediately available to respond to emergencies [facility] immediately available   Physician(s) Dr. Karleen Hampshire   Medication changes reported     No   Fall or balance concerns reported    No   Tobacco Cessation No Change   Warm-up and Cool-down Performed as group-led instruction   Resistance Training Performed Yes   VAD Patient? No     Pain Assessment   Currently in Pain? No/denies   Multiple Pain Sites No      Capillary Blood Glucose: No results found for this or any previous visit (from the past 24 hour(s)).     POCT Glucose - 12/04/16 1615      POCT Blood Glucose   Pre-Exercise 98 mg/dL   Post-Exercise 121 mg/dL         Exercise Prescription Changes - 12/04/16 1616      Response to Exercise   Blood Pressure (Admit) 122/62   Blood Pressure (Exercise) 140/70   Blood Pressure (Exit) 102/68   Heart Rate (Admit) 63 bpm   Heart Rate (Exercise) 92 bpm   Heart Rate (Exit) 60 bpm   Oxygen Saturation (Admit) 94 %   Oxygen Saturation (Exercise) 92 %   Oxygen Saturation (Exit) 91 %   Rating of Perceived Exertion (Exercise) 13   Perceived Dyspnea (Exercise) 2   Duration Continue with 45 min of aerobic exercise without signs/symptoms of physical distress.   Intensity THRR unchanged     Progression   Progression Continue to progress workloads to maintain intensity without signs/symptoms of physical distress.     Resistance Training   Training Prescription Yes   Weight blue bands    Reps 10-15   Time 10 Minutes     Interval Training   Interval Training No     Bike   Level 8   Minutes 17     Track   Laps 14   Minutes 17      History  Smoking Status  . Former Smoker  . Packs/day: 1.00  . Years: 50.00  . Types: Cigarettes  . Quit date: 07/14/2010  Smokeless Tobacco  . Never Used    Comment: Counseled to remain smoke free.    Goals Met:  Independence with exercise equipment Improved SOB with ADL's Using PLB without cueing & demonstrates good technique Personal goals reviewed No report of cardiac concerns or symptoms Strength training completed today  Goals Unmet:  Not Applicable  Comments: Service time is from 1330 to 1530   Dr. Rush Farmer is Medical Director for Pulmonary Rehab at [facility].

## 2016-12-09 ENCOUNTER — Encounter (HOSPITAL_COMMUNITY): Admission: RE | Admit: 2016-12-09 | Payer: Non-veteran care | Source: Ambulatory Visit

## 2016-12-11 ENCOUNTER — Encounter (HOSPITAL_COMMUNITY)
Admission: RE | Admit: 2016-12-11 | Discharge: 2016-12-11 | Disposition: A | Discharge status: Home / Self Care | Payer: Non-veteran care | Source: Ambulatory Visit | Attending: Pulmonary Disease | Admitting: Pulmonary Disease

## 2016-12-11 VITALS — Wt 235.2 lb

## 2016-12-11 DIAGNOSIS — R0600 Dyspnea, unspecified: Secondary | ICD-10-CM | POA: Diagnosis not present

## 2016-12-11 DIAGNOSIS — J439 Emphysema, unspecified: Secondary | ICD-10-CM

## 2016-12-11 NOTE — Progress Notes (Signed)
Daily Session Note  Patient Details  Name: Samuel Watkins MRN: [id number] Date of Birth: October 06, 1945 Referring Provider:     Pulmonary Rehab Walk Test from 07/24/2016 in [place]  Referring Provider  Dr. Gwenette Greet      Encounter Date: 12/11/2016  Check In:     Session Check In - 12/11/16 1350      Check-In   Location [facility]   Staff Present Trish Fountain, RN, BSN;Jaley Yan, MS, ACSM RCEP, Exercise Physiologist   Supervising physician immediately available to respond to emergencies [facility] immediately available   Physician(s) Dr. Broadus John   Medication changes reported     No   Fall or balance concerns reported    No   Tobacco Cessation No Change   Warm-up and Cool-down Performed as group-led instruction   Resistance Training Performed Yes   VAD Patient? No     Pain Assessment   Currently in Pain? No/denies   Multiple Pain Sites No      Capillary Blood Glucose: No results found for this or any previous visit (from the past 24 hour(s)).      Exercise Prescription Changes - 12/11/16 1600      Response to Exercise   Blood Pressure (Admit) 124/64   Blood Pressure (Exercise) 124/74   Blood Pressure (Exit) 120/66   Heart Rate (Admit) 86 bpm   Heart Rate (Exercise) 97 bpm   Heart Rate (Exit) 91 bpm   Oxygen Saturation (Admit) 93 %   Oxygen Saturation (Exercise) 92 %   Oxygen Saturation (Exit) 94 %   Rating of Perceived Exertion (Exercise) 13   Perceived Dyspnea (Exercise) 2   Duration Continue with 45 min of aerobic exercise without signs/symptoms of physical distress.   Intensity THRR unchanged     Progression   Progression Continue to progress workloads to maintain intensity without signs/symptoms of physical distress.     Resistance Training   Training Prescription Yes   Weight blue bands   Reps 10-15   Time 10 Minutes     Interval Training   Interval Training No     NuStep   Level 9   Minutes 17   METs 2.2     Track   Laps 15   Minutes 17      History  Smoking Status  . Former Smoker  . Packs/day: 1.00  . Years: 50.00  . Types: Cigarettes  . Quit date: 07/14/2010  Smokeless Tobacco  . Never Used    Comment: Counseled to remain smoke free.    Goals Met:  Exercise tolerated well No report of cardiac concerns or symptoms Strength training completed today  Goals Unmet:  Not Applicable  Comments: Service time is from 1:30p to 3:10p    Dr. Rush Farmer is Medical Director for Pulmonary Rehab at [facility].

## 2016-12-16 ENCOUNTER — Encounter (HOSPITAL_COMMUNITY)
Admission: RE | Admit: 2016-12-16 | Discharge: 2016-12-16 | Disposition: A | Discharge status: Home / Self Care | Payer: Medicare HMO | Source: Ambulatory Visit | Attending: Pulmonary Disease | Admitting: Pulmonary Disease

## 2016-12-16 DIAGNOSIS — R0600 Dyspnea, unspecified: Secondary | ICD-10-CM | POA: Diagnosis not present

## 2016-12-16 DIAGNOSIS — J439 Emphysema, unspecified: Secondary | ICD-10-CM

## 2016-12-16 NOTE — Progress Notes (Signed)
Daily Session Note  Patient Details  Name: Samuel Watkins MRN: [id number] Date of Birth: [birth year] Referring Provider:     Pulmonary Rehab Walk Test from 07/24/2016 in [place]  Referring Provider  Dr. Gwenette Greet      Encounter Date: 12/16/2016  Check In:     Session Check In - 12/16/16 1555      Check-In   Location [facility]   Staff Present Su Hilt, MS, ACSM RCEP, Exercise Physiologist;Lovinia Snare Ysidro Evert, RN   Supervising physician immediately available to respond to emergencies [facility] immediately available   Physician(s) Dr. Broadus John   Medication changes reported     No   Fall or balance concerns reported    No   Tobacco Cessation No Change   Warm-up and Cool-down Performed as group-led instruction   Resistance Training Performed Yes   VAD Patient? No     Pain Assessment   Currently in Pain? No/denies   Multiple Pain Sites No      Capillary Blood Glucose: No results found for this or any previous visit (from the past 24 hour(s)).     POCT Glucose - 12/16/16 1605      POCT Blood Glucose   Pre-Exercise 142 mg/dL   Post-Exercise 121 mg/dL         Exercise Prescription Changes - 12/16/16 1600      Response to Exercise   Blood Pressure (Admit) 150/74   Blood Pressure (Exercise) 158/80   Blood Pressure (Exit) 110/62   Heart Rate (Admit) 85 bpm   Heart Rate (Exercise) 100 bpm   Heart Rate (Exit) 93 bpm   Oxygen Saturation (Admit) 96 %   Oxygen Saturation (Exercise) 92 %   Oxygen Saturation (Exit) 91 %   Rating of Perceived Exertion (Exercise) 13   Perceived Dyspnea (Exercise) 2   Duration Continue with 45 min of aerobic exercise without signs/symptoms of physical distress.   Intensity THRR unchanged     Progression   Progression Continue to progress workloads to maintain intensity without signs/symptoms of physical distress.     Resistance Training   Training Prescription Yes   Weight blue bands    Reps 10-15   Time 10 Minutes     Interval Training   Interval Training No     Bike   Level 8   Minutes 17     NuStep   Level 9   Minutes 17   METs 2.2     Track   Laps 14   Minutes 17      History  Smoking Status  . Former Smoker  . Packs/day: 1.00  . Years: 50.00  . Types: Cigarettes  . Quit date: 07/14/2010  Smokeless Tobacco  . Never Used    Comment: Counseled to remain smoke free.    Goals Met:  Exercise tolerated well No report of cardiac concerns or symptoms Strength training completed today  Goals Unmet:  Not Applicable  Comments: Service time is from 1330 to 1500    Dr. Rush Farmer is Medical Director for Pulmonary Rehab at [facility].

## 2016-12-18 ENCOUNTER — Encounter (HOSPITAL_COMMUNITY)
Admission: RE | Admit: 2016-12-18 | Discharge: 2016-12-18 | Disposition: A | Discharge status: Home / Self Care | Payer: Medicare HMO | Source: Ambulatory Visit | Attending: Pulmonary Disease | Admitting: Pulmonary Disease

## 2016-12-18 DIAGNOSIS — J439 Emphysema, unspecified: Secondary | ICD-10-CM

## 2016-12-18 DIAGNOSIS — R0600 Dyspnea, unspecified: Secondary | ICD-10-CM | POA: Diagnosis not present

## 2016-12-18 NOTE — Progress Notes (Signed)
Pulmonary Individual Treatment Plan  Patient Details  Name: Samuel Watkins MRN: [id number] Date of Birth: 10-30-1945 Referring Provider:     Pulmonary Rehab Walk Test from 07/24/2016 in [place]  Referring Provider  Dr. Gwenette Greet      Initial Encounter Date:    Pulmonary Rehab Walk Test from 07/24/2016 in [facility]  Date  07/24/16  Referring Provider  Dr. Gwenette Greet      Visit Diagnosis: Pulmonary emphysema, unspecified emphysema type ([place])  Patient's Home Medications on Admission:   Current Outpatient Prescriptions:  .  albuterol (PROVENTIL HFA;VENTOLIN HFA) 108 (90 BASE) MCG/ACT inhaler, Inhale 2 puffs into the lungs 4 (four) times daily as needed for wheezing or shortness of breath., Disp: , Rfl:  .  aspirin EC 81 MG tablet, Take 1 tablet (81 mg total) by mouth daily., Disp: 90 tablet, Rfl: 3 .  atorvastatin (LIPITOR) 80 MG tablet, Take 80 mg by mouth daily.  , Disp: , Rfl:  .  budesonide-formoterol (SYMBICORT) 160-4.5 MCG/ACT inhaler, Inhale 2 puffs into the lungs 2 (two) times daily., Disp: , Rfl:  .  Cholecalciferol (VITAMIN D3) 5000 units CAPS, Take 5,000 Units by mouth daily., Disp: , Rfl:  .  glimepiride (AMARYL) 2 MG tablet, Take 2 mg by mouth daily with breakfast., Disp: , Rfl:  .  losartan (COZAAR) 100 MG tablet, Take 50 mg by mouth daily., Disp: , Rfl:  .  Magnesium Oxide 420 MG TABS, Take 2 tablets by mouth daily. EXCEPT Sundays, Disp: , Rfl:  .  metFORMIN (GLUCOPHAGE) 500 MG tablet, Take 1,000 mg by mouth 2 (two) times daily with a meal. , Disp: , Rfl:  .  niacin 500 MG tablet, Take 1,000 mg by mouth at bedtime., Disp: , Rfl:  .  nitroGLYCERIN (NITROSTAT) 0.4 MG SL tablet, Place 0.4 mg under the tongue every 5 (five) minutes x 3 doses as needed for chest pain (Call 911 at 3rd dose within 15 minutes.)., Disp: , Rfl:  .  pantoprazole (PROTONIX) 40 MG tablet, Take 40 mg by mouth daily., Disp: , Rfl:  .  Tiotropium  Bromide-Olodaterol (STIOLTO RESPIMAT) 2.5-2.5 MCG/ACT AERS, Inhale 2 puffs into the lungs daily., Disp: , Rfl:   Past Medical History: Past Medical History:  Diagnosis Date  . CAD (coronary artery disease)   . Chronic hepatitis B ([place])   . Hyperlipemia   . Hypertension     Tobacco Use: History  Smoking Status  . Former Smoker  . Packs/day: 1.00  . Years: 50.00  . Types: Cigarettes  . Quit date: 07/14/2010  Smokeless Tobacco  . Never Used    Comment: Counseled to remain smoke free.    Labs: Recent Review Flowsheet Data    Labs for ITP Cardiac and Pulmonary Rehab 07/09/2007 07/10/2007 07/17/2007   Cholestrol - 189 ATP III CLASSIFICATION: <200     mg/dL   Desirable 200-239  mg/dL   Borderline High >=240    mg/dL   High -   LDLCALC - 150 Total Cholesterol/HDL:CHD Risk Coronary Heart Disease Risk Table Men   Women 1/2 Average Risk   3.4   3.3(H) -   HDL - 18(L) -   Trlycerides - 105 -   HCO3 22.7 - 27.8(H)   TCO2 24 - 29      Capillary Blood Glucose: No results found for: GLUCAP     POCT Glucose    Row Name 08/07/16 1610 08/14/16 1558 08/19/16 1519 08/26/16  1506 09/02/16 1545     POCT Blood Glucose   Pre-Exercise 163 mg/dL 147 mg/dL 148 mg/dL 114 mg/dL 190 mg/dL   Post-Exercise 170 mg/dL 137 mg/dL 142 mg/dL 114 mg/dL 155 mg/dL   Row Name 09/04/16 1510 09/16/16 1538 09/23/16 1603 09/25/16 1550 10/07/16 1527     POCT Blood Glucose   Pre-Exercise 194 mg/dL 121 mg/dL 93 mg/dL  Given bananna and gingerale96 96 mg/dL  pt given bananna abd gingerale before exercise 146 mg/dL   Post-Exercise 198 mg/dL 114 mg/dL 96 mg/dL  pt drank gingerale 121 mg/dL 160 mg/dL   Row Name 10/14/16 1549 10/16/16 1554 10/23/16 1529 10/28/16 1508 10/30/16 1644     POCT Blood Glucose   Pre-Exercise 179 mg/dL 196 mg/dL 175 mg/dL 178 mg/dL 179 mg/dL   Post-Exercise 144 mg/dL 157 mg/dL 184 mg/dL 130 mg/dL 153 mg/dL   Row Name 11/11/16 1527 11/18/16 1612 11/20/16 1610 12/02/16 1523 12/04/16  1615     POCT Blood Glucose   Pre-Exercise 141 mg/dL 139 mg/dL 146 mg/dL 163 mg/dL 98 mg/dL   Post-Exercise 117 mg/dL 105 mg/dL 145 mg/dL 169 mg/dL 121 mg/dL   Row Name 12/16/16 1605             POCT Blood Glucose   Pre-Exercise 142 mg/dL       Post-Exercise 121 mg/dL          ADL UCSD:   Pulmonary Function Assessment:     Pulmonary Function Assessment - 07/21/16 1448      Breath   Bilateral Breath Sounds Clear   Shortness of Breath Limiting activity      Exercise Target Goals:    Exercise Program Goal: Individual exercise prescription set with THRR, safety & activity barriers. Participant demonstrates ability to understand and report RPE using BORG scale, to self-measure pulse accurately, and to acknowledge the importance of the exercise prescription.  Exercise Prescription Goal: Starting with aerobic activity 30 plus minutes a day, 3 days per week for initial exercise prescription. Provide home exercise prescription and guidelines that participant acknowledges understanding prior to discharge.  Activity Barriers & Risk Stratification:     Activity Barriers & Cardiac Risk Stratification - 07/21/16 1448      Activity Barriers & Cardiac Risk Stratification   Activity Barriers Shortness of Breath;Back Problems      6 Minute Walk:     6 Minute Walk    Row Name 07/24/16 1621         6 Minute Walk   Phase Initial     Distance 1300 feet     Walk Time 6 minutes     # of Rest Breaks 0     MPH 2.46     METS 2.84     RPE 12     Perceived Dyspnea  1     Symptoms Yes (comment)     Comments hip discomfort and calf tightness     Resting HR 90 bpm     Resting BP 120/80     Max Ex. HR 105 bpm     Max Ex. BP 158/80     2 Minute Post BP 130/80       Interval HR   Baseline HR 90     1 Minute HR 99     2 Minute HR 97     3 Minute HR 102     4 Minute HR 99     5 Minute HR 102     6 Minute HR  105     2 Minute Post HR 95     Interval Heart Rate? Yes        Interval Oxygen   Interval Oxygen? Yes     Baseline Oxygen Saturation % 94 %     Baseline Liters of Oxygen 0 L     1 Minute Oxygen Saturation % 92 %     1 Minute Liters of Oxygen 0 L     2 Minute Oxygen Saturation % 90 %     2 Minute Liters of Oxygen 0 L     3 Minute Oxygen Saturation % 91 %     3 Minute Liters of Oxygen 0 L     4 Minute Oxygen Saturation % 91 %     4 Minute Liters of Oxygen 0 L     5 Minute Oxygen Saturation % 90 %     5 Minute Liters of Oxygen 0 L     6 Minute Oxygen Saturation % 90 %     6 Minute Liters of Oxygen 0 L     2 Minute Post Oxygen Saturation % 95 %     2 Minute Post Liters of Oxygen 0 L        Oxygen Initial Assessment:   Oxygen Re-Evaluation:     Oxygen Re-Evaluation    Row Name 10/20/16 1420 11/18/16 1617 12/18/16 0931         Program Oxygen Prescription   Program Oxygen Prescription None None None       Home Oxygen   Home Oxygen Device None None None     Sleep Oxygen Prescription None None None     Home Exercise Oxygen Prescription None None None     Home at Rest Exercise Oxygen Prescription None None None     Compliance with Home Oxygen Use -  not applicable  -  -        Oxygen Discharge (Final Oxygen Re-Evaluation):     Oxygen Re-Evaluation - 12/18/16 0931      Program Oxygen Prescription   Program Oxygen Prescription None     Home Oxygen   Home Oxygen Device None   Sleep Oxygen Prescription None   Home Exercise Oxygen Prescription None   Home at Rest Exercise Oxygen Prescription None      Initial Exercise Prescription:     Initial Exercise Prescription - 07/24/16 1600      Date of Initial Exercise RX and Referring Provider   Date 07/24/16   Referring Provider Dr. Gwenette Greet     Bike   Level 2  sci fit   Minutes 17     NuStep   Level 2   Minutes 17   METs 1.5     Track   Laps 10   Minutes 17     Prescription Details   Frequency (times per week) 2   Duration Progress to 45 minutes of aerobic  exercise without signs/symptoms of physical distress     Intensity   THRR 40-80% of Max Heartrate 61-122   Ratings of Perceived Exertion 11-13   Perceived Dyspnea 0-4     Progression   Progression Continue progressive overload as per policy without signs/symptoms or physical distress.     Resistance Training   Training Prescription Yes   Weight blue bands      Perform Capillary Blood Glucose checks as needed.  Exercise Prescription Changes:     Exercise Prescription Changes    Row Name 08/05/16 1600  08/07/16 1607 08/14/16 1500 08/19/16 1500 08/21/16 1500     Response to Exercise   Blood Pressure (Admit) 120/66 126/72 122/64 112/60 140/70   Blood Pressure (Exercise) 144/70 120/80 130/70 152/76 150/80   Blood Pressure (Exit) 130/74 120/68 140/80 114/60 108/78   Heart Rate (Admit) 96 bpm 99 bpm 89 bpm 94 bpm 83 bpm   Heart Rate (Exercise) 109 bpm 105 bpm 103 bpm 133 bpm 93 bpm   Heart Rate (Exit) 104 bpm 97 bpm 95 bpm 99 bpm 96 bpm   Oxygen Saturation (Admit) 91 % 89 % 91 % 93 % 94 %   Oxygen Saturation (Exercise) 90 % 92 % 88 % 90 % 93 %   Oxygen Saturation (Exit) 90 % 94 % 90 % 92 % 93 %   Rating of Perceived Exertion (Exercise) 13 11 13 13 11    Perceived Dyspnea (Exercise) 1 1 1 1  0   Duration Progress to 45 minutes of aerobic exercise without signs/symptoms of physical distress Progress to 45 minutes of aerobic exercise without signs/symptoms of physical distress Progress to 45 minutes of aerobic exercise without signs/symptoms of physical distress Progress to 45 minutes of aerobic exercise without signs/symptoms of physical distress Progress to 45 minutes of aerobic exercise without signs/symptoms of physical distress   Intensity -  40-80% HRR -  40-80% HRR THRR unchanged THRR unchanged THRR unchanged     Progression   Progression  -  - Continue to progress workloads to maintain intensity without signs/symptoms of physical distress. Continue to progress workloads to  maintain intensity without signs/symptoms of physical distress. Continue to progress workloads to maintain intensity without signs/symptoms of physical distress.     Resistance Training   Training Prescription Yes Yes Yes Yes Yes   Weight blue bands blue bands blue bands blue bands blue bands   Reps 10-12  10 minutes of strength training 10-12  10 minutes of strength training 10-12  10 minutes of strength training 10-12  10 minutes of strength training 10-12  10 minutes of strength training     Interval Training   Interval Training No No No No No     Bike   Level 2 4  - 5 5   Minutes 17 17  - 17 17     NuStep   Level 3 4 4 5 5    Minutes 17 17 17 17 17    METs  - 1.6 2.1 2.4 1.7     Track   Laps 14  - 16 10  -   Minutes 17  - 17 17  -     Exercise Review   Progression Yes Yes Yes Yes  -   Row Name 08/26/16 1500 08/28/16 1500 09/02/16 1500 09/04/16 1500 09/09/16 1504     Response to Exercise   Blood Pressure (Admit) 102/72 98/52 140/90 124/68 110/64   Blood Pressure (Exercise) 128/70 140/70 142/80 140/80 142/76   Blood Pressure (Exit) 112/60 110/60 134/74 150/64 128/74   Heart Rate (Admit) 89 bpm 85 bpm 62 bpm 66 bpm 84 bpm   Heart Rate (Exercise) 96 bpm 100 bpm 87 bpm 97 bpm 99 bpm   Heart Rate (Exit) 98 bpm 98 bpm 55 bpm 93 bpm 98 bpm   Oxygen Saturation (Admit) 95 % 92 % 95 % 94 % 93 %   Oxygen Saturation (Exercise) 91 % 90 % 92 % 93 % 90 %   Oxygen Saturation (Exit) 93 % 94 % 94 % 90 % 93 %  Rating of Perceived Exertion (Exercise) 12 12 12 12 12    Perceived Dyspnea (Exercise) 2 1 1 2 2    Duration Progress to 45 minutes of aerobic exercise without signs/symptoms of physical distress Progress to 45 minutes of aerobic exercise without signs/symptoms of physical distress Progress to 45 minutes of aerobic exercise without signs/symptoms of physical distress Progress to 45 minutes of aerobic exercise without signs/symptoms of physical distress Progress to 45 minutes of aerobic  exercise without signs/symptoms of physical distress   Intensity THRR unchanged THRR unchanged THRR unchanged THRR unchanged THRR unchanged     Progression   Progression Continue to progress workloads to maintain intensity without signs/symptoms of physical distress. Continue to progress workloads to maintain intensity without signs/symptoms of physical distress. Continue to progress workloads to maintain intensity without signs/symptoms of physical distress. Continue to progress workloads to maintain intensity without signs/symptoms of physical distress. Continue to progress workloads to maintain intensity without signs/symptoms of physical distress.     Resistance Training   Training Prescription Yes Yes Yes Yes Yes   Weight blue bands blue bands blue bands blue bands blue bands   Reps 10-12  10 minutes of strength training 10-12  10 minutes of strength training 10-12  10 minutes of strength training 10-12  10 minutes of strength training 10-15   Time  -  -  -  - 10 Minutes     Interval Training   Interval Training No No No No No     Bike   Level 5 5 5 6 6    Minutes 17 17 17 15 17      NuStep   Level 5  - 6 6 6    Minutes 17  - 17 17 17    METs 1.6  - 1.8 2.4 2.1     Track   Laps 15 16 16   - 16   Minutes 17 17 17   - 17     Home Exercise Plan   Plans to continue exercise at  -  - Home  -  -   Frequency  -  - Add 2 additional days to program exercise sessions.  -  -     Exercise Review   Progression  -  - Yes Yes -   Row Name 09/11/16 1619 09/16/16 1534 09/18/16 1548 09/23/16 1600 09/25/16 1500     Response to Exercise   Blood Pressure (Admit) 106/80 122/62 110/64 110/74 112/78   Blood Pressure (Exercise) 126/80 140/80 158/88 138/78 130/80   Blood Pressure (Exit) 118/78 116/68 108/68 120/70 106/60   Heart Rate (Admit) 99 bpm 91 bpm 88 bpm 87 bpm 89 bpm   Heart Rate (Exercise) 103 bpm 108 bpm 113 bpm 104 bpm 99 bpm   Heart Rate (Exit) 99 bpm 100 bpm 84 bpm 95 bpm 94 bpm    Oxygen Saturation (Admit) 93 % 93 % 94 % 93 % 92 %   Oxygen Saturation (Exercise) 90 % 92 % 93 % 91 % 93 %   Oxygen Saturation (Exit) 93 % 92 % 92 % 95 % 93 %   Rating of Perceived Exertion (Exercise) 12 12 12 12 12    Perceived Dyspnea (Exercise) 2 2 2 2 2    Duration Progress to 45 minutes of aerobic exercise without signs/symptoms of physical distress Progress to 45 minutes of aerobic exercise without signs/symptoms of physical distress Progress to 45 minutes of aerobic exercise without signs/symptoms of physical distress Continue with 45 min of aerobic exercise without signs/symptoms of  physical distress. Continue with 45 min of aerobic exercise without signs/symptoms of physical distress.   Intensity THRR unchanged THRR unchanged THRR unchanged THRR unchanged THRR unchanged     Progression   Progression Continue to progress workloads to maintain intensity without signs/symptoms of physical distress. Continue to progress workloads to maintain intensity without signs/symptoms of physical distress. Continue to progress workloads to maintain intensity without signs/symptoms of physical distress. Continue to progress workloads to maintain intensity without signs/symptoms of physical distress. Continue to progress workloads to maintain intensity without signs/symptoms of physical distress.     Resistance Training   Training Prescription Yes Yes Yes Yes Yes   Weight blue bands blue bands blue bands blue bands blue bands   Reps 10-15 10-15 10-15 10-15 10-15   Time 10 Minutes 10 Minutes 10 Minutes 10 Minutes 10 Minutes     Interval Training   Interval Training No No No No No     Bike   Level  - 6 7 7 7    Minutes  - 17 17 17 17      NuStep   Level 6 6 6 7   -   Minutes 17 17 17 17   -   METs 1.9 2 2.1 2.2  -     Track   Laps 14 15  - 15 15   Minutes 17 17  - 17 17     Exercise Review   Progression  -  - Yes Yes  -   Row Name 09/30/16 1500 10/07/16 1500 10/14/16 1500 10/16/16 1500 10/23/16  1500     Response to Exercise   Blood Pressure (Admit) 126/78 118/62 140/68 148/64 140/70   Blood Pressure (Exercise) 160/90 122/74 136/68 150/80 138/78   Blood Pressure (Exit) 112/60 104/68 114/70 122/68 116/62   Heart Rate (Admit) 68 bpm 79 bpm 82 bpm 82 bpm 80 bpm   Heart Rate (Exercise) 97 bpm 96 bpm 101 bpm 96 bpm 96 bpm   Heart Rate (Exit) 88 bpm 93 bpm 86 bpm 81 bpm 85 bpm   Oxygen Saturation (Admit) 93 % 93 % 96 % 92 % 94 %   Oxygen Saturation (Exercise) 90 % 91 % 93 % 92 % 91 %   Oxygen Saturation (Exit) 90 % 91 % 92 % 92 % 91 %   Rating of Perceived Exertion (Exercise) 12 13 12 12 12    Perceived Dyspnea (Exercise) 2 2 2 2 2    Duration Continue with 45 min of aerobic exercise without signs/symptoms of physical distress. Continue with 45 min of aerobic exercise without signs/symptoms of physical distress. Continue with 45 min of aerobic exercise without signs/symptoms of physical distress. Continue with 45 min of aerobic exercise without signs/symptoms of physical distress. Continue with 45 min of aerobic exercise without signs/symptoms of physical distress.   Intensity THRR unchanged THRR unchanged THRR unchanged THRR unchanged THRR unchanged     Progression   Progression Continue to progress workloads to maintain intensity without signs/symptoms of physical distress. Continue to progress workloads to maintain intensity without signs/symptoms of physical distress. Continue to progress workloads to maintain intensity without signs/symptoms of physical distress. Continue to progress workloads to maintain intensity without signs/symptoms of physical distress. Continue to progress workloads to maintain intensity without signs/symptoms of physical distress.     Resistance Training   Training Prescription Yes Yes Yes Yes Yes   Weight blue bands blue bands blue bands blue bands blue bands   Reps 10-15 10-15 10-15 10-15 10-15  Time 10 Minutes 10 Minutes 10 Minutes 10 Minutes 10 Minutes      Interval Training   Interval Training No No No No No     Bike   Level 7 7 7  7.5  -   Minutes 17 17 17 17   -     NuStep   Level 7 7 7 7 7    Minutes 17 17 17 17 17    METs 2.3 2.2 2.2 2.1 22     Track   Laps 15 15 14   - 16   Minutes 17 17 17   - 17     Exercise Review   Progression  -  -  - Yes  -   Row Name 10/28/16 1506 10/30/16 1600 11/11/16 1500 11/13/16 1600 11/18/16 1610     Response to Exercise   Blood Pressure (Admit) 120/76 120/60 138/80 120/80 118/56   Blood Pressure (Exercise) 154/72 140/84 140/74 150/80 164/80   Blood Pressure (Exit) 116/70 134/76 120/74 120/76 118/76   Heart Rate (Admit) 81 bpm 65 bpm 104 bpm 66 bpm 60 bpm   Heart Rate (Exercise) 98 bpm 99 bpm 88 bpm 90 bpm 85 bpm   Heart Rate (Exit) 84 bpm 86 bpm 62 bpm 82 bpm 62 bpm   Oxygen Saturation (Admit) 94 % 95 % 94 % 94 % 93 %   Oxygen Saturation (Exercise) 90 % 90 % 92 % 92 % 91 %   Oxygen Saturation (Exit) 93 % 92 % 93 % 95 % 92 %   Rating of Perceived Exertion (Exercise) 13 13 13 12 12    Perceived Dyspnea (Exercise) 2 2 2 2 2    Duration Continue with 45 min of aerobic exercise without signs/symptoms of physical distress. Continue with 45 min of aerobic exercise without signs/symptoms of physical distress. Continue with 45 min of aerobic exercise without signs/symptoms of physical distress. Continue with 45 min of aerobic exercise without signs/symptoms of physical distress. Continue with 45 min of aerobic exercise without signs/symptoms of physical distress.   Intensity THRR unchanged THRR unchanged THRR unchanged THRR unchanged THRR unchanged     Progression   Progression Continue to progress workloads to maintain intensity without signs/symptoms of physical distress. Continue to progress workloads to maintain intensity without signs/symptoms of physical distress. Continue to progress workloads to maintain intensity without signs/symptoms of physical distress. Continue to progress workloads to maintain  intensity without signs/symptoms of physical distress. Continue to progress workloads to maintain intensity without signs/symptoms of physical distress.     Resistance Training   Training Prescription Yes Yes Yes Yes Yes   Weight blue bands blue bands blue bands blue bands blue bands   Reps 10-15 10-15 10-15 10-15 10-15   Time 10 Minutes 10 Minutes 10 Minutes 10 Minutes 10 Minutes     Interval Training   Interval Training No No No No No     Bike   Level 7.5 8 8 8 8    Minutes 15 15 17 17 17      NuStep   Level 8  - 8  - 9   Minutes 17  - 17  - 17   METs 2.2  - 1.7  - 1.9     Track   Laps 16 15 16 16 16    Minutes 17 17 17 17 17      Exercise Review   Progression  - Yes  -  -  -   Row Name 11/20/16 1601 12/02/16 1500 12/04/16 1616 12/11/16 1600 12/16/16  1600     Response to Exercise   Blood Pressure (Admit) 136/60 136/70 122/62 124/64 150/74   Blood Pressure (Exercise) 172/82 148/80 140/70 124/74 158/80   Blood Pressure (Exit) 100/62 110/50 102/68 120/66 110/62   Heart Rate (Admit) 60 bpm 69 bpm 63 bpm 86 bpm 85 bpm   Heart Rate (Exercise) 81 bpm 93 bpm 92 bpm 97 bpm 100 bpm   Heart Rate (Exit) 63 bpm 76 bpm 60 bpm 91 bpm 93 bpm   Oxygen Saturation (Admit) 91 % 93 % 94 % 93 % 96 %   Oxygen Saturation (Exercise) 92 % 89 % 92 % 92 % 92 %   Oxygen Saturation (Exit) 93 % 90 % 91 % 94 % 91 %   Rating of Perceived Exertion (Exercise) 12 13 13 13 13    Perceived Dyspnea (Exercise) 2 2 2 2 2    Duration Continue with 45 min of aerobic exercise without signs/symptoms of physical distress. Continue with 45 min of aerobic exercise without signs/symptoms of physical distress. Continue with 45 min of aerobic exercise without signs/symptoms of physical distress. Continue with 45 min of aerobic exercise without signs/symptoms of physical distress. Continue with 45 min of aerobic exercise without signs/symptoms of physical distress.   Intensity THRR unchanged THRR unchanged THRR unchanged THRR  unchanged THRR unchanged     Progression   Progression Continue to progress workloads to maintain intensity without signs/symptoms of physical distress. Continue to progress workloads to maintain intensity without signs/symptoms of physical distress. Continue to progress workloads to maintain intensity without signs/symptoms of physical distress. Continue to progress workloads to maintain intensity without signs/symptoms of physical distress. Continue to progress workloads to maintain intensity without signs/symptoms of physical distress.     Resistance Training   Training Prescription Yes Yes Yes Yes Yes   Weight blue bands blue bands blue bands blue bands blue bands   Reps 10-15 10-15 10-15 10-15 10-15   Time 10 Minutes 10 Minutes 10 Minutes 10 Minutes 10 Minutes     Interval Training   Interval Training No No No No No     Bike   Level 8 8 8   - 8   Minutes 17 17 17   - 17     NuStep   Level 9 9  - 9 9   Minutes 17 17  - 17 17   METs 2 2.1  - 2.2 2.2     Track   Laps  - 14 14 15 14    Minutes  - 17 17 17 17       Exercise Comments:     Exercise Comments    Row Name 08/05/16 2979 08/25/16 1626 09/02/16 1647       Exercise Comments Patient's first day of rehab is today Patient has attended 5 sessions. Progressing well. Very open to working hard and increasing intensities. Sometimes needs to be reminded on working at the appropriate level. Home exercise completed        Exercise Goals and Review:   Exercise Goals Re-Evaluation :     Exercise Goals Re-Evaluation    Row Name 09/22/16 1203 09/22/16 1205 10/21/16 0802 11/17/16 1411 12/15/16 1157     Exercise Goal Re-Evaluation   Exercise Goals Review Increase Physical Activity;Increase Strenth and Stamina  - Increase Physical Activity;Increase Strenth and Stamina Increase Physical Activity;Increase Strenth and Stamina Increase Physical Activity;Increase Strenth and Stamina   Comments Patient is progressing well in Pulmonary  Rehab. Patient is open and motivated to increasing  workload intensities. Will cont. to monitor and progress as appropriate.  - Patient is progressing well in Pulmonary Rehab. Patient is open and motivated to increasing workload intensities. Will cont. to monitor and progress as appropriate. Patient is up to 16 laps (200 feet each) in 15 minutes. He is limited by his hip pain. Will cont. to monitor and progress as appropriate.  Patient is up to 16 laps (200 feet each) in 15 minutes. He is limited by his hip pain. Will cont. to monitor and progress as appropriate.    Expected Outcomes  - Patient will cont. to increase endurance and stamina through the exercises here at rehab. He will also practice his breathing techinques to become less short of breath on exertion.  Patient will cont. to increase endurance and stamina through the exercises here at rehab. He will also practice his breathing techinques to become less short of breath on exertion.  Through exercising at rehab and at home the patient will increase strength and stamina which will make ADL's easier to preform. Through exercising at rehab and at home, patient will increase physical strength and stamina and find ADL's easier to perform.       Discharge Exercise Prescription (Final Exercise Prescription Changes):     Exercise Prescription Changes - 12/16/16 1600      Response to Exercise   Blood Pressure (Admit) 150/74   Blood Pressure (Exercise) 158/80   Blood Pressure (Exit) 110/62   Heart Rate (Admit) 85 bpm   Heart Rate (Exercise) 100 bpm   Heart Rate (Exit) 93 bpm   Oxygen Saturation (Admit) 96 %   Oxygen Saturation (Exercise) 92 %   Oxygen Saturation (Exit) 91 %   Rating of Perceived Exertion (Exercise) 13   Perceived Dyspnea (Exercise) 2   Duration Continue with 45 min of aerobic exercise without signs/symptoms of physical distress.   Intensity THRR unchanged     Progression   Progression Continue to progress workloads to  maintain intensity without signs/symptoms of physical distress.     Resistance Training   Training Prescription Yes   Weight blue bands   Reps 10-15   Time 10 Minutes     Interval Training   Interval Training No     Bike   Level 8   Minutes 17     NuStep   Level 9   Minutes 17   METs 2.2     Track   Laps 14   Minutes 17      Nutrition:  Target Goals: Understanding of nutrition guidelines, daily intake of sodium <1518m, cholesterol <2033m calories 30% from fat and 7% or less from saturated fats, daily to have 5 or more servings of fruits and vegetables.  Biometrics:     Pre Biometrics - 07/24/16 1621      Pre Biometrics   Grip Strength 44 kg       Nutrition Therapy Plan and Nutrition Goals:     Nutrition Therapy & Goals - 08/12/16 1022      Nutrition Therapy   Diet Carb Modified, Therapeutic Lifestyle Changes     Personal Nutrition Goals   Nutrition Goal 1-2 lb wt loss/week to a wt loss goal of 6-24 lb at [facility] from [facility] regarding individualized specific dietary modifications aiming towards targeted core components such as weight, hypertension, lipid management, diabetes, heart failure and other comorbidities.   Expected Outcomes Short Term Goal: Understand basic  principles of dietary content, such as calories, fat, sodium, cholesterol and nutrients.;Long Term Goal: Adherence to prescribed nutrition plan.      Nutrition Discharge: Rate Your Plate Scores:     Nutrition Assessments - 08/12/16 1014      Rate Your Plate Scores   Pre Score 47      Nutrition Goals Re-Evaluation:   Nutrition Goals Discharge (Final Nutrition Goals Re-Evaluation):   Psychosocial: Target Goals: Acknowledge presence or absence of significant depression and/or stress, maximize coping skills, provide positive support system. Participant is able to verbalize types and ability to use  techniques and skills needed for reducing stress and depression.  Initial Review & Psychosocial Screening:     Initial Psych Review & Screening - 07/21/16 [place]? Yes     Barriers   Psychosocial barriers to participate in program There are no identifiable barriers or psychosocial needs.     Screening Interventions   Interventions Encouraged to exercise      Quality of Life Scores:   PHQ-9: Recent Review Flowsheet Data    Depression screen [place] 2/9 07/21/2016   Decreased Interest 0   Down, Depressed, Hopeless 0   PHQ - 2 Score 0     Interpretation of Total Score  Total Score Depression Severity:  1-4 = Minimal depression, 5-9 = Mild depression, 10-14 = Moderate depression, 15-19 = Moderately severe depression, 20-27 = Severe depression   Psychosocial Evaluation and Intervention:     Psychosocial Evaluation - 07/21/16 1503      Psychosocial Evaluation & Interventions   Interventions Encouraged to exercise with the program and follow exercise prescription      Psychosocial Re-Evaluation:     Psychosocial Re-Evaluation    [place] Name 08/05/16 1631 08/26/16 0911 09/18/16 0855 10/20/16 1424 11/18/16 1622     Psychosocial Re-Evaluation   Current issues with  -  - None Identified None Identified None Identified   Comments no psychosocial issues identified no psychosocial issues identified  -  -  -   Interventions Encouraged to attend Pulmonary Rehabilitation for the exercise Encouraged to attend Pulmonary Rehabilitation for the exercise Encouraged to attend Pulmonary Rehabilitation for the exercise Encouraged to attend Pulmonary Rehabilitation for the exercise Encouraged to attend Pulmonary Rehabilitation for the exercise   Continue Psychosocial Services  No No No Follow up required No Follow up required No Follow up required   [place] Name 12/18/16 0934             Psychosocial Re-Evaluation   Current issues with None Identified        Interventions Encouraged to attend Pulmonary Rehabilitation for the exercise       Continue Psychosocial Services  No Follow up required          Psychosocial Discharge (Final Psychosocial Re-Evaluation):     Psychosocial Re-Evaluation - 12/18/16 0934      Psychosocial Re-Evaluation   Current issues with None Identified   Interventions Encouraged to attend Pulmonary Rehabilitation for the exercise   Continue Psychosocial Services  No Follow up required      Education: Education Goals: Education classes will be provided on a weekly basis, covering required topics. Participant will state understanding/return demonstration of topics presented.  Learning Barriers/Preferences:   Education Topics: Risk Factor Reduction:  -Group instruction that is supported by a PowerPoint presentation. Instructor discusses the definition of a risk factor, different risk factors for pulmonary disease, and how the heart and  lungs work together.     Nutrition for Pulmonary Patient:  -Group instruction provided by PowerPoint slides, verbal discussion, and written materials to support subject matter. The instructor gives an explanation and review of healthy diet recommendations, which includes a discussion on weight management, recommendations for fruit and vegetable consumption, as well as protein, fluid, caffeine, fiber, sodium, sugar, and alcohol. Tips for eating when patients are short of breath are discussed.   PULMONARY REHAB OTHER RESPIRATORY from 12/11/2016 in [place]  Date  09/25/16  Educator  Parke Simmers  Instruction Review Code  2- meets goals/outcomes      Pursed Lip Breathing:  -Group instruction that is supported by demonstration and informational handouts. Instructor discusses the benefits of pursed lip and diaphragmatic breathing and detailed demonstration on how to preform both.     Oxygen Safety:  -Group instruction provided by PowerPoint, verbal  discussion, and written material to support subject matter. There is an overview of "What is Oxygen" and "Why do we need it".  Instructor also reviews how to create a safe environment for oxygen use, the importance of using oxygen as prescribed, and the risks of noncompliance. There is a brief discussion on traveling with oxygen and resources the patient may utilize.   PULMONARY REHAB OTHER RESPIRATORY from 12/11/2016 in [place]  Date  10/16/16  Educator  Truddie Crumble  Instruction Review Code  2- meets goals/outcomes      Oxygen Equipment:  -Group instruction provided by [employer] Staff utilizing handouts, written materials, and equipment demonstrations.   PULMONARY REHAB OTHER RESPIRATORY from 12/11/2016 in [place]  Date  12/04/16  Educator  [facility]  Instruction Review Code  2- meets goals/outcomes      Signs and Symptoms:  -Group instruction provided by written material and verbal discussion to support subject matter. Warning signs and symptoms of infection, stroke, and heart attack are reviewed and when to call the physician/911 reinforced. Tips for preventing the spread of infection discussed.   PULMONARY REHAB OTHER RESPIRATORY from 12/11/2016 in [place]  Date  11/13/16  Educator  rn  Instruction Review Code  2- meets goals/outcomes      Advanced Directives:  -Group instruction provided by verbal instruction and written material to support subject matter. Instructor reviews Advanced Directive laws and proper instruction for filling out document.   Pulmonary Video:  -Group video education that reviews the importance of medication and oxygen compliance, exercise, good nutrition, pulmonary hygiene, and pursed lip and diaphragmatic breathing for the pulmonary patient.   PULMONARY REHAB OTHER RESPIRATORY from 12/11/2016 in [place]  Date  10/23/16   Educator  STAFF  Instruction Review Code  2- meets goals/outcomes      Exercise for the Pulmonary Patient:  -Group instruction that is supported by a PowerPoint presentation. Instructor discusses benefits of exercise, core components of exercise, frequency, duration, and intensity of an exercise routine, importance of utilizing pulse oximetry during exercise, safety while exercising, and options of places to exercise outside of rehab.     PULMONARY REHAB OTHER RESPIRATORY from 12/11/2016 in [place]  Date  10/30/16  Educator  EP  Instruction Review Code  R- Review/reinforce      Pulmonary Medications:  -Verbally interactive group education provided by instructor with focus on inhaled medications and proper administration.   PULMONARY REHAB OTHER RESPIRATORY from 12/11/2016 in [place]  Date  08/07/16  Educator  Pharm  Instruction Review Code  2- meets goals/outcomes      Anatomy and Physiology of the Respiratory System and Intimacy:  -Group instruction provided by PowerPoint, verbal discussion, and written material to support subject matter. Instructor reviews respiratory cycle and anatomical components of the respiratory system and their functions. Instructor also reviews differences in obstructive and restrictive respiratory diseases with examples of each. Intimacy, Sex, and Sexuality differences are reviewed with a discussion on how relationships can change when diagnosed with pulmonary disease. Common sexual concerns are reviewed.   PULMONARY REHAB OTHER RESPIRATORY from 12/11/2016 in [place]  Date  11/20/16  Educator  portia  Instruction Review Code  2- meets goals/outcomes      MD DAY -A group question and answer session with a medical doctor that allows participants to ask questions that relate to their pulmonary disease state.   OTHER EDUCATION -Group or individual  verbal, written, or video instructions that support the educational goals of the pulmonary rehab program.   Knowledge Questionnaire Score:   Core Components/Risk Factors/Patient Goals at Admission:     Personal Goals and Risk Factors at Admission - 07/21/16 1457      Core Components/Risk Factors/Patient Goals on Admission    Weight Management Yes;Obesity   Intervention Obesity: Provide education and appropriate resources to help participant work on and attain dietary goals.;Weight Management/Obesity: Establish reasonable short term and long term weight goals.   Improve shortness of breath with ADL's Yes   Intervention Provide education, individualized exercise plan and daily activity instruction to help decrease symptoms of SOB with activities of daily living.   Expected Outcomes Short Term: Achieves a reduction of symptoms when performing activities of daily living.   Develop more efficient breathing techniques such as purse lipped breathing and diaphragmatic breathing; and practicing self-pacing with activity Yes   Intervention Provide education, demonstration and support about specific breathing techniuqes utilized for more efficient breathing. Include techniques such as pursed lipped breathing, diaphragmatic breathing and self-pacing activity.   Expected Outcomes Short Term: Participant will be able to demonstrate and use breathing techniques as needed throughout daily activities.   Increase knowledge of respiratory medications and ability to use respiratory devices properly  Yes   Intervention Provide education and demonstration as needed of appropriate use of medications, inhalers, and oxygen therapy.   Expected Outcomes Short Term: Achieves understanding of medications use. Understands that oxygen is a medication prescribed by physician. Demonstrates appropriate use of inhaler and oxygen therapy.   Diabetes Yes   Intervention Provide education about signs/symptoms and action to take  for hypo/hyperglycemia.;Provide education about proper nutrition, including hydration, and aerobic/resistive exercise prescription along with prescribed medications to achieve blood glucose in normal ranges: Fasting glucose 65-99 mg/dL   Expected Outcomes Short Term: Participant verbalizes understanding of the signs/symptoms and immediate care of hyper/hypoglycemia, proper foot care and importance of medication, aerobic/resistive exercise and nutrition plan for blood glucose control.;Long Term: Attainment of HbA1C < 7%.      Core Components/Risk Factors/Patient Goals Review:      Goals and Risk Factor Review    Row Name 08/05/16 1630 08/26/16 0909 09/18/16 0851 10/20/16 1421 11/18/16 1617     Core Components/Risk Factors/Patient Goals Review   Personal Goals Review Weight Management/Obesity;Increase Strength and Stamina;Improve shortness of breath with ADL's;Diabetes;Develop more efficient breathing techniques such as purse lipped breathing and diaphragmatic breathing and practicing self-pacing with activity.;Increase knowledge of  respiratory medications and ability to use respiratory devices properly. Weight Management/Obesity;Increase Strength and Stamina;Diabetes;Develop more efficient breathing techniques such as purse lipped breathing and diaphragmatic breathing and practicing self-pacing with activity.;Increase knowledge of respiratory medications and ability to use respiratory devices properly. Weight Management/Obesity;Develop more efficient breathing techniques such as purse lipped breathing and diaphragmatic breathing and practicing self-pacing with activity.;Increase knowledge of respiratory medications and ability to use respiratory devices properly. Weight Management/Obesity;Develop more efficient breathing techniques such as purse lipped breathing and diaphragmatic breathing and practicing self-pacing with activity.;Increase knowledge of respiratory medications and ability to use  respiratory devices properly. Weight Management/Obesity;Develop more efficient breathing techniques such as purse lipped breathing and diaphragmatic breathing and practicing self-pacing with activity.;Increase knowledge of respiratory medications and ability to use respiratory devices properly.   Review First day of exercise, too early to see progress progressing well, involved, asking questions, seems interested in what our program has to offer, no weight loss No weight loss, maintaining his diabetes well with exercise, workloads increases are tolerated well, attending education classes and asking lots of questions has lost 1 kg, progressing well with equipment workload increases essentially no weight loss, progressing well on equipment, independent with equipment, diabetes controlled.   Expected Outcomes should see progress in next 30 days continue to increase strength and knowledge base expect great progress, he is committed has 4 sessions left, expect him to continue exercising after graduation from program actually is covered by [place] to exercise with [place] til December 26, 2016   [place] Name 12/18/16 2500             Core Components/Risk Factors/Patient Goals Review   Personal Goals Review Weight Management/Obesity;Develop more efficient breathing techniques such as purse lipped breathing and diaphragmatic breathing and practicing self-pacing with activity.;Increase knowledge of respiratory medications and ability to use respiratory devices properly.       Review No weight loss, but has done well in program regarding strength, stamina.  up to level 8 on nustep, level 8 on scifit bike, 14-15 laps on track.  Will graduate next week.       Expected Outcomes To continue exercising independently after graduation from program.          Core Components/Risk Factors/Patient Goals at Discharge (Final Review):      Goals and Risk Factor Review - 12/18/16 0931      Core Components/Risk Factors/Patient Goals  Review   Personal Goals Review Weight Management/Obesity;Develop more efficient breathing techniques such as purse lipped breathing and diaphragmatic breathing and practicing self-pacing with activity.;Increase knowledge of respiratory medications and ability to use respiratory devices properly.   Review No weight loss, but has done well in program regarding strength, stamina.  up to level 8 on nustep, level 8 on scifit bike, 14-15 laps on track.  Will graduate next week.   Expected Outcomes To continue exercising independently after graduation from program.      ITP Comments:   Comments: ITP REVIEW Pt is making expected progress toward pulmonary rehab goals after completing 30 sessions. Recommend continued exercise, life style modification, education, and utilization of breathing techniques to increase stamina and strength and decrease shortness of breath with exertion.

## 2016-12-23 ENCOUNTER — Encounter (HOSPITAL_COMMUNITY): Payer: Medicare HMO

## 2016-12-25 ENCOUNTER — Encounter (HOSPITAL_COMMUNITY): Payer: Medicare HMO

## 2016-12-30 ENCOUNTER — Encounter (HOSPITAL_COMMUNITY): Payer: Medicare HMO

## 2017-01-01 ENCOUNTER — Encounter (HOSPITAL_COMMUNITY): Payer: Medicare HMO

## 2017-01-29 NOTE — Addendum Note (Signed)
Encounter addended by: Lance Morin, RN on: 01/29/2017  2:15 PM<BR>    Actions taken: Sign clinical note, Episode resolved

## 2017-01-29 NOTE — Addendum Note (Signed)
Encounter addended by: Jewel Baize, RD on: 01/29/2017  7:13 AM<BR>    Actions taken: Flowsheet data copied forward, Visit Navigator Flowsheet section accepted

## 2017-01-29 NOTE — Progress Notes (Signed)
Discharge Summary  Patient Details  Name: Samuel Watkins MRN: [id number] Date of Birth: 17-Apr-1946 Referring Provider:     Pulmonary Rehab Walk Test from 07/24/2016 in [place]  Referring Provider  Dr. Gwenette Greet       Number of Visits: 30  Reason for Discharge:  Patient reached a stable level of exercise. Patient independent in their exercise.  Smoking History:  History  Smoking Status  . Former Smoker  . Packs/day: 1.00  . Years: 50.00  . Types: Cigarettes  . Quit date: 07/14/2010  Smokeless Tobacco  . Never Used    Comment: Counseled to remain smoke free.    Diagnosis:  Pulmonary emphysema, unspecified emphysema type ([place])  ADL UCSD:     Pulmonary Assessment Scores    Row Name 12/22/16 1019         ADL UCSD   ADL Phase Exit     SOB Score total 23        Initial Exercise Prescription:   Discharge Exercise Prescription (Final Exercise Prescription Changes):     Exercise Prescription Changes - 12/16/16 1600      Response to Exercise   Blood Pressure (Admit) 150/74   Blood Pressure (Exercise) 158/80   Blood Pressure (Exit) 110/62   Heart Rate (Admit) 85 bpm   Heart Rate (Exercise) 100 bpm   Heart Rate (Exit) 93 bpm   Oxygen Saturation (Admit) 96 %   Oxygen Saturation (Exercise) 92 %   Oxygen Saturation (Exit) 91 %   Rating of Perceived Exertion (Exercise) 13   Perceived Dyspnea (Exercise) 2   Duration Continue with 45 min of aerobic exercise without signs/symptoms of physical distress.   Intensity THRR unchanged     Progression   Progression Continue to progress workloads to maintain intensity without signs/symptoms of physical distress.     Resistance Training   Training Prescription Yes   Weight blue bands   Reps 10-15   Time 10 Minutes     Interval Training   Interval Training No     Bike   Level 8   Minutes 17     NuStep   Level 9   Minutes 17   METs 2.2     Track   Laps 14   Minutes 17       Functional Capacity:     6 Minute Walk    Row Name 12/18/16 1553         6 Minute Walk   Phase Discharge     Distance 1550 feet     Walk Time 6 minutes     # of Rest Breaks 0     MPH 2.93     METS 3.22     RPE 13     Perceived Dyspnea  2     Symptoms No     Comments leg fatigue     Resting HR 99 bpm     Resting BP 114/56     Max Ex. HR 107 bpm     Max Ex. BP 150/80       Interval HR   Baseline HR 99     1 Minute HR 98     2 Minute HR 104     3 Minute HR 104     4 Minute HR 105     5 Minute HR 107     6 Minute HR 107     2 Minute Post HR 107  Interval Heart Rate? Yes       Interval Oxygen   Interval Oxygen? Yes     Baseline Oxygen Saturation % 91 %     Baseline Liters of Oxygen 0 L     1 Minute Oxygen Saturation % 94 %     1 Minute Liters of Oxygen 0 L     2 Minute Oxygen Saturation % 90 %     2 Minute Liters of Oxygen 0 L     3 Minute Oxygen Saturation % 90 %     3 Minute Liters of Oxygen 0 L     4 Minute Oxygen Saturation % 90 %     4 Minute Liters of Oxygen 0 L     5 Minute Oxygen Saturation % 90 %     5 Minute Liters of Oxygen 0 L     6 Minute Oxygen Saturation % 91 %     6 Minute Liters of Oxygen 0 L     2 Minute Post Oxygen Saturation % 93 %     2 Minute Post Liters of Oxygen 0 L        Psychological, QOL, Others - Outcomes: PHQ 2/9: Depression screen [facility] 2/9 12/18/2016 07/21/2016  Decreased Interest 0 0  Down, Depressed, Hopeless 0 0  PHQ - 2 Score 0 0    Quality of Life:     Quality of Life - 12/22/16 1018      Quality of Life Scores   Health/Function Post 29.57 %   Socioeconomic Post 30 %   Psych/Spiritual Post 30 %   Family Post 30 %   GLOBAL Post 29.81 %      Personal Goals: Goals established at orientation with interventions provided to work toward goal.    Personal Goals Discharge:     Goals and Risk Factor Review    Row Name 08/05/16 1630 08/26/16 0909 09/18/16 0851 10/20/16 1421 11/18/16 1617     Core  Components/Risk Factors/Patient Goals Review   Personal Goals Review Weight Management/Obesity;Increase Strength and Stamina;Improve shortness of breath with ADL's;Diabetes;Develop more efficient breathing techniques such as purse lipped breathing and diaphragmatic breathing and practicing self-pacing with activity.;Increase knowledge of respiratory medications and ability to use respiratory devices properly. Weight Management/Obesity;Increase Strength and Stamina;Diabetes;Develop more efficient breathing techniques such as purse lipped breathing and diaphragmatic breathing and practicing self-pacing with activity.;Increase knowledge of respiratory medications and ability to use respiratory devices properly. Weight Management/Obesity;Develop more efficient breathing techniques such as purse lipped breathing and diaphragmatic breathing and practicing self-pacing with activity.;Increase knowledge of respiratory medications and ability to use respiratory devices properly. Weight Management/Obesity;Develop more efficient breathing techniques such as purse lipped breathing and diaphragmatic breathing and practicing self-pacing with activity.;Increase knowledge of respiratory medications and ability to use respiratory devices properly. Weight Management/Obesity;Develop more efficient breathing techniques such as purse lipped breathing and diaphragmatic breathing and practicing self-pacing with activity.;Increase knowledge of respiratory medications and ability to use respiratory devices properly.   Review First day of exercise, too early to see progress progressing well, involved, asking questions, seems interested in what our program has to offer, no weight loss No weight loss, maintaining his diabetes well with exercise, workloads increases are tolerated well, attending education classes and asking lots of questions has lost 1 kg, progressing well with equipment workload increases essentially no weight loss,  progressing well on equipment, independent with equipment, diabetes controlled.   Expected Outcomes should see progress in next 30 days continue to increase strength  and knowledge base expect great progress, he is committed has 4 sessions left, expect him to continue exercising after graduation from program actually is covered by [place] to exercise with [place] til December 26, 2016   [place] Name 12/18/16 8177             Core Components/Risk Factors/Patient Goals Review   Personal Goals Review Weight Management/Obesity;Develop more efficient breathing techniques such as purse lipped breathing and diaphragmatic breathing and practicing self-pacing with activity.;Increase knowledge of respiratory medications and ability to use respiratory devices properly.       Review No weight loss, but has done well in program regarding strength, stamina.  up to level 8 on nustep, level 8 on scifit bike, 14-15 laps on track.  Will graduate next week.       Expected Outcomes To continue exercising independently after graduation from program.          Nutrition & Weight - Outcomes:    Nutrition:     Nutrition Therapy & Goals - 08/12/16 1022      Nutrition Therapy   Diet Carb Modified, Therapeutic Lifestyle Changes     Personal Nutrition Goals   Nutrition Goal 1-2 lb wt loss/week to a wt loss goal of 6-24 lb at graduation from [place], educate and counsel regarding individualized specific dietary modifications aiming towards targeted core components such as weight, hypertension, lipid management, diabetes, heart failure and other comorbidities.   Expected Outcomes Short Term Goal: Understand basic principles of dietary content, such as calories, fat, sodium, cholesterol and nutrients.;Long Term Goal: Adherence to prescribed nutrition plan.      Nutrition Discharge:     Nutrition Assessments - 12/22/16 1554      Rate Your Plate Scores   Pre Score 47    Post Score 57      Education Questionnaire Score:     Knowledge Questionnaire Score - 12/22/16 1019      Knowledge Questionnaire Score   Pre Score 9/13      Goals reviewed with patient; copy given to patient.

## 2017-04-30 DIAGNOSIS — J449 Chronic obstructive pulmonary disease, unspecified: Secondary | ICD-10-CM | POA: Diagnosis not present

## 2017-04-30 DIAGNOSIS — Z23 Encounter for immunization: Secondary | ICD-10-CM | POA: Diagnosis not present

## 2017-04-30 DIAGNOSIS — E1149 Type 2 diabetes mellitus with other diabetic neurological complication: Secondary | ICD-10-CM | POA: Diagnosis not present

## 2017-04-30 DIAGNOSIS — Z0000 Encounter for general adult medical examination without abnormal findings: Secondary | ICD-10-CM | POA: Diagnosis not present

## 2017-06-01 ENCOUNTER — Ambulatory Visit (INDEPENDENT_AMBULATORY_CARE_PROVIDER_SITE_OTHER)
Admission: RE | Admit: 2017-06-01 | Discharge: 2017-06-01 | Disposition: A | Discharge status: Home / Self Care | Payer: Medicare HMO | Source: Ambulatory Visit | Attending: Acute Care | Admitting: Acute Care

## 2017-06-01 ENCOUNTER — Other Ambulatory Visit: Payer: Self-pay | Admitting: Acute Care

## 2017-06-01 DIAGNOSIS — Z87891 Personal history of nicotine dependence: Secondary | ICD-10-CM | POA: Diagnosis not present

## 2017-06-01 DIAGNOSIS — Z122 Encounter for screening for malignant neoplasm of respiratory organs: Secondary | ICD-10-CM

## 2018-01-18 ENCOUNTER — Encounter: Payer: Self-pay | Admitting: Interventional Cardiology

## 2018-01-31 NOTE — Progress Notes (Addendum)
Cardiology Office Note:    Date:  02/02/2018   ID:  [name], DOB 10/15/1945, MRN [id number]  PCP:  Lawerance Cruel, MD  Cardiologist:  No primary care provider on file.   Referring MD: Lawerance Cruel, MD   Chief Complaint  Patient presents with  . Coronary Artery Disease    History of Present Illness:    Samuel Watkins is a 72 y.o. male with a hx of coronary artery disease, history of bare metal stent RCA and drug-eluting stent RCA 2001 and 2008 respectively, and LAD DES 2008.He is managed at the [facility] in [place] (Samuel Sewer M.D.) and [place] at [facility] Samuel Crutch M.D.)  He is doing relatively well.  Cannot walk very far before her legs hurt and started to get a week.  He also has exertional intolerance related to dyspnea.  He has moderate COPD.  Does not need to use nitroglycerin.  Denies wheezing.  No longer smokes cigarettes.   Past Medical History:  Diagnosis Date  . CAD (coronary artery disease)   . Chronic hepatitis B ([place])   . Hyperlipemia   . Hypertension     Past Surgical History:  Procedure Laterality Date  . ABDOMINAL AORTIC ANEURYSM REPAIR    . [place] SURGERY  1997  . LUNG SURGERY     found spot on lung 10 years ago    Current Medications: Current Meds  Medication Sig  . albuterol (PROVENTIL HFA;VENTOLIN HFA) 108 (90 BASE) MCG/ACT inhaler Inhale 2 puffs into the lungs 4 (four) times daily as needed for wheezing or shortness of breath.  Marland Kitchen aspirin EC 81 MG tablet Take 1 tablet (81 mg total) by mouth daily.  Marland Kitchen atorvastatin (LIPITOR) 80 MG tablet Take 80 mg by mouth daily.    . budesonide-formoterol (SYMBICORT) 160-4.5 MCG/ACT inhaler Inhale 2 puffs into the lungs 2 (two) times daily.  . Cholecalciferol (VITAMIN D3) 5000 units CAPS Take 5,000 Units by mouth daily.  Marland Kitchen glimepiride (AMARYL) 2 MG tablet Take 2 mg by mouth daily with breakfast.  . losartan (COZAAR) 100 MG tablet Take 50 mg by mouth daily.  . Magnesium  Oxide 420 MG TABS Take 2 tablets by mouth daily. EXCEPT Sundays  . metFORMIN (GLUCOPHAGE) 500 MG tablet Take 1,000 mg by mouth 2 (two) times daily with a meal.   . niacin 500 MG tablet Take 1,000 mg by mouth at bedtime.  . nitroGLYCERIN (NITROSTAT) 0.4 MG SL tablet Place 0.4 mg under the tongue every 5 (five) minutes x 3 doses as needed for chest pain (Call 911 at 3rd dose within 15 minutes.).  Marland Kitchen pantoprazole (PROTONIX) 40 MG tablet Take 40 mg by mouth daily.  . Tiotropium Bromide-Olodaterol (STIOLTO RESPIMAT) 2.5-2.5 MCG/ACT AERS Inhale 2 puffs into the lungs daily.     Allergies:   Patient has no known allergies.   Social History   Socioeconomic History  . Marital status: Married    Spouse name: Not on file  . Number of children: 3  . Years of education: Not on file  . Highest education level: Not on file  Occupational History  . Occupation: retired  Scientific laboratory technician  . Financial resource strain: Not on file  . Food insecurity:    Worry: Not on file    Inability: Not on file  . Transportation needs:    Medical: Not on file    Non-medical: Not on file  Tobacco Use  . Smoking status: Former Smoker  Packs/day: 1.00    Years: 50.00    Pack years: 50.00    Types: Cigarettes    Last attempt to quit: 07/14/2010    Years since quitting: 7.5  . Smokeless tobacco: Never Used  . Tobacco comment: Counseled to remain smoke free.  Substance and Sexual Activity  . Alcohol use: No    Alcohol/week: 0.0 oz  . Drug use: No  . Sexual activity: Not on file  Lifestyle  . Physical activity:    Days per week: Not on file    Minutes per session: Not on file  . Stress: Not on file  Relationships  . Social connections:    Talks on phone: Not on file    Gets together: Not on file    Attends religious service: Not on file    Active member of club or organization: Not on file    Attends meetings of clubs or organizations: Not on file    Relationship status: Not on file  Other Topics Concern    . Not on file  Social History Narrative  . Not on file     Family History: The patient's family history includes Heart disease in his father and mother; Throat cancer in his brother.  ROS:   Please see the history of present illness.    Shortness of breath, fatigue, wears CPAP at night when he sleeps.  Lung nodules on CT being followed annually.  All other systems reviewed and are negative.  EKGs/Labs/Other Studies Reviewed:    The following studies were reviewed today:  June 01, 2017 CT chest lung: No cancer identified. Lung-RADS 2, benign appearance or behavior. Continue annual screening with low-dose chest CT without contrast in 12 months.   Aortic Atherosclerosis (ICD10-I70.0) and Emphysema (ICD10-J43.9).       EKG:  EKG is  ordered today.  The ekg ordered today demonstrates sinus rhythm with right bundle and no significant change when compared to prior studies.  Recent Labs: No results found for requested labs within last 8760 hours.  Recent Lipid Panel    Component Value Date/Time   CHOL  07/10/2007 0345    189        ATP III CLASSIFICATION:  <200     mg/dL   Desirable  200-239  mg/dL   Borderline High  >=240    mg/dL   High   TRIG 105 07/10/2007 0345   HDL 18 (L) 07/10/2007 0345   CHOLHDL 10.5 07/10/2007 0345   VLDL 21 07/10/2007 0345   LDLCALC (H) 07/10/2007 0345    150        Total Cholesterol/HDL:CHD Risk Coronary Heart Disease Risk Table                     Men   Women  1/2 Average Risk   3.4   3.3    Physical Exam:    VS:  BP 140/86   Pulse 61   Ht 6\' 2"  (1.88 m)   Wt 231 lb 9.6 oz (105.1 kg)   BMI 29.74 kg/m      Wt Readings from Last 3 Encounters:  02/02/18 231 lb 9.6 oz (105.1 kg)  12/11/16 235 lb 3.7 oz (106.7 kg)  12/04/16 234 lb 2.1 oz (106.2 kg)     GEN:  Well nourished, well developed in no acute distress HEENT: Normal NECK: No JVD. LYMPHATICS: No lymphadenopathy CARDIAC: RRR, nomurmur, nogallop, no edema. VASCULAR:  Diminished left popliteal and bilateral dorsalis pedis pulses.  No bruits. RESPIRATORY:  Clear to auscultation without rales, wheezing or rhonchi  ABDOMEN: Soft, non-tender, non-distended, No pulsatile mass, MUSCULOSKELETAL: No deformity  SKIN: Warm and dry NEUROLOGIC:  Alert and oriented x 3 PSYCHIATRIC:  Normal affect   ASSESSMENT:    1. CAD in native artery   2. Essential hypertension   3. Hyperlipidemia, unspecified hyperlipidemia type   4. Other emphysema ([place])   5. PAD (peripheral artery disease) (H[facility])    PLAN:    In order of problems listed above:  1. Stable with no overt anginal complaints.  Exertional fatigue could be related to CAD although it sounds more claudication related.   2. Adequate blood pressure control target 140/90 mmHg.  Low-salt diet encouraged. 3. LDL target less than 70.  All laboratory data is being done at the [place].  He will bring the [place] laboratory print out to [place] or we will have to send for it.  Elevated triglycerides should be treated with VASCEPA in addition to statin therapy for LDL. 4. States he was recently told by Dr. Gwenette Greet that he has moderately severe COPD. 5. Bilateral lower extremity Doppler study to exclude significant obstructive disease.   ADDENDUM: 02/03/2018 Laboratory data from the [facility] outpatient facility demonstrate a total cholesterol of 101, LDL cholesterol of 32, HDL of 26, triglyceride of 214, hemoglobin A1c of 7.2.  Medication Adjustments/Labs and Tests Ordered: Current medicines are reviewed at length with the patient today.  Concerns regarding medicines are outlined above.  Orders Placed This Encounter  Procedures  . EKG 12-Lead   No orders of the defined types were placed in this encounter.   There are no Patient Instructions on file for this visit.   Signed, Sinclair Grooms, MD  02/02/2018 2:58 PM    [place]

## 2018-02-02 ENCOUNTER — Ambulatory Visit: Payer: Medicare HMO | Admitting: Interventional Cardiology

## 2018-02-02 ENCOUNTER — Encounter: Payer: Self-pay | Admitting: Interventional Cardiology

## 2018-02-02 ENCOUNTER — Other Ambulatory Visit: Payer: Self-pay | Admitting: Interventional Cardiology

## 2018-02-02 VITALS — BP 140/86 | HR 61 | Ht 74.0 in | Wt 231.6 lb

## 2018-02-02 DIAGNOSIS — E785 Hyperlipidemia, unspecified: Secondary | ICD-10-CM

## 2018-02-02 DIAGNOSIS — I739 Peripheral vascular disease, unspecified: Secondary | ICD-10-CM

## 2018-02-02 DIAGNOSIS — J438 Other emphysema: Secondary | ICD-10-CM

## 2018-02-02 DIAGNOSIS — I2510 Atherosclerotic heart disease of native coronary artery without angina pectoris: Secondary | ICD-10-CM | POA: Diagnosis not present

## 2018-02-02 DIAGNOSIS — I10 Essential (primary) hypertension: Secondary | ICD-10-CM | POA: Diagnosis not present

## 2018-02-02 NOTE — Patient Instructions (Signed)
Medication Instructions:  Your physician recommends that you continue on your current medications as directed. Please refer to the Current Medication list given to you today.   Labwork: None  Testing/Procedures: Your physician has requested that you have a lower extremity arterial duplex. This test is an ultrasound of the arteries in the legs or arms. It looks at arterial blood flow in the legs and arms. Allow one hour for Lower and Upper Arterial scans. There are no restrictions or special instructions   Follow-Up: Your physician wants you to follow-up in: 1 year with Dr. Tamala Julian. You will receive a reminder letter in the mail two months in advance. If you don't receive a letter, please call our office to schedule the follow-up appointment.   If you need a refill on your cardiac medications before your next appointment, please call your pharmacy.

## 2018-02-03 ENCOUNTER — Ambulatory Visit (HOSPITAL_COMMUNITY)
Admission: RE | Admit: 2018-02-03 | Discharge: 2018-02-03 | Disposition: A | Discharge status: Home / Self Care | Payer: Medicare HMO | Source: Ambulatory Visit | Attending: Cardiology | Admitting: Cardiology

## 2018-02-03 DIAGNOSIS — I739 Peripheral vascular disease, unspecified: Secondary | ICD-10-CM | POA: Diagnosis not present

## 2018-06-02 ENCOUNTER — Ambulatory Visit (INDEPENDENT_AMBULATORY_CARE_PROVIDER_SITE_OTHER)
Admission: RE | Admit: 2018-06-02 | Discharge: 2018-06-02 | Disposition: A | Discharge status: Home / Self Care | Payer: Medicare HMO | Source: Ambulatory Visit | Attending: Acute Care | Admitting: Acute Care

## 2018-06-02 DIAGNOSIS — Z122 Encounter for screening for malignant neoplasm of respiratory organs: Secondary | ICD-10-CM

## 2018-06-02 DIAGNOSIS — Z87891 Personal history of nicotine dependence: Secondary | ICD-10-CM | POA: Diagnosis not present

## 2018-06-07 ENCOUNTER — Telehealth: Payer: Self-pay | Admitting: Acute Care

## 2018-06-07 DIAGNOSIS — Z87891 Personal history of nicotine dependence: Secondary | ICD-10-CM

## 2018-06-07 DIAGNOSIS — Z122 Encounter for screening for malignant neoplasm of respiratory organs: Secondary | ICD-10-CM

## 2018-06-07 NOTE — Telephone Encounter (Signed)
Pt informed of CT results per Sarah Groce, NP.  PT verbalized understanding.  Copy sent to PCP.  Order placed for 1 yr f/u CT.  

## 2018-06-21 DIAGNOSIS — E782 Mixed hyperlipidemia: Secondary | ICD-10-CM | POA: Diagnosis not present

## 2018-06-21 DIAGNOSIS — I10 Essential (primary) hypertension: Secondary | ICD-10-CM | POA: Diagnosis not present

## 2018-06-21 DIAGNOSIS — E559 Vitamin D deficiency, unspecified: Secondary | ICD-10-CM | POA: Diagnosis not present

## 2018-06-21 DIAGNOSIS — E1149 Type 2 diabetes mellitus with other diabetic neurological complication: Secondary | ICD-10-CM | POA: Diagnosis not present

## 2018-06-21 DIAGNOSIS — Z125 Encounter for screening for malignant neoplasm of prostate: Secondary | ICD-10-CM | POA: Diagnosis not present

## 2018-06-22 DIAGNOSIS — I700 Atherosclerosis of aorta: Secondary | ICD-10-CM | POA: Diagnosis not present

## 2018-06-22 DIAGNOSIS — G629 Polyneuropathy, unspecified: Secondary | ICD-10-CM | POA: Diagnosis not present

## 2018-06-22 DIAGNOSIS — J449 Chronic obstructive pulmonary disease, unspecified: Secondary | ICD-10-CM | POA: Diagnosis not present

## 2018-06-22 DIAGNOSIS — E559 Vitamin D deficiency, unspecified: Secondary | ICD-10-CM | POA: Diagnosis not present

## 2018-06-22 DIAGNOSIS — E782 Mixed hyperlipidemia: Secondary | ICD-10-CM | POA: Diagnosis not present

## 2018-06-22 DIAGNOSIS — E1149 Type 2 diabetes mellitus with other diabetic neurological complication: Secondary | ICD-10-CM | POA: Diagnosis not present

## 2018-06-22 DIAGNOSIS — Z0000 Encounter for general adult medical examination without abnormal findings: Secondary | ICD-10-CM | POA: Diagnosis not present

## 2018-06-22 DIAGNOSIS — I739 Peripheral vascular disease, unspecified: Secondary | ICD-10-CM | POA: Diagnosis not present

## 2018-06-22 DIAGNOSIS — N183 Chronic kidney disease, stage 3 (moderate): Secondary | ICD-10-CM | POA: Diagnosis not present

## 2018-10-11 DIAGNOSIS — E782 Mixed hyperlipidemia: Secondary | ICD-10-CM | POA: Diagnosis not present

## 2018-10-11 DIAGNOSIS — I2510 Atherosclerotic heart disease of native coronary artery without angina pectoris: Secondary | ICD-10-CM | POA: Diagnosis not present

## 2018-10-11 DIAGNOSIS — J439 Emphysema, unspecified: Secondary | ICD-10-CM | POA: Diagnosis not present

## 2018-10-11 DIAGNOSIS — E1149 Type 2 diabetes mellitus with other diabetic neurological complication: Secondary | ICD-10-CM | POA: Diagnosis not present

## 2018-10-11 DIAGNOSIS — N183 Chronic kidney disease, stage 3 (moderate): Secondary | ICD-10-CM | POA: Diagnosis not present

## 2018-10-11 DIAGNOSIS — J449 Chronic obstructive pulmonary disease, unspecified: Secondary | ICD-10-CM | POA: Diagnosis not present

## 2018-10-11 DIAGNOSIS — I10 Essential (primary) hypertension: Secondary | ICD-10-CM | POA: Diagnosis not present

## 2018-12-28 DIAGNOSIS — E1149 Type 2 diabetes mellitus with other diabetic neurological complication: Secondary | ICD-10-CM | POA: Diagnosis not present

## 2018-12-28 DIAGNOSIS — G629 Polyneuropathy, unspecified: Secondary | ICD-10-CM | POA: Diagnosis not present

## 2019-03-03 DIAGNOSIS — L237 Allergic contact dermatitis due to plants, except food: Secondary | ICD-10-CM | POA: Diagnosis not present

## 2019-03-31 ENCOUNTER — Telehealth: Payer: Self-pay | Admitting: Interventional Cardiology

## 2019-03-31 NOTE — Telephone Encounter (Signed)
Left message letting pt know that Dr. Tamala Julian will not be in the office 9/29 but will be doing virtual/telephone visits instead.  Advised to call back to make arrangements for appt.

## 2019-04-11 NOTE — Telephone Encounter (Signed)
Pt called back and rescheduled to November

## 2019-04-12 ENCOUNTER — Ambulatory Visit: Payer: Medicare HMO | Admitting: Interventional Cardiology

## 2019-06-01 NOTE — Progress Notes (Signed)
No entry 

## 2019-06-03 ENCOUNTER — Encounter: Payer: Self-pay | Admitting: Interventional Cardiology

## 2019-06-03 ENCOUNTER — Encounter (INDEPENDENT_AMBULATORY_CARE_PROVIDER_SITE_OTHER): Payer: Self-pay

## 2019-06-03 ENCOUNTER — Other Ambulatory Visit: Payer: Self-pay

## 2019-06-03 ENCOUNTER — Ambulatory Visit (INDEPENDENT_AMBULATORY_CARE_PROVIDER_SITE_OTHER): Payer: Medicare HMO | Admitting: Interventional Cardiology

## 2019-06-03 VITALS — BP 154/88 | HR 58 | Ht 74.0 in | Wt 212.3 lb

## 2019-06-03 DIAGNOSIS — E785 Hyperlipidemia, unspecified: Secondary | ICD-10-CM

## 2019-06-03 DIAGNOSIS — I2510 Atherosclerotic heart disease of native coronary artery without angina pectoris: Secondary | ICD-10-CM | POA: Diagnosis not present

## 2019-06-03 DIAGNOSIS — Z7189 Other specified counseling: Secondary | ICD-10-CM | POA: Diagnosis not present

## 2019-06-03 DIAGNOSIS — I739 Peripheral vascular disease, unspecified: Secondary | ICD-10-CM

## 2019-06-03 DIAGNOSIS — I10 Essential (primary) hypertension: Secondary | ICD-10-CM | POA: Diagnosis not present

## 2019-06-03 NOTE — Patient Instructions (Addendum)
Medication Instructions:  1) TAKE Losartan 100mg  once daily  *If you need a refill on your cardiac medications before your next appointment, please call your pharmacy*  Lab Work: BMET, Lipid and Liver when you come in for Hypertension Clinic appointment. If you have labs (blood work) drawn today and your tests are completely normal, you will receive your results only by: Marland Kitchen MyChart Message (if you have MyChart) OR . A paper copy in the mail If you have any lab test that is abnormal or we need to change your treatment, we will call you to review the results.  Testing/Procedures: None  Follow-Up:  Your physician recommends that you schedule a follow-up appointment in: 2 weeks with Hypertension Clinic.   At [facility], you and your health needs are our priority.  As part of our continuing mission to provide you with exceptional heart care, we have created designated Provider Care Teams.  These Care Teams include your primary Cardiologist (physician) and Advanced Practice Providers (APPs -  Physician Assistants and Nurse Practitioners) who all work together to provide you with the care you need, when you need it.  Your next appointment:   12 month(s)  The format for your next appointment:   In Person  Provider:   You may see Sinclair Grooms, MD or one of the following Advanced Practice Providers on your designated Care Team:    Truitt Merle, NP  Cecilie Kicks, NP  Kathyrn Drown, NP   Other Instructions

## 2019-06-03 NOTE — Addendum Note (Signed)
Addended by: Carylon Perches on: 06/03/2019 05:12 PM   Modules accepted: Orders

## 2019-06-03 NOTE — Progress Notes (Signed)
Cardiology Office Note:    Date:  06/03/2019   ID:  Samuel Watkins, DOB June 30, 1946, MRN [id number]  PCP:  Samuel Cruel, MD  Cardiologist:  Samuel Grooms, MD   Referring MD: Samuel Cruel, MD   Chief Complaint  Patient presents with  . Coronary Artery Disease  . Hyperlipidemia    History of Present Illness:    Samuel Watkins is a 73 y.o. male with a hx of coronary artery disease, history of bare metal stent RCA and drug-eluting stent RCA 2001and2008 respectively, and LAD DES 2008.He is managed at the [facility] in [facility].D.) and Samuel Watkins at [facility] Samuel Watkins M.D.)  Samuel Watkins is doing well.  He denies angina.  He ran around his neighborhood this morning chasing one of his dogs.  There was no discomfort or particular complaint.  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  He gets more than 30 minutes of movement at least 5 days of each week.  His primary physician is Dr. Melinda Watkins.  He gets his medications from the [facility].  During this pandemic, he has not had any blood work done since December 2020.  Past Medical History:  Diagnosis Date  . CAD (coronary artery disease)   . Chronic hepatitis B ([place])   . Hyperlipemia   . Hypertension     Past Surgical History:  Procedure Laterality Date  . ABDOMINAL AORTIC ANEURYSM REPAIR    . [place] SURGERY  1997  . LUNG SURGERY     found spot on lung 10 years ago    Current Medications: No outpatient medications have been marked as taking for the 06/03/19 encounter (Office Visit) with Belva Crome, MD.     Allergies:   Patient has no known allergies.   Social History   Socioeconomic History  . Marital status: Married    Spouse name: Not on file  . Number of children: 3  . Years of education: Not on file  . Highest education level: Not on file  Occupational History  . Occupation: retired  Scientific laboratory technician  . Financial resource strain: Not on file  . Food insecurity    Worry:  Not on file    Inability: Not on file  . Transportation needs    Medical: Not on file    Non-medical: Not on file  Tobacco Use  . Smoking status: Former Smoker    Packs/day: 1.00    Years: 50.00    Pack years: 50.00    Types: Cigarettes    Quit date: 07/14/2010    Years since quitting: 8.8  . Smokeless tobacco: Never Used  . Tobacco comment: Counseled to remain smoke free.  Substance and Sexual Activity  . Alcohol use: No    Alcohol/week: 0.0 standard drinks  . Drug use: No  . Sexual activity: Not on file  Lifestyle  . Physical activity    Days per week: Not on file    Minutes per session: Not on file  . Stress: Not on file  Relationships  . Social Herbalist on phone: Not on file    Gets together: Not on file    Attends religious service: Not on file    Active member of club or organization: Not on file    Attends meetings of clubs or organizations: Not on file    Relationship status: Not on file  Other Topics Concern  . Not on file  Social  History Narrative  . Not on file     Family History: The patient's family history includes Heart disease in his father and mother; Throat cancer in his brother.  ROS:   Please see the history of present illness.    His feet feel numb.  All other systems reviewed and are negative.  EKGs/Labs/Other Studies Reviewed:    The following studies were reviewed today: Vascular Doppler study performed July 2019: Final Interpretation: Right: Muliple areas of 30-49% stenosis noted in the mid superficial femoral artery.  Left: 50-74% stenosis noted in the mid superficial femoral artery, high end range.  See table(s) above for measurements and observations. Vascular consult recommended.  EKG:  EKG normal sinus rhythm, right bundle branch block, rightward axis.  When compared to prior tracing from July 2019, no change has occurred.  Recent Labs: No results found for requested labs within last 8760 hours.  Recent Lipid  Panel    Component Value Date/Time   CHOL  07/10/2007 0345    189        ATP III CLASSIFICATION:  <200     mg/dL   Desirable  200-239  mg/dL   Borderline High  >=240    mg/dL   High   TRIG 105 07/10/2007 0345   HDL 18 (L) 07/10/2007 0345   CHOLHDL 10.5 07/10/2007 0345   VLDL 21 07/10/2007 0345   LDLCALC (H) 07/10/2007 0345    150        Total Cholesterol/HDL:CHD Risk Coronary Heart Disease Risk Table                     Men   Women  1/2 Average Risk   3.4   3.3    Physical Exam:    VS:  BP (!) 154/88   Pulse (!) 58   Ht 6\' 2"  (1.88 m)   Wt 212 lb 4.8 oz (96.3 kg)   SpO2 96%   BMI 27.26 kg/m     Wt Readings from Last 3 Encounters:  06/03/19 212 lb 4.8 oz (96.3 kg)  02/02/18 231 lb 9.6 oz (105.1 kg)  12/11/16 235 lb 3.7 oz (106.7 kg)     GEN: Slender and relatively healthy appearing. No acute distress HEENT: Normal NECK: No JVD. LYMPHATICS: No lymphadenopathy CARDIAC:  RRR without murmur, gallop, or edema. VASCULAR:  Normal Pulses. No bruits. RESPIRATORY:  Clear to auscultation without rales, wheezing or rhonchi  ABDOMEN: Soft, non-tender, non-distended, No pulsatile mass, MUSCULOSKELETAL: No deformity  SKIN: Warm and dry NEUROLOGIC:  Alert and oriented x 3 PSYCHIATRIC:  Normal affect   ASSESSMENT:    1. CAD in native artery   2. Essential hypertension   3. Hyperlipidemia, unspecified hyperlipidemia type   4. PAD (peripheral artery disease) ([place])   5. Educated about COVID-19 virus infection    PLAN:    In order of problems listed above:  1. Secondary prevention discussed.  Blood pressure is too high.  No lab work in 12 months. 2. Target blood pressure 130/80 mmHg or less.  Increase losartan to 100 mg/day.  May need low-dose diuretic added.  2-week follow-up in hypertension clinic.  Basic metabolic panel 3. Last lipid panel revealed an LDL of 45 in December 2019.  A lipid panel will be done in the first week and December when he comes for blood pressure  recheck. 4. Has documented PAD but does not experience claudication.  As mentioned earlier in this note he has significant aerobic activity this  morning chasing one of his dogs through the neighborhood before coming to this office visit. 5. 3W's is being practiced to avoid COVID-19.  Overall education and awareness concerning primary/secondary risk prevention was discussed in detail: LDL less than 70, hemoglobin A1c less than 7, blood pressure target less than 130/80 mmHg, >150 minutes of moderate aerobic activity per week, avoidance of smoking, weight control (via diet and exercise), and continued surveillance/management of/for obstructive sleep apnea.    Medication Adjustments/Labs and Tests Ordered: Current medicines are reviewed at length with the patient today.  Concerns regarding medicines are outlined above.  No orders of the defined types were placed in this encounter.  No orders of the defined types were placed in this encounter.   Patient Instructions  Medication Instructions:  1) TAKE Losartan 100mg  once daily  *If you need a refill on your cardiac medications before your next appointment, please call your pharmacy*  Lab Work: None If you have labs (blood work) drawn today and your tests are completely normal, you will receive your results only by: Marland Kitchen MyChart Message (if you have MyChart) OR . A paper copy in the mail If you have any lab test that is abnormal or we need to change your treatment, we will call you to review the results.  Testing/Procedures: None  Follow-Up:  Your physician recommends that you schedule a follow-up appointment in: 2 weeks with Hypertension Clinic.   At [facility], you and your health needs are our priority.  As part of our continuing mission to provide you with exceptional heart care, we have created designated Provider Care Teams.  These Care Teams include your primary Cardiologist (physician) and Advanced Practice Providers (APPs -   Physician Assistants and Nurse Practitioners) who all work together to provide you with the care you need, when you need it.  Your next appointment:   12 month(s)  The format for your next appointment:   In Person  Provider:   You may see Samuel Grooms, MD or one of the following Advanced Practice Providers on your designated Care Team:    Truitt Merle, NP  Cecilie Kicks, NP  Kathyrn Drown, NP   Other Instructions      Signed, Samuel Grooms, MD  06/03/2019 8:53 AM    [place]

## 2019-06-07 DIAGNOSIS — S0101XA Laceration without foreign body of scalp, initial encounter: Secondary | ICD-10-CM | POA: Diagnosis not present

## 2019-06-14 DIAGNOSIS — S0101XD Laceration without foreign body of scalp, subsequent encounter: Secondary | ICD-10-CM | POA: Diagnosis not present

## 2019-06-15 ENCOUNTER — Inpatient Hospital Stay: Admission: RE | Admit: 2019-06-15 | Payer: Medicare HMO | Source: Ambulatory Visit

## 2019-06-17 ENCOUNTER — Other Ambulatory Visit: Payer: Self-pay

## 2019-06-17 ENCOUNTER — Other Ambulatory Visit: Payer: Medicare HMO | Admitting: *Deleted

## 2019-06-17 ENCOUNTER — Other Ambulatory Visit: Payer: Medicare HMO

## 2019-06-17 ENCOUNTER — Ambulatory Visit (INDEPENDENT_AMBULATORY_CARE_PROVIDER_SITE_OTHER)
Admission: RE | Admit: 2019-06-17 | Discharge: 2019-06-17 | Disposition: A | Discharge status: Home / Self Care | Payer: Medicare HMO | Source: Ambulatory Visit | Attending: Acute Care | Admitting: Acute Care

## 2019-06-17 ENCOUNTER — Ambulatory Visit (INDEPENDENT_AMBULATORY_CARE_PROVIDER_SITE_OTHER): Payer: Medicare HMO | Admitting: Pharmacist

## 2019-06-17 VITALS — BP 162/86 | HR 63

## 2019-06-17 DIAGNOSIS — E785 Hyperlipidemia, unspecified: Secondary | ICD-10-CM | POA: Diagnosis not present

## 2019-06-17 DIAGNOSIS — I10 Essential (primary) hypertension: Secondary | ICD-10-CM

## 2019-06-17 DIAGNOSIS — I2510 Atherosclerotic heart disease of native coronary artery without angina pectoris: Secondary | ICD-10-CM | POA: Diagnosis not present

## 2019-06-17 DIAGNOSIS — Z87891 Personal history of nicotine dependence: Secondary | ICD-10-CM

## 2019-06-17 DIAGNOSIS — Z122 Encounter for screening for malignant neoplasm of respiratory organs: Secondary | ICD-10-CM

## 2019-06-17 LAB — BASIC METABOLIC PANEL
BUN/Creatinine Ratio: 13 (ref 10–24)
BUN: 16 mg/dL (ref 8–27)
CO2: 23 mmol/L (ref 20–29)
Calcium: 9.5 mg/dL (ref 8.6–10.2)
Chloride: 104 mmol/L (ref 96–106)
Creatinine, Ser: 1.19 mg/dL (ref 0.76–1.27)
GFR calc Af Amer: 71 mL/min/{1.73_m2} (ref >59)
GFR calc non Af Amer: 61 mL/min/{1.73_m2} (ref >59)
Glucose: 103 mg/dL — ABNORMAL HIGH (ref 65–99)
Potassium: 4.2 mmol/L (ref 3.5–5.2)
Sodium: 141 mmol/L (ref 134–144)

## 2019-06-17 LAB — LIPID PANEL
Chol/HDL Ratio: 3.1 ratio (ref 0.0–5.0)
Cholesterol, Total: 93 mg/dL — ABNORMAL LOW (ref 100–199)
HDL: 30 mg/dL — ABNORMAL LOW (ref >39)
LDL Chol Calc (NIH): 47 mg/dL (ref 0–99)
Triglycerides: 79 mg/dL (ref 0–149)
VLDL Cholesterol Cal: 16 mg/dL (ref 5–40)

## 2019-06-17 LAB — HEPATIC FUNCTION PANEL
ALT: 32 IU/L (ref 0–44)
AST: 25 IU/L (ref 0–40)
Albumin: 4.5 g/dL (ref 3.7–4.7)
Alkaline Phosphatase: 71 IU/L (ref 39–117)
Bilirubin Total: 1.2 mg/dL (ref 0.0–1.2)
Bilirubin, Direct: 0.33 mg/dL (ref 0.00–0.40)
Total Protein: 7 g/dL (ref 6.0–8.5)

## 2019-06-17 NOTE — Progress Notes (Addendum)
Patient ID: Samuel Watkins                 DOB: 05/28/1946                      MRN: [id number]     HPI: Samuel Watkins is a 73 y.o. male referred by Dr. Tamala Watkins to HTN clinic. PMH is significant for CAD s/p BMS to RCA in 2001, DES to RCA in 2008, and DES to LAD in 2008, PAD, HTN, HLD, and Hepatitis B. He receives his medications from the [facility] in [place] (sees Dr Samuel Watkins). At last visit with Dr Samuel Watkins 2 weeks ago, BP was elevated at 154/88 and losartan dose was increased to 100mg  daily. He presents today for BP follow up and lab work.  Pt presents today in good spirits. He reports tolerating his higher dose of losartan well. He was hit in the head by a branch last week and had stitches - took Advil frequently last week to help with the pain. He's feeling better this week, does not typically use NSAIDs frequently. He does drink ~4 cups of caffeinated coffee per day.   Pt gets all of his medications filled at the [facility] in [place]: Phone # [phone] Medication refill [phone] Fax [phone]  Current HTN meds: losartan 100mg  daily (AM)  BP goal: <130/24mmHg  Family History: The patient's family history includes Heart disease in his father and mother; Throat cancer in his brother.   Social History: Former smoker 1 PPD for 50 years, quit in 2012. Denies alcohol and tobacco use.  Diet: Not eating fried food, cut back on bread and sugar. States he has lost 12 lbs. Drinks 2-6 cups of coffee a day - black, caffeinated. 1 cup of tea a day. 2 cans of soup a week - does look for low sodium version. Likes pickles and sausage. Does not add salt to food.  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  Home BP readings: Does not own a BP cuff  Wt Readings from Last 3 Encounters:  06/03/19 212 lb 4.8 oz (96.3 kg)  02/02/18 231 lb 9.6 oz (105.1 kg)  12/11/16 235 lb 3.7 oz (106.7 kg)   BP Readings from Last 3 Encounters:  06/03/19 (!) 154/88  02/02/18 140/86  11/12/16 (!) 144/80    Pulse Readings from Last 3 Encounters:  06/03/19 (!) 58  02/02/18 61  11/12/16 (!) 58    Renal function: CrCl cannot be calculated (Patient's most recent lab result is older than the maximum 21 days allowed.).  Past Medical History:  Diagnosis Date  . CAD (coronary artery disease)   . Chronic hepatitis B ([place])   . Hyperlipemia   . Hypertension     Current Outpatient Medications on File Prior to Visit  Medication Sig Dispense Refill  . albuterol (PROVENTIL HFA;VENTOLIN HFA) 108 (90 BASE) MCG/ACT inhaler Inhale 2 puffs into the lungs 4 (four) times daily as needed for wheezing or shortness of breath.    Marland Kitchen aspirin EC 81 MG tablet Take 1 tablet (81 mg total) by mouth daily. 90 tablet 3  . atorvastatin (LIPITOR) 80 MG tablet Take 80 mg by mouth daily.      . budesonide-formoterol (SYMBICORT) 160-4.5 MCG/ACT inhaler Inhale 2 puffs into the lungs 2 (two) times daily.    . Cholecalciferol (VITAMIN D3) 5000 units CAPS Take 5,000 Units by mouth daily.    Marland Kitchen glimepiride (AMARYL) 2 MG tablet Take 2 mg by  mouth daily with breakfast.    . losartan (COZAAR) 100 MG tablet Take 100 mg by mouth daily.    . Magnesium Oxide 420 MG TABS Take 2 tablets by mouth daily. EXCEPT Sundays    . metFORMIN (GLUCOPHAGE) 500 MG tablet Take 1,000 mg by mouth 2 (two) times daily with a meal.     . niacin 500 MG tablet Take 1,000 mg by mouth at bedtime.    . nitroGLYCERIN (NITROSTAT) 0.4 MG SL tablet Place 0.4 mg under the tongue every 5 (five) minutes x 3 doses as needed for chest pain (Call 911 at 3rd dose within 15 minutes.).    Marland Kitchen pantoprazole (PROTONIX) 40 MG tablet Take 40 mg by mouth daily.    . Tiotropium Bromide-Olodaterol (STIOLTO RESPIMAT) 2.5-2.5 MCG/ACT AERS Inhale 2 puffs into the lungs daily.     No current facility-administered medications on file prior to visit.     No Known Allergies   Assessment/Plan:  1. Hypertension - BP above goal <130/55mmHg. Will start amlodipine 5mg  daily and continue  losartan 100mg  daily. Pt encouraged to limit daily sodium intake to < 2,000mg  and cut back on coffee intake each day. 90 day rx faxed to [facility] for both losartan and amlodipine. Pt will look into purchasing a BP cuff so he can monitor his readings at home. Follow up in HTN clinic in 4 weeks (pt states it will take ~10 days for meds to come in from [place]).  Megan E. Supple, PharmD, BCACP, [place] [id number] [address] [place],  [address] Phone: [phone]; Fax: [phone] 06/17/2019 11:07 AM

## 2019-06-17 NOTE — Patient Instructions (Addendum)
It was nice to meet you today!  Your blood pressure goal is < 130/80mmHg  Start taking amlodipine 5mg  - 1 tablet once a day  Continue taking losartan 100mg  daily  Try to limit your sodium intake to less than 2,000mg  per day  Call Megan, Pharmacist at [phone] with your [address] phone and fax numbers so I can send your prescriptions in  Follow up for a blood pressure check on Monday, January 4th at 10:30am

## 2019-06-22 ENCOUNTER — Telehealth: Payer: Self-pay | Admitting: Acute Care

## 2019-06-22 DIAGNOSIS — Z87891 Personal history of nicotine dependence: Secondary | ICD-10-CM

## 2019-06-22 DIAGNOSIS — Z122 Encounter for screening for malignant neoplasm of respiratory organs: Secondary | ICD-10-CM

## 2019-06-22 NOTE — Telephone Encounter (Signed)
Pt informed of CT results per Sarah Groce, NP.  PT verbalized understanding.  Copy sent to PCP.  Order placed for 1 yr f/u CT.  

## 2019-06-22 NOTE — Telephone Encounter (Signed)
Samuel Watkins can you call pt back about CT results.

## 2019-07-04 DIAGNOSIS — J439 Emphysema, unspecified: Secondary | ICD-10-CM | POA: Diagnosis not present

## 2019-07-04 DIAGNOSIS — D485 Neoplasm of uncertain behavior of skin: Secondary | ICD-10-CM | POA: Diagnosis not present

## 2019-07-04 DIAGNOSIS — Z7984 Long term (current) use of oral hypoglycemic drugs: Secondary | ICD-10-CM | POA: Diagnosis not present

## 2019-07-04 DIAGNOSIS — E1149 Type 2 diabetes mellitus with other diabetic neurological complication: Secondary | ICD-10-CM | POA: Diagnosis not present

## 2019-07-04 DIAGNOSIS — G629 Polyneuropathy, unspecified: Secondary | ICD-10-CM | POA: Diagnosis not present

## 2019-07-18 ENCOUNTER — Other Ambulatory Visit: Payer: Self-pay

## 2019-07-18 ENCOUNTER — Ambulatory Visit (INDEPENDENT_AMBULATORY_CARE_PROVIDER_SITE_OTHER): Payer: Medicare HMO | Admitting: Pharmacist

## 2019-07-18 VITALS — BP 140/80 | HR 62

## 2019-07-18 DIAGNOSIS — I10 Essential (primary) hypertension: Secondary | ICD-10-CM

## 2019-07-18 MED ORDER — LOSARTAN POTASSIUM 100 MG PO TABS: 100.0000 mg | ORAL_TABLET | Freq: Every day | ORAL | 3 refills | Status: DC

## 2019-07-18 MED ORDER — LOSARTAN POTASSIUM 100 MG PO TABS: 100.0000 mg | ORAL_TABLET | Freq: Every day | ORAL | 0 refills | Status: DC

## 2019-07-18 NOTE — Progress Notes (Signed)
Patient ID: Samuel Watkins                 DOB: 22-Aug-1945                      MRN: [id number]     HPI: Samuel Watkins is a 74 y.o. male referred by Dr. Tamala Julian to HTN clinic. PMH is significant for CAD s/p BMS to RCA in 2001, DES to RCA in 2008, and DES to LAD in 2008, PAD, HTN, HLD, and Hepatitis B. He receives his medications from the [facility] in [place] (sees Dr Duke Salvia). At last visit in HTN clinic 4 weeks ago, BP was elevated at 162/86. Pt was started on amlodipine 5mg  daily, encouraged to limit daily sodium intake to < 2,000mg , and cut back on coffee intake each day.   Pt presents today in good spirits. He has changed all of his coffee intake to decaf and has tried to limit his salt intake. He did purchase a home BP cuff - reports systolic BP has ranged [id number]. Checks on his right arm right before he's due for his medication doses. Denies headache, blurred vision, dizziness, falls, or LEE. He is tolerating his amlodipine well but rx didn't arrive in the mail until last week. He took both amlodipine and losartan this AM at 7:30am. Doesn't use NSAIDs frequently.  Home BP cuff: 176/81 right arm, 140/80 left arm Clinic cuff: 140/80 right arm, 140/78 left arm  Pt gets all of his medications filled at the [facility] in [place]: Phone # [phone] Medication refill [phone] Fax [phone]  Current HTN meds: losartan 100mg  daily (AM), amlodipine 5mg  daily   BP goal: <130/67mmHg  Family History: The patient's family history includes Heart disease in his father and mother; Throat cancer in his brother.   Social History: Former smoker 1 PPD for 50 years, quit in 2012. Denies alcohol and tobacco use.  Diet: Not eating fried food, cut back on bread and sugar. States he has lost 12 lbs. Drinks 2-6 cups of coffee a day - black, caffeinated. 1 cup of tea a day. 2 cans of soup a week - does look for low sodium version. Likes pickles and sausage. Does not add salt to food.   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  Home BP readings: Recently purchased BP cuff after last visit - systolic readings ranging 160-200s.  Wt Readings from Last 3 Encounters:  06/03/19 212 lb 4.8 oz (96.3 kg)  02/02/18 231 lb 9.6 oz (105.1 kg)  12/11/16 235 lb 3.7 oz (106.7 kg)   BP Readings from Last 3 Encounters:  06/17/19 (!) 162/86  06/03/19 (!) 154/88  02/02/18 140/86   Pulse Readings from Last 3 Encounters:  06/17/19 63  06/03/19 (!) 58  02/02/18 61    Renal function: CrCl cannot be calculated (Patient's most recent lab result is older than the maximum 21 days allowed.).  Past Medical History:  Diagnosis Date  . CAD (coronary artery disease)   . Chronic hepatitis B ([place])   . Hyperlipemia   . Hypertension     Current Outpatient Medications on File Prior to Visit  Medication Sig Dispense Refill  . albuterol (PROVENTIL HFA;VENTOLIN HFA) 108 (90 BASE) MCG/ACT inhaler Inhale 2 puffs into the lungs 4 (four) times daily as needed for wheezing or shortness of breath.    Marland Kitchen amLODipine (NORVASC) 5 MG tablet Take 5 mg by mouth daily.    Marland Kitchen aspirin EC 81  MG tablet Take 1 tablet (81 mg total) by mouth daily. 90 tablet 3  . atorvastatin (LIPITOR) 80 MG tablet Take 80 mg by mouth daily.      . budesonide-formoterol (SYMBICORT) 160-4.5 MCG/ACT inhaler Inhale 2 puffs into the lungs 2 (two) times daily.    . Cholecalciferol (VITAMIN D3) 5000 units CAPS Take 5,000 Units by mouth daily.    Marland Kitchen glimepiride (AMARYL) 2 MG tablet Take 2 mg by mouth daily with breakfast.    . losartan (COZAAR) 100 MG tablet Take 100 mg by mouth daily.    . Magnesium Oxide 420 MG TABS Take 2 tablets by mouth daily. EXCEPT Sundays    . metFORMIN (GLUCOPHAGE) 500 MG tablet Take 1,000 mg by mouth 2 (two) times daily with a meal.     . niacin 500 MG tablet Take 1,000 mg by mouth at bedtime.    . nitroGLYCERIN (NITROSTAT) 0.4 MG SL tablet Place 0.4 mg under the tongue every 5 (five) minutes x 3 doses as needed for chest  pain (Call 911 at 3rd dose within 15 minutes.).    Marland Kitchen pantoprazole (PROTONIX) 40 MG tablet Take 40 mg by mouth daily.    . Tiotropium Bromide-Olodaterol (STIOLTO RESPIMAT) 2.5-2.5 MCG/ACT AERS Inhale 2 puffs into the lungs daily.     No current facility-administered medications on file prior to visit.    No Known Allergies   Assessment/Plan:  1. Hypertension - BP above goal <130/89mmHg however has improved notably from his last visit. Pt prefers to continue on current meds since his amlodipine was not delivered until last week. Will continue amlodipine 5mg  daily and continue losartan 100mg  daily and will call pt in 2 weeks to reassess BP readings. If they remain elevated, will increase amlodipine dosage. Also advised pt to move his amlodipine to PM for better 24 hour coverage. Advised him to check BP in left arm only as his home cuff measured more accurately in this arm, and encouraged him to check BP at least 2 hours after taking meds instead of right before.   Megan E. Supple, PharmD, BCACP, [place] [id number] [address]. [address], [place], [place] [address] Phone: [phone]; Fax: [phone] 07/18/2019 7:51 AM

## 2019-07-18 NOTE — Patient Instructions (Addendum)
It was nice to see you today  Your blood pressure goal is less than 130/87mmHg  Move your amlodipine to the evening and keep taking your losartan in the morning. I will send in a refill for the losartan 100mg  tablet to take 1 full tablet each day  Keep up the great work drinking your decaf coffee  Check your blood pressure at home 2 hours after taking your medications - take your blood pressure in your left arm  I will give you a call in 2 weeks to see how your blood pressure is doing. If your readings are still above goal, we can increase your amlodipine dose and schedule follow up at that time

## 2019-08-02 ENCOUNTER — Telehealth: Payer: Self-pay | Admitting: Pharmacist

## 2019-08-02 NOTE — Telephone Encounter (Addendum)
Called pt to follow up with BP readings since he has been on amlodipine for an additional 2 weeks now. He is taking amlodipine 5mg  in the evening and losartan 100mg  in the morning as directed. Home BP readings as follows:  167/62  164/83 135/76 143/79 157/84 154/80  Discussed that his BP goal is < 130/45mmHg. He is agreeable to increasing his dose of amlodipine to 10mg  daily. Will fax over new rx to [facility]. Advised pt to continue to monitor BP at home. Will call him in 2 weeks to follow up with BP readings. Can consider adding thiazide if needed.

## 2019-08-11 ENCOUNTER — Other Ambulatory Visit: Payer: Self-pay | Admitting: Interventional Cardiology

## 2019-08-17 ENCOUNTER — Telehealth: Payer: Self-pay | Admitting: Pharmacist

## 2019-08-17 MED ORDER — HYDROCHLOROTHIAZIDE 12.5 MG PO CAPS: 12.5000 mg | ORAL_CAPSULE | Freq: Every day | ORAL | 11 refills | Status: DC

## 2019-08-17 NOTE — Telephone Encounter (Signed)
Called pt to follow up with BP readings since increasing amlodipine from 5mg  to 10mg  daily and continuing losartan 100mg  daily.  Pt states he is not at home and prefers for me to call him later this afternoon to discuss readings.

## 2019-08-17 NOTE — Telephone Encounter (Signed)
Called pt back - home BP readings 156/79, 151/80 yesterday and today. Last week had 1 low reading of 116/68, all other systolic readings [id number].  Will add HCTZ 12.5mg  once daily, continue amlodipine 10mg  daily and losartan 100mg  daily. Scheduled pt f/u in clinic in 2 weeks for BP check and BMET. Once BP is at goal and no further BP medication adjustments are being made, will send over combo losartan-HCTZ rx to the [facility] per pt request.

## 2019-09-01 ENCOUNTER — Ambulatory Visit: Payer: Medicare HMO

## 2019-09-04 NOTE — Progress Notes (Signed)
Patient ID: [name]                 DOB: 1945-10-29                      MRN: [id number]     HPI: Samuel Watkins is a 74 y.o. male referred by Dr. Tamala Watkins to HTN clinic. PMH is significant for CAD s/p BMS to RCA in 2001, DES to RCA in 2008, and DES to LAD in 2008, PAD, HTN, HLD, and Hepatitis B. He receives his medications from the [facility] in [place] ([facility] Dr. Duke Salvia). At last visit in HTN clinic on 07/18/19, BP was elevated at 140/80 in right arm and 140/78 in left arm. Since then, amlodipine was increased to 10 mg daily and hydrochlorothiazide 12.5 mg once daily was added.  Patient presents to clinic today for follow-up. He complains of a new persistent dull headache that started this month with the recent BP medication changes. He denies any dizziness or swelling. His home BP records show recent systolic readings more at goal ranging from 115-130s with some in the 150s. Diastolic readings range in the 60-70s. He always uses his left arm for BP readings and states that he does not rest prior to measuring BP. He reports no missed doses and he took his morning BP medications today.  Pt gets all of his medications filled at the [facility] in [place]: Phone # [phone] Medication refill [phone] Fax [phone]  Current HTN meds: losartan 100mg  daily (AM), amlodipine 10mg  daily (PM), hydrochlorothiazide 12.5mg  daily  BP goal: <130/80 mmHg  Family History: The patient'sfamily history includes Heart disease in his father and mother; Throat cancer in his brother  Social History: Former smoker 1 PPD for 50 years, quit in 2012. Denies alcohol and tobacco use.  Diet: Not eating fried food, cut back on bread and sugar. States he has lost 12 lbs. Only drinks decaffeinated coffee now. 1 cup of tea a day. 1 can of soup a day - does look for low sodium version. Likes pickles and sausage. Does not add salt to food. Eats canned vegetables - rinses with water for certain vegetables (I.e. green  beans).  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  Home BP readings:   Wt Readings from Last 3 Encounters:  06/03/19 212 lb 4.8 oz (96.3 kg)  02/02/18 231 lb 9.6 oz (105.1 kg)  12/11/16 235 lb 3.7 oz (106.7 kg)   BP Readings from Last 3 Encounters:  09/05/19 124/66  07/18/19 140/80  06/17/19 (!) 162/86   Pulse Readings from Last 3 Encounters:  09/05/19 81  07/18/19 62  06/17/19 63    Renal function: CrCl cannot be calculated (Patient's most recent lab result is older than the maximum 21 days allowed.).  Past Medical History:  Diagnosis Date  . CAD (coronary artery disease)   . Chronic hepatitis B ([place])   . Hyperlipemia   . Hypertension     Current Outpatient Medications on File Prior to Visit  Medication Sig Dispense Refill  . albuterol (PROVENTIL HFA;VENTOLIN HFA) 108 (90 BASE) MCG/ACT inhaler Inhale 2 puffs into the lungs 4 (four) times daily as needed for wheezing or shortness of breath.    Marland Kitchen amLODipine (NORVASC) 10 MG tablet Take 1 tablet (10 mg total) by mouth daily. 90 tablet 3  . aspirin EC 81 MG tablet Take 1 tablet (81 mg total) by mouth daily. 90 tablet 3  . atorvastatin (LIPITOR) 80  MG tablet Take 80 mg by mouth daily.      . budesonide-formoterol (SYMBICORT) 160-4.5 MCG/ACT inhaler Inhale 2 puffs into the lungs 2 (two) times daily.    . Cholecalciferol (VITAMIN D3) 5000 units CAPS Take 5,000 Units by mouth daily.    Marland Kitchen glimepiride (AMARYL) 2 MG tablet Take 2 mg by mouth daily with breakfast.    . losartan (COZAAR) 100 MG tablet TAKE 1 TABLET BY MOUTH EVERY DAY 30 tablet 10  . Magnesium Oxide 420 MG TABS Take 2 tablets by mouth daily. EXCEPT Sundays    . metFORMIN (GLUCOPHAGE) 500 MG tablet Take 1,000 mg by mouth 2 (two) times daily with a meal.     . niacin 500 MG tablet Take 1,000 mg by mouth at bedtime.    . nitroGLYCERIN (NITROSTAT) 0.4 MG SL tablet Place 0.4 mg under the tongue every 5 (five) minutes x 3 doses as needed for chest pain (Call  911 at 3rd dose within 15 minutes.).    Marland Kitchen pantoprazole (PROTONIX) 40 MG tablet Take 40 mg by mouth daily.    . Tiotropium Bromide-Olodaterol (STIOLTO RESPIMAT) 2.5-2.5 MCG/ACT AERS Inhale 2 puffs into the lungs daily.     No current facility-administered medications on file prior to visit.    No Known Allergies   Assessment/Plan:  1. Hypertension - Blood pressure today at goal <130/80. Given his consistent headaches, will stop hydrochlorothiazide today and check BMET. Continue losartan 100 mg daily and amlodipine 10 mg daily. Discussed proper BP measuring techniques and the importance of a low-sodium diet. Will follow-up with a phone call in 2 weeks at 3 pm to review home BP readings after stopping hydrochlorothiazide.  Ramond Dial, Pharm.D, BCPS, CPP [place]  [id number], [place], [place] [address]  Phone: [phone]; Fax: [phone]

## 2019-09-05 ENCOUNTER — Other Ambulatory Visit: Payer: Self-pay

## 2019-09-05 ENCOUNTER — Other Ambulatory Visit: Payer: Self-pay | Admitting: Family Medicine

## 2019-09-05 ENCOUNTER — Ambulatory Visit (INDEPENDENT_AMBULATORY_CARE_PROVIDER_SITE_OTHER): Payer: Medicare HMO | Admitting: Pharmacist

## 2019-09-05 ENCOUNTER — Encounter: Payer: Self-pay | Admitting: Pharmacist

## 2019-09-05 VITALS — BP 124/66 | HR 81

## 2019-09-05 DIAGNOSIS — I10 Essential (primary) hypertension: Secondary | ICD-10-CM | POA: Diagnosis not present

## 2019-09-05 DIAGNOSIS — L821 Other seborrheic keratosis: Secondary | ICD-10-CM | POA: Diagnosis not present

## 2019-09-05 DIAGNOSIS — D485 Neoplasm of uncertain behavior of skin: Secondary | ICD-10-CM | POA: Diagnosis not present

## 2019-09-05 DIAGNOSIS — L723 Sebaceous cyst: Secondary | ICD-10-CM | POA: Diagnosis not present

## 2019-09-05 LAB — BASIC METABOLIC PANEL
BUN/Creatinine Ratio: 13 (ref 10–24)
BUN: 15 mg/dL (ref 8–27)
CO2: 20 mmol/L (ref 20–29)
Calcium: 9.6 mg/dL (ref 8.6–10.2)
Chloride: 104 mmol/L (ref 96–106)
Creatinine, Ser: 1.17 mg/dL (ref 0.76–1.27)
GFR calc Af Amer: 72 mL/min/{1.73_m2} (ref >59)
GFR calc non Af Amer: 62 mL/min/{1.73_m2} (ref >59)
Glucose: 131 mg/dL — ABNORMAL HIGH (ref 65–99)
Potassium: 4 mmol/L (ref 3.5–5.2)
Sodium: 143 mmol/L (ref 134–144)

## 2019-09-05 NOTE — Patient Instructions (Addendum)
It was a pleasure to meet you today.  Please STOP taking HCTZ 12.5mg  daily  I will call you in 2 weeks to see how your blood pressure is doing and see if your headache has resolved.  Call us at [phone] with any questions or concerns

## 2019-09-08 DIAGNOSIS — E782 Mixed hyperlipidemia: Secondary | ICD-10-CM | POA: Diagnosis not present

## 2019-09-08 DIAGNOSIS — I2510 Atherosclerotic heart disease of native coronary artery without angina pectoris: Secondary | ICD-10-CM | POA: Diagnosis not present

## 2019-09-08 DIAGNOSIS — J449 Chronic obstructive pulmonary disease, unspecified: Secondary | ICD-10-CM | POA: Diagnosis not present

## 2019-09-08 DIAGNOSIS — J439 Emphysema, unspecified: Secondary | ICD-10-CM | POA: Diagnosis not present

## 2019-09-08 DIAGNOSIS — I10 Essential (primary) hypertension: Secondary | ICD-10-CM | POA: Diagnosis not present

## 2019-09-08 DIAGNOSIS — E1149 Type 2 diabetes mellitus with other diabetic neurological complication: Secondary | ICD-10-CM | POA: Diagnosis not present

## 2019-09-14 ENCOUNTER — Other Ambulatory Visit: Payer: Self-pay

## 2019-09-14 ENCOUNTER — Encounter: Payer: Self-pay | Admitting: Podiatry

## 2019-09-14 ENCOUNTER — Ambulatory Visit: Payer: Medicare HMO | Admitting: Podiatry

## 2019-09-14 DIAGNOSIS — E1142 Type 2 diabetes mellitus with diabetic polyneuropathy: Secondary | ICD-10-CM

## 2019-09-14 DIAGNOSIS — E1140 Type 2 diabetes mellitus with diabetic neuropathy, unspecified: Secondary | ICD-10-CM | POA: Insufficient documentation

## 2019-09-14 NOTE — Progress Notes (Signed)
This patient presents to the office for diabetic foot exam.  This patient presents to the office due to referral from  Dr.  Harrington Challenger.  He says he experiences numbness in his toe during sleep.  He is taking medicine by mouth.  He presents for diabetic foot exam.    Podiatric Exam: Vascular: dorsalis pedis and posterior tibial pulses are weakly  palpable bilateral. Capillary return is immediate. Temperature gradient is WNL. Skin turgor WNL  Sensorium: Normal Semmes Weinstein monofilament test.except absence of toes  LOPS.   Normal tactile sensation bilaterally. Nail Exam: Pt has thick disfigured discolored nails with subungual debris hallux nails  B/L. Ulcer Exam: There is no evidence of ulcer or pre-ulcerative changes or infection. Orthopedic Exam: Muscle tone and strength are WNL. No limitations in general ROM. No crepitus or effusions noted. Foot type and digits show no abnormalities. Bony prominences are unremarkable. Skin: No Porokeratosis. No infection or ulcers  Diagnosis:  Diabetic neuropathy.  Treatment & Plan Procedures and Treatment: Consent by patient was obtained for treatment procedures.   Patient has vascular pathology and LOPS pathology in toes  B/L. No ulceration, no infection noted.  Return Visit-Office Procedure: Patient instructed to return to the office for a follow up visit 3 months for continued evaluation and treatment.    Gardiner Barefoot DPM

## 2019-09-29 ENCOUNTER — Telehealth: Payer: Self-pay | Admitting: Pharmacist

## 2019-09-29 NOTE — Telephone Encounter (Signed)
Spoke with patient about his blood pressures since stopping HCTZ. States last week his highest systolic was [id number] and lowest was 131. 2 weeks ago his highest was 132 and lowest [id number] systolic. Diastolic was about 0000000. Patient states that his headache is not any better. He states that its "not really a headache, its just something that's there that naggs" I advised he speak with his PCP about this. Will resume HCTZ 12.5mg  daily since blood pressure was much better on this and his headaches have not improved. Will follow up in clinic in 1 month

## 2019-10-10 DIAGNOSIS — I2510 Atherosclerotic heart disease of native coronary artery without angina pectoris: Secondary | ICD-10-CM | POA: Diagnosis not present

## 2019-10-10 DIAGNOSIS — N183 Chronic kidney disease, stage 3 unspecified: Secondary | ICD-10-CM | POA: Diagnosis not present

## 2019-10-10 DIAGNOSIS — E1149 Type 2 diabetes mellitus with other diabetic neurological complication: Secondary | ICD-10-CM | POA: Diagnosis not present

## 2019-10-10 DIAGNOSIS — J439 Emphysema, unspecified: Secondary | ICD-10-CM | POA: Diagnosis not present

## 2019-10-10 DIAGNOSIS — J449 Chronic obstructive pulmonary disease, unspecified: Secondary | ICD-10-CM | POA: Diagnosis not present

## 2019-10-10 DIAGNOSIS — E782 Mixed hyperlipidemia: Secondary | ICD-10-CM | POA: Diagnosis not present

## 2019-10-10 DIAGNOSIS — I10 Essential (primary) hypertension: Secondary | ICD-10-CM | POA: Diagnosis not present

## 2019-10-31 ENCOUNTER — Other Ambulatory Visit: Payer: Self-pay

## 2019-10-31 ENCOUNTER — Ambulatory Visit (INDEPENDENT_AMBULATORY_CARE_PROVIDER_SITE_OTHER): Payer: Medicare HMO | Admitting: Pharmacist

## 2019-10-31 VITALS — BP 132/68 | HR 61

## 2019-10-31 DIAGNOSIS — I10 Essential (primary) hypertension: Secondary | ICD-10-CM | POA: Diagnosis not present

## 2019-10-31 MED ORDER — IRBESARTAN 300 MG PO TABS: 300.0000 mg | ORAL_TABLET | Freq: Every day | ORAL | 3 refills | Status: DC

## 2019-10-31 NOTE — Progress Notes (Signed)
Patient ID: [name]                 DOB: [birth year]                      MRN: [id number]     HPI: Samuel Watkins is a 74 y.o. male referred by Dr. Tamala Watkins to HTN clinic. PMH is significant for CAD s/p BMS to RCA in 2001, DES to RCA in 2008, and DES to LAD in 2008, PAD, HTN, HLD, and Hepatitis B. He receives his medications from the [facility] in [place] ([facility] Dr. Duke Watkins).  At last visit patient complained of a headache thought to be caused by HCTZ, therefore HCTZ was stopped. On follow up via telephone patient reports still having a "something nagging" in his head. He was adivsed to follow up with PCP about this. HCTZ 12.5mg  was resumed since BP had been high.  Patient presents to clinic today for follow-up. He states that he stopped taking the HCTZ again and his headache went away. He brings very good records of his blood pressure. About 50% of the readings are at goal or very close to goal. No reading above [id number] systolic. He denies dizziness, lightheadedness, headache, blurred vision or swelling. No SOB out of the ordinary (has COPD).   Pt gets all of his medications filled at the [facility] in [place]: Phone # [phone] Medication refill [phone] Fax [phone]  Current HTN meds: losartan 100mg  daily (AM), amlodipine 10mg  daily (PM)  BP goal: <130/80 mmHg  Family History: The patient'sfamily history includes Heart disease in his father and mother; Throat cancer in his brother  Social History: Former smoker 1 PPD for 50 years, quit in 2012. Denies alcohol and tobacco use.  Diet: Not eating fried food, cut back on bread and sugar. States he has lost 12 lbs. Only drinks decaffeinated coffee now. 1 can of soup a day - does look for low sodium version. Likes pickles and sausage. Does not add salt to food. Eats canned vegetables - rinses with water for certain vegetables (I.e. green beans).  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  Home BP readings: 138/77, 140/75,  124/76, 132/76, 127/76, 137/73, 119/74, 132/70, 124/72, 127/68, 133/76, 125/69, 131/70, 142/78, 137/72, 127/69, 145/78, 139/75, 131/78, 132/77  Wt Readings from Last 3 Encounters:  06/03/19 212 lb 4.8 oz (96.3 kg)  02/02/18 231 lb 9.6 oz (105.1 kg)  12/11/16 235 lb 3.7 oz (106.7 kg)   BP Readings from Last 3 Encounters:  09/05/19 124/66  07/18/19 140/80  06/17/19 (!) 162/86   Pulse Readings from Last 3 Encounters:  09/05/19 81  07/18/19 62  06/17/19 63    Renal function: CrCl cannot be calculated (Patient's most recent lab result is older than the maximum 21 days allowed.).  Past Medical History:  Diagnosis Date  . CAD (coronary artery disease)   . Chronic hepatitis B ([place])   . Hyperlipemia   . Hypertension     Current Outpatient Medications on File Prior to Visit  Medication Sig Dispense Refill  . albuterol (PROVENTIL HFA;VENTOLIN HFA) 108 (90 BASE) MCG/ACT inhaler Inhale 2 puffs into the lungs 4 (four) times daily as needed for wheezing or shortness of breath.    Marland Kitchen amLODipine (NORVASC) 10 MG tablet Take 1 tablet (10 mg total) by mouth daily. 90 tablet 3  . aspirin EC 81 MG tablet Take 1 tablet (81 mg total) by mouth daily. 90 tablet 3  . atorvastatin (  LIPITOR) 80 MG tablet Take 80 mg by mouth daily.      . budesonide-formoterol (SYMBICORT) 160-4.5 MCG/ACT inhaler Inhale 2 puffs into the lungs 2 (two) times daily.    . Cholecalciferol (VITAMIN D3) 5000 units CAPS Take 5,000 Units by mouth daily.    Marland Kitchen glimepiride (AMARYL) 2 MG tablet Take 2 mg by mouth daily with breakfast.    . losartan (COZAAR) 100 MG tablet TAKE 1 TABLET BY MOUTH EVERY DAY 30 tablet 10  . Magnesium Oxide 420 MG TABS Take 2 tablets by mouth daily. EXCEPT Sundays    . metFORMIN (GLUCOPHAGE) 500 MG tablet Take 1,000 mg by mouth 2 (two) times daily with a meal.     . niacin 500 MG tablet Take 1,000 mg by mouth at bedtime.    . nitroGLYCERIN (NITROSTAT) 0.4 MG SL tablet Place 0.4 mg under the tongue every 5  (five) minutes x 3 doses as needed for chest pain (Call 911 at 3rd dose within 15 minutes.).    Marland Kitchen pantoprazole (PROTONIX) 40 MG tablet Take 40 mg by mouth daily.    . Tiotropium Bromide-Olodaterol (STIOLTO RESPIMAT) 2.5-2.5 MCG/ACT AERS Inhale 2 puffs into the lungs daily.     No current facility-administered medications on file prior to visit.    No Known Allergies   Assessment/Plan:  1. Hypertension - Blood pressure is slightly above goal in clinic today. Will try switching ARB to a stronger ARB. Will stop losartan and start irbesartan 300mg  daily. I have faxed rx over to [facility]. Patient to call if there is any issues. I will call patient in 4 weeks to follow up on blood pressure. Will have patient get repeat BMP at that time. He brings in lab work from march with a stable scr of 1.3 and K of 4.2.  Samuel Watkins, Pharm.D, BCPS, CPP [place]  [id number] [address] [place], [place] [address]  Phone: [phone]; Fax: [phone]

## 2019-10-31 NOTE — Patient Instructions (Signed)
It was nice to see you again today!  Please STOP taking losartan  START taking irbesartan 300mg  daily Continue taking amlodipine 10mg  daily  I will call you in 4 weeks to see how your blood pressure is doing.  Feel free to call us at [phone] with any questions or concerns  Continue checking your blood pressure once a day.

## 2019-11-22 ENCOUNTER — Telehealth: Payer: Self-pay | Admitting: Pharmacist

## 2019-11-22 NOTE — Telephone Encounter (Signed)
Patient called stating that the new medication, irbesartan is giving him a headache and making his eyes hurt. Just started taking on the 5th. I advised he try taking 1/2 of the tablet to see if that helps. Patient agreeable. Will call patient in 4 weeks to see how BP is doing.

## 2019-11-22 NOTE — Addendum Note (Signed)
Addended by: Marcelle Overlie D on: 11/22/2019 04:05 PM   Modules accepted: Orders

## 2019-12-20 ENCOUNTER — Ambulatory Visit: Payer: Medicare HMO | Admitting: Podiatry

## 2019-12-29 DIAGNOSIS — J449 Chronic obstructive pulmonary disease, unspecified: Secondary | ICD-10-CM | POA: Diagnosis not present

## 2019-12-29 DIAGNOSIS — I2510 Atherosclerotic heart disease of native coronary artery without angina pectoris: Secondary | ICD-10-CM | POA: Diagnosis not present

## 2019-12-29 DIAGNOSIS — E782 Mixed hyperlipidemia: Secondary | ICD-10-CM | POA: Diagnosis not present

## 2019-12-29 DIAGNOSIS — J439 Emphysema, unspecified: Secondary | ICD-10-CM | POA: Diagnosis not present

## 2019-12-29 DIAGNOSIS — N183 Chronic kidney disease, stage 3 unspecified: Secondary | ICD-10-CM | POA: Diagnosis not present

## 2019-12-29 DIAGNOSIS — I10 Essential (primary) hypertension: Secondary | ICD-10-CM | POA: Diagnosis not present

## 2019-12-29 DIAGNOSIS — E1149 Type 2 diabetes mellitus with other diabetic neurological complication: Secondary | ICD-10-CM | POA: Diagnosis not present

## 2020-03-22 DIAGNOSIS — M25559 Pain in unspecified hip: Secondary | ICD-10-CM | POA: Diagnosis not present

## 2020-03-22 DIAGNOSIS — I739 Peripheral vascular disease, unspecified: Secondary | ICD-10-CM | POA: Diagnosis not present

## 2020-03-22 DIAGNOSIS — E1149 Type 2 diabetes mellitus with other diabetic neurological complication: Secondary | ICD-10-CM | POA: Diagnosis not present

## 2020-03-22 DIAGNOSIS — Z23 Encounter for immunization: Secondary | ICD-10-CM | POA: Diagnosis not present

## 2020-04-10 DIAGNOSIS — I10 Essential (primary) hypertension: Secondary | ICD-10-CM | POA: Diagnosis not present

## 2020-04-10 DIAGNOSIS — J449 Chronic obstructive pulmonary disease, unspecified: Secondary | ICD-10-CM | POA: Diagnosis not present

## 2020-04-10 DIAGNOSIS — N183 Chronic kidney disease, stage 3 unspecified: Secondary | ICD-10-CM | POA: Diagnosis not present

## 2020-04-10 DIAGNOSIS — I2510 Atherosclerotic heart disease of native coronary artery without angina pectoris: Secondary | ICD-10-CM | POA: Diagnosis not present

## 2020-04-10 DIAGNOSIS — E782 Mixed hyperlipidemia: Secondary | ICD-10-CM | POA: Diagnosis not present

## 2020-04-10 DIAGNOSIS — J439 Emphysema, unspecified: Secondary | ICD-10-CM | POA: Diagnosis not present

## 2020-04-10 DIAGNOSIS — E1149 Type 2 diabetes mellitus with other diabetic neurological complication: Secondary | ICD-10-CM | POA: Diagnosis not present

## 2020-04-19 DIAGNOSIS — Z03818 Encounter for observation for suspected exposure to other biological agents ruled out: Secondary | ICD-10-CM | POA: Diagnosis not present

## 2020-04-19 DIAGNOSIS — J329 Chronic sinusitis, unspecified: Secondary | ICD-10-CM | POA: Diagnosis not present

## 2020-04-19 DIAGNOSIS — R0981 Nasal congestion: Secondary | ICD-10-CM | POA: Diagnosis not present

## 2020-04-19 DIAGNOSIS — R059 Cough, unspecified: Secondary | ICD-10-CM | POA: Diagnosis not present

## 2020-04-19 DIAGNOSIS — Z8709 Personal history of other diseases of the respiratory system: Secondary | ICD-10-CM | POA: Diagnosis not present

## 2020-04-25 ENCOUNTER — Other Ambulatory Visit: Payer: Self-pay | Admitting: *Deleted

## 2020-04-25 DIAGNOSIS — M25569 Pain in unspecified knee: Secondary | ICD-10-CM

## 2020-05-15 ENCOUNTER — Ambulatory Visit: Payer: Medicare HMO | Attending: Internal Medicine

## 2020-05-15 ENCOUNTER — Other Ambulatory Visit (HOSPITAL_BASED_OUTPATIENT_CLINIC_OR_DEPARTMENT_OTHER): Payer: Self-pay | Admitting: Internal Medicine

## 2020-05-15 DIAGNOSIS — Z23 Encounter for immunization: Secondary | ICD-10-CM

## 2020-05-15 NOTE — Progress Notes (Signed)
   Covid-19 Vaccination Clinic  Name:  JENNINGS CORADO    MRN: [id number] DOB: 1946/03/04  05/15/2020  Mr. Rabenold was observed post Covid-19 immunization for 15 minutes without incident. He was provided with Vaccine Information Sheet and instruction to access the V-Safe system.   Mr. Coster was instructed to call 911 with any severe reactions post vaccine: Marland Kitchen Difficulty breathing  . Swelling of face and throat  . A fast heartbeat  . A bad rash all over body  . Dizziness and weakness

## 2020-05-24 MED FILL — PFIZER-BIONTECH COVID-19 VA: 30 | 1 days supply | Qty: 0 | Fill #0

## 2020-05-28 DIAGNOSIS — K219 Gastro-esophageal reflux disease without esophagitis: Secondary | ICD-10-CM | POA: Diagnosis not present

## 2020-05-28 DIAGNOSIS — E1149 Type 2 diabetes mellitus with other diabetic neurological complication: Secondary | ICD-10-CM | POA: Diagnosis not present

## 2020-05-28 DIAGNOSIS — I2510 Atherosclerotic heart disease of native coronary artery without angina pectoris: Secondary | ICD-10-CM | POA: Diagnosis not present

## 2020-05-28 DIAGNOSIS — J449 Chronic obstructive pulmonary disease, unspecified: Secondary | ICD-10-CM | POA: Diagnosis not present

## 2020-05-28 DIAGNOSIS — J439 Emphysema, unspecified: Secondary | ICD-10-CM | POA: Diagnosis not present

## 2020-05-28 DIAGNOSIS — E782 Mixed hyperlipidemia: Secondary | ICD-10-CM | POA: Diagnosis not present

## 2020-05-28 DIAGNOSIS — I10 Essential (primary) hypertension: Secondary | ICD-10-CM | POA: Diagnosis not present

## 2020-05-28 DIAGNOSIS — N183 Chronic kidney disease, stage 3 unspecified: Secondary | ICD-10-CM | POA: Diagnosis not present

## 2020-06-01 ENCOUNTER — Other Ambulatory Visit: Payer: Self-pay

## 2020-06-01 ENCOUNTER — Ambulatory Visit: Payer: Medicare HMO | Admitting: Vascular Surgery

## 2020-06-01 ENCOUNTER — Ambulatory Visit (HOSPITAL_COMMUNITY)
Admission: RE | Admit: 2020-06-01 | Discharge: 2020-06-01 | Disposition: A | Discharge status: Home / Self Care | Payer: Medicare HMO | Source: Ambulatory Visit | Attending: Vascular Surgery | Admitting: Vascular Surgery

## 2020-06-01 ENCOUNTER — Encounter: Payer: Self-pay | Admitting: Vascular Surgery

## 2020-06-01 VITALS — BP 131/72 | HR 57 | Temp 98.1°F | Resp 20 | Ht 74.0 in | Wt 223.0 lb

## 2020-06-01 DIAGNOSIS — I739 Peripheral vascular disease, unspecified: Secondary | ICD-10-CM | POA: Diagnosis not present

## 2020-06-01 DIAGNOSIS — M25569 Pain in unspecified knee: Secondary | ICD-10-CM | POA: Insufficient documentation

## 2020-06-01 NOTE — Progress Notes (Signed)
Patient ID: Samuel Watkins, male   DOB: March 11, 1946, 74 y.o.   MRN: [id number]  Reason for Consult: New Patient (Initial Visit)   Referred by Lawerance Cruel, MD  Subjective:     HPI:  Samuel Watkins is a 74 y.o. male history of coronary artery disease also has diabetes, hyperlipidemia, hypertension has vascular risk factors.  He is a former smoker quit several years ago.  He states he has bilateral lower extremity pain when he walks up steps.  This is from the knee down.  It is not cramping in nature and nonradiating.  He does not have any tissue loss or ulceration.  He has numbness of his bilateral feet secondary to diabetic neuropathy this is persistent unchanging with exercise.  He denies any history of TIA amaurosis or stroke.  He has no personal or family history of aneurysm disease.  Past Medical History:  Diagnosis Date  . CAD (coronary artery disease)   . Chronic hepatitis B ([place])   . COPD (chronic obstructive pulmonary disease) ([place])   . Diabetes mellitus without complication ([place])   . Hyperlipemia   . Hypertension   . Myocardial infarction [facility])    Family History  Problem Relation Age of Onset  . Throat cancer Brother   . Heart disease Father   . Heart disease Mother    Past Surgical History:  Procedure Laterality Date  . ABDOMINAL AORTIC ANEURYSM REPAIR    . [place] SURGERY  1997  . LUNG SURGERY     found spot on lung 10 years ago    Short Social History:  Social History   Tobacco Use  . Smoking status: Former Smoker    Packs/day: 1.00    Years: 50.00    Pack years: 50.00    Types: Cigarettes    Quit date: 07/14/2010    Years since quitting: 9.8  . Smokeless tobacco: Never Used  . Tobacco comment: Counseled to remain smoke free.  Substance Use Topics  . Alcohol use: No    Alcohol/week: 0.0 standard drinks    No Known Allergies  Current Outpatient Medications  Medication Sig Dispense Refill  . albuterol (PROVENTIL HFA;VENTOLIN HFA) 108 (90  BASE) MCG/ACT inhaler Inhale 2 puffs into the lungs 4 (four) times daily as needed for wheezing or shortness of breath.    Marland Kitchen amLODipine (NORVASC) 10 MG tablet Take 1 tablet (10 mg total) by mouth daily. 90 tablet 3  . aspirin EC 81 MG tablet Take 1 tablet (81 mg total) by mouth daily. 90 tablet 3  . atorvastatin (LIPITOR) 80 MG tablet Take 80 mg by mouth daily.      . budesonide-formoterol (SYMBICORT) 160-4.5 MCG/ACT inhaler Inhale 2 puffs into the lungs 2 (two) times daily.    . Cholecalciferol (VITAMIN D3) 5000 units CAPS Take 5,000 Units by mouth daily.    Marland Kitchen glimepiride (AMARYL) 2 MG tablet Take 2 mg by mouth daily with breakfast.    . irbesartan (AVAPRO) 300 MG tablet Take 0.5 tablets (150 mg total) by mouth daily. 90 tablet 3  . Magnesium Oxide 420 MG TABS Take 2 tablets by mouth daily. EXCEPT Sundays    . metFORMIN (GLUCOPHAGE) 500 MG tablet Take 1,000 mg by mouth 2 (two) times daily with a meal.     . niacin 500 MG tablet Take 1,000 mg by mouth at bedtime.    . nitroGLYCERIN (NITROSTAT) 0.4 MG SL tablet Place 0.4 mg under the tongue every 5 (five) minutes  x 3 doses as needed for chest pain (Call 911 at 3rd dose within 15 minutes.).    Marland Kitchen pantoprazole (PROTONIX) 40 MG tablet Take 40 mg by mouth daily.    . Tiotropium Bromide-Olodaterol (STIOLTO RESPIMAT) 2.5-2.5 MCG/ACT AERS Inhale 2 puffs into the lungs daily.     No current facility-administered medications for this visit.    Review of Systems  Constitutional:  Constitutional negative. HENT: HENT negative.  Eyes: Eyes negative.  Respiratory: Positive for shortness of breath.  GI: Gastrointestinal negative.  Musculoskeletal: Positive for gait problem, leg pain and joint pain.  Skin: Skin negative.  Hematologic: Hematologic/lymphatic negative.  Psychiatric: Psychiatric negative.        Objective:  Objective   Vitals:   06/01/20 1148  BP: 131/72  Pulse: (!) 57  Resp: 20  Temp: 98.1 F (36.7 C)  SpO2: 93%  Weight: 223 lb  (101.2 kg)  Height: 6\' 2"  (1.88 m)   Body mass index is 28.63 kg/m.  Physical Exam HENT:     Head: Normocephalic.     Nose:     Comments: Wearing a mask Eyes:     Pupils: Pupils are equal, round, and reactive to light.  Neck:     Vascular: No carotid bruit.  Cardiovascular:     Pulses:          Radial pulses are 2+ on the right side and 2+ on the left side.       Femoral pulses are 2+ on the right side and 2+ on the left side.      Popliteal pulses are 2+ on the right side and 0 on the left side.       Dorsalis pedis pulses are 1+ on the right side.  Pulmonary:     Effort: Pulmonary effort is normal.     Breath sounds: Normal breath sounds.  Abdominal:     General: Abdomen is flat.     Palpations: Abdomen is soft.  Musculoskeletal:        General: Swelling present. Normal range of motion.  Skin:    General: Skin is warm and dry.     Capillary Refill: Capillary refill takes less than 2 seconds.  Neurological:     General: No focal deficit present.     Mental Status: He is alert.  Psychiatric:        Mood and Affect: Mood normal.        Behavior: Behavior normal.        Thought Content: Thought content normal.        Judgment: Judgment normal.     Data: ABI Findings:  +---------+------------------+-----+--------+--------+  Right  Rt Pressure (mmHg)IndexWaveformComment   +---------+------------------+-----+--------+--------+  Brachial 125                     +---------+------------------+-----+--------+--------+  PTA   127        0.99 biphasic      +---------+------------------+-----+--------+--------+  DP    123        0.96 biphasic      +---------+------------------+-----+--------+--------+  Great Toe127        0.99 Normal       +---------+------------------+-----+--------+--------+   +---------+------------------+-----+----------+-------+  Left   Lt Pressure  (mmHg)IndexWaveform Comment  +---------+------------------+-----+----------+-------+  Brachial 128                      +---------+------------------+-----+----------+-------+  PTA   99        0.77 biphasic       +---------+------------------+-----+----------+-------+  DP    96        0.75 monophasicbrisk   +---------+------------------+-----+----------+-------+  Great Toe89        0.70 Normal        +---------+------------------+-----+----------+-------+   +-------+-----------+-----------+------------+------------+  ABI/TBIToday's ABIToday's TBIPrevious ABIPrevious TBI  +-------+-----------+-----------+------------+------------+  Right 0.99    0.99    0.95    0.87      +-------+-----------+-----------+------------+------------+  Left  0.77    0.70    0.88    -        +-------+-----------+-----------+------------+------------+      Assessment/Plan:     74 year old male with bilateral lower extremity pain with activity.  Does not appear to be frank claudication as is equal bilaterally from the knees down and not associated with cramping.  Does have a palpable dorsalis pedis pulse on the right no palpable left lower extremity pulses but feet do appear well-perfused.  I recommended no intervention at this time patient does not want to have any surgeries noted on absolutely necessary.  He can follow-up in 1 year with repeat ABIs.    Waynetta Sandy MD [facility]

## 2020-06-22 ENCOUNTER — Ambulatory Visit (INDEPENDENT_AMBULATORY_CARE_PROVIDER_SITE_OTHER)
Admission: RE | Admit: 2020-06-22 | Discharge: 2020-06-22 | Disposition: A | Discharge status: Home / Self Care | Payer: Medicare HMO | Source: Ambulatory Visit | Attending: Family Medicine | Admitting: Family Medicine

## 2020-06-22 ENCOUNTER — Other Ambulatory Visit: Payer: Self-pay

## 2020-06-22 DIAGNOSIS — Z87891 Personal history of nicotine dependence: Secondary | ICD-10-CM | POA: Diagnosis not present

## 2020-06-22 DIAGNOSIS — Z122 Encounter for screening for malignant neoplasm of respiratory organs: Secondary | ICD-10-CM

## 2020-06-29 NOTE — Progress Notes (Signed)

## 2020-07-03 ENCOUNTER — Telehealth: Payer: Self-pay | Admitting: Acute Care

## 2020-07-03 DIAGNOSIS — Z87891 Personal history of nicotine dependence: Secondary | ICD-10-CM

## 2020-07-03 NOTE — Telephone Encounter (Signed)
Pt informed of CT results per Sarah Groce, NP.  PT verbalized understanding.  Copy sent to PCP.  Order placed for 1 yr f/u CT.  

## 2020-07-14 NOTE — Progress Notes (Unsigned)
Cardiology Office Note:    Date:  07/16/2020   ID:  [name], DOB [birth year], MRN [id number]  PCP:  Daisy Floro, MD  Cardiologist:  Lesleigh Noe, MD   Referring MD: Daisy Floro, MD   Chief Complaint  Patient presents with  . Coronary Artery Disease  . Hypertension    History of Present Illness:    Samuel Watkins is a 75 y.o. male with a hx of hx of coronary artery disease, history of bare metal stent RCA and drug-eluting stent RCA 2001and2008, and LAD DES 2008. Also H/O PAD, DM II, COPD, Hypertension, hyperlipidemia, and AAA repair.  (He is managed at the [facility] in [place] [facility].D.) and [place] [facility] Duane Lope M.D.)  Difficult management strategy and execution because of compliance.  Poor understanding of in relation between risk factors and vascular disease.  He is high risk with 10-year risk score greater than 20%.  Therefore risk factors need to be controlled very tightly.  Switch from losartan to irbesartan by Pharm.D. D.  Irbesartan 150 mg/day because headache.  He stopped taking it and did not follow-up.  Unrestricted salt in his diet.   Past Medical History:  Diagnosis Date  . CAD (coronary artery disease)   . Chronic hepatitis B (HCC)   . COPD (chronic obstructive pulmonary disease) (HCC)   . Diabetes mellitus without complication (HCC)   . Hyperlipemia   . Hypertension   . Myocardial infarction [facility])     Past Surgical History:  Procedure Laterality Date  . ABDOMINAL AORTIC ANEURYSM REPAIR    . LUMBAR DISC SURGERY  1997  . LUNG SURGERY     found spot on lung 10 years ago    Current Medications: Current Meds  Medication Sig  . albuterol (PROVENTIL HFA;VENTOLIN HFA) 108 (90 BASE) MCG/ACT inhaler Inhale 2 puffs into the lungs 4 (four) times daily as needed for wheezing or shortness of breath.  Marland Kitchen amLODipine (NORVASC) 10 MG tablet Take 1 tablet (10 mg total) by mouth daily.  Marland Kitchen aspirin EC 81 MG  tablet Take 1 tablet (81 mg total) by mouth daily.  Marland Kitchen atorvastatin (LIPITOR) 80 MG tablet Take 80 mg by mouth daily.    . budesonide-formoterol (SYMBICORT) 160-4.5 MCG/ACT inhaler Inhale 2 puffs into the lungs 2 (two) times daily.  . Cholecalciferol (VITAMIN D3) 5000 units CAPS Take 5,000 Units by mouth daily.  Marland Kitchen glimepiride (AMARYL) 2 MG tablet Take 2 mg by mouth daily with breakfast.  . losartan-hydrochlorothiazide (HYZAAR) 50-12.5 MG tablet Take 1 tablet by mouth daily.  . Magnesium Oxide 420 MG TABS Take 2 tablets by mouth daily. EXCEPT Sundays  . metFORMIN (GLUCOPHAGE) 500 MG tablet Take 1,000 mg by mouth 2 (two) times daily with a meal.   . niacin 500 MG tablet Take 1,000 mg by mouth at bedtime.  . nitroGLYCERIN (NITROSTAT) 0.4 MG SL tablet Place 0.4 mg under the tongue every 5 (five) minutes x 3 doses as needed for chest pain (Call 911 at 3rd dose within 15 minutes.).  Marland Kitchen pantoprazole (PROTONIX) 40 MG tablet Take 40 mg by mouth daily.  . Tiotropium Bromide-Olodaterol 2.5-2.5 MCG/ACT AERS Inhale 2 puffs into the lungs daily.  . [DISCONTINUED] losartan (COZAAR) 100 MG tablet Take 100 mg by mouth daily.     Allergies:   Patient has no known allergies.   Social History   Socioeconomic History  . Marital status: Married    Spouse name: Not on file  .  Number of children: 3  . Years of education: Not on file  . Highest education level: Not on file  Occupational History  . Occupation: retired  Tobacco Use  . Smoking status: Former Smoker    Packs/day: 1.00    Years: 50.00    Pack years: 50.00    Types: Cigarettes    Quit date: 07/14/2010    Years since quitting: 10.0  . Smokeless tobacco: Never Used  . Tobacco comment: Counseled to remain smoke free.  Vaping Use  . Vaping Use: Never used  Substance and Sexual Activity  . Alcohol use: No    Alcohol/week: 0.0 standard drinks  . Drug use: No  . Sexual activity: Not on file  Other Topics Concern  . Not on file  Social History  Narrative  . Not on file   Social Determinants of Health   Financial Resource Strain: Not on file  Food Insecurity: Not on file  Transportation Needs: Not on file  Physical Activity: Not on file  Stress: Not on file  Social Connections: Not on file     Family History: The patient's family history includes Heart disease in his father and mother; Throat cancer in his brother.  ROS:   Please see the history of present illness.    Leg pain both at rest and with exertion.  Please see the most recent as noted below November 2021.  Appears that he must have some neuropathic component causing the pain.  All other systems reviewed and are negative.  EKGs/Labs/Other Studies Reviewed:    The following studies were reviewed today:  Lower extremity arterial Doppler study June 01, 2020: Summary:  Right: Resting right ankle-brachial index is within normal range. No  evidence of significant right lower extremity arterial disease. The right  toe-brachial index is normal.   Left: Resting left ankle-brachial index indicates moderate left lower  extremity arterial disease. The left toe-brachial index is normal.   EKG:  EKG normal sinus rhythm, right bundle branch block, right axis deviation.  Recent Labs: 09/05/2019: BUN 15; Creatinine, Ser 1.17; Potassium 4.0; Sodium 143  Recent Lipid Panel    Component Value Date/Time   CHOL 93 (L) 06/17/2019 1024   TRIG 79 06/17/2019 1024   HDL 30 (L) 06/17/2019 1024   CHOLHDL 3.1 06/17/2019 1024   CHOLHDL 10.5 07/10/2007 0345   VLDL 21 07/10/2007 0345   LDLCALC 47 06/17/2019 1024    Physical Exam:    VS:  BP (!) 152/78   Pulse 66   Ht 6\' 1"  (1.854 m)   Wt 230 lb 9.6 oz (104.6 kg)   SpO2 95%   BMI 30.42 kg/m     Wt Readings from Last 3 Encounters:  07/16/20 230 lb 9.6 oz (104.6 kg)  06/01/20 223 lb (101.2 kg)  06/03/19 212 lb 4.8 oz (96.3 kg)     GEN: Abdominal obesity. No acute distress HEENT: Normal NECK: No JVD. LYMPHATICS: No  lymphadenopathy CARDIAC: No murmur. RRR S4 gallop is present but no edema. VASCULAR:  Normal Pulses. No bruits. RESPIRATORY:  Clear to auscultation without rales, wheezing or rhonchi  ABDOMEN: Soft, non-tender, non-distended, No pulsatile mass, MUSCULOSKELETAL: No deformity  SKIN: Warm and dry NEUROLOGIC:  Alert and oriented x 3 PSYCHIATRIC:  Normal affect   ASSESSMENT:    1. CAD in native artery   2. Hyperlipidemia, unspecified hyperlipidemia type   3. PAD (peripheral artery disease) (HCC)   4. Essential hypertension   5. Controlled type 2 diabetes mellitus  with other circulatory complication, without long-term current use of insulin (HCC)   6. Educated about COVID-19 virus infection    PLAN:    In order of problems listed above:  1. Secondary prevention reviewed.  No current anginal symptoms. 2. Continue Lipitor 80 mg/day.  LDL December 2020 was normal.  This is followed at the [facility], Dr. Gwynneth Macleod. 3. Claudication bilateral lower extremities.  Please see Doppler study as noted above.  He also has pain at rest is likely related to neuropathy. 4. Elevated and currently not compliant with prescribed medical therapy.  Says losartan 100 mg was discontinued and that irbesartan 150 mg/day was started.  Irbesartan caused headache.  He stopped taking it.  He is not exercising.  Not monitoring salt in his diet.  Has gained weight.  We will discontinue irbesartan.  Start losartan HCT 50/12.5 mg/day.  Basic metabolic panel in 2 to 4 weeks after starting the combination. 5. Most recent hemoglobin A1c in January 2021 was 5.9.  Diabetes is followed by Dr. Newt Lukes at the [facility]. 6. Vaccinated and practicing mitigation.  Target BP: <130/80 mmHg  Diet and lifestyle measures for BP control were reviewed in detail: Low sodium diet (<2.5 gm daily); alcohol restriction (<3 ounces per day); weight loss (Mediterranean); avoid non-steroidal agents; > 6 hours sleep per day; 150 min  moderate exercise per week. Medical regimen will include at least 2 agents. Resistant hypertension if not controlled on 3 agents. Consider further evaluation: Sleep study to r/o OSA; Renal angiogram; Primary hyperaldonism and Pheochromocytoma w/u. After 3 agents, consider MRA (spironolactone)/ Epleronone), hydralazine, beta-blocker, and Minoxidil if not already in use due to patient profile.    Medication Adjustments/Labs and Tests Ordered: Current medicines are reviewed at length with the patient today.  Concerns regarding medicines are outlined above.  Orders Placed This Encounter  Procedures  . Basic metabolic panel  . EKG 12-Lead   Meds ordered this encounter  Medications  . losartan-hydrochlorothiazide (HYZAAR) 50-12.5 MG tablet    Sig: Take 1 tablet by mouth daily.    Dispense:  90 tablet    Refill:  3    D/c plain Losartan    Patient Instructions  Medication Instructions:  1) DISCONTINUE Losartan 100mg  2) START Losartan/HCTZ 50/12.5mg  once daily  *If you need a refill on your cardiac medications before your next appointment, please call your pharmacy*   Lab Work: BMET 2-3 weeks after starting new medication.  Please call the office once you get this medication started so we can get this scheduled.   If you have labs (blood work) drawn today and your tests are completely normal, you will receive your results only by: MyChart Message (if you have MyChart) OR . A paper copy in the mail If you have any lab test that is abnormal or we need to change your treatment, we will call you to review the results.   Testing/Procedures: None   Follow-Up: At [facility], you and your health needs are our priority.  As part of our continuing mission to provide you with exceptional heart care, we have created designated Provider Care Teams.  These Care Teams include your primary Cardiologist (physician) and Advanced Practice Providers (APPs -  Physician Assistants and Nurse  Practitioners) who all work together to provide you with the care you need, when you need it.  We recommend signing up for the patient portal called "MyChart".  Sign up information is provided on this After Visit Summary.  MyChart is used to connect with patients for Virtual Visits (Telemedicine).  Patients are able to view lab/test results, encounter notes, upcoming appointments, etc.  Non-urgent messages can be sent to your provider as well.   To learn more about what you can do with MyChart, go to [email or web address].    Your next appointment:   1 year(s)  The format for your next appointment:   In Person  Provider:   You may see Sinclair Grooms, MD or one of the following Advanced Practice Providers on your designated Care Team:    Truitt Merle, NP  Cecilie Kicks, NP  Kathyrn Drown, NP    Other Instructions      Signed, Sinclair Grooms, MD  07/16/2020 9:42 AM    [place]

## 2020-07-16 ENCOUNTER — Encounter: Payer: Self-pay | Admitting: Interventional Cardiology

## 2020-07-16 ENCOUNTER — Other Ambulatory Visit: Payer: Self-pay

## 2020-07-16 ENCOUNTER — Ambulatory Visit: Payer: Medicare HMO | Admitting: Interventional Cardiology

## 2020-07-16 VITALS — BP 152/78 | HR 66 | Ht 73.0 in | Wt 230.6 lb

## 2020-07-16 DIAGNOSIS — I10 Essential (primary) hypertension: Secondary | ICD-10-CM

## 2020-07-16 DIAGNOSIS — I2510 Atherosclerotic heart disease of native coronary artery without angina pectoris: Secondary | ICD-10-CM | POA: Diagnosis not present

## 2020-07-16 DIAGNOSIS — I739 Peripheral vascular disease, unspecified: Secondary | ICD-10-CM | POA: Diagnosis not present

## 2020-07-16 DIAGNOSIS — E1159 Type 2 diabetes mellitus with other circulatory complications: Secondary | ICD-10-CM

## 2020-07-16 DIAGNOSIS — Z7189 Other specified counseling: Secondary | ICD-10-CM

## 2020-07-16 DIAGNOSIS — E785 Hyperlipidemia, unspecified: Secondary | ICD-10-CM

## 2020-07-16 MED ORDER — LOSARTAN POTASSIUM-HCTZ 50-12.5 MG PO TABS: 1.0000 | ORAL_TABLET | Freq: Every day | ORAL | 3 refills | Status: DC

## 2020-07-16 NOTE — Patient Instructions (Signed)
Medication Instructions:  1) DISCONTINUE Losartan 100mg  2) START Losartan/HCTZ 50/12.5mg  once daily  *If you need a refill on your cardiac medications before your next appointment, please call your pharmacy*   Lab Work: BMET 2-3 weeks after starting new medication.  Please call the office once you get this medication started so we can get this scheduled.   If you have labs (blood work) drawn today and your tests are completely normal, you will receive your results only by: MyChart Message (if you have MyChart) OR . A paper copy in the mail If you have any lab test that is abnormal or we need to change your treatment, we will call you to review the results.   Testing/Procedures: None   Follow-Up: At [facility], you and your health needs are our priority.  As part of our continuing mission to provide you with exceptional heart care, we have created designated Provider Care Teams.  These Care Teams include your primary Cardiologist (physician) and Advanced Practice Providers (APPs -  Physician Assistants and Nurse Practitioners) who all work together to provide you with the care you need, when you need it.  We recommend signing up for the patient portal called "MyChart".  Sign up information is provided on this After Visit Summary.  MyChart is used to connect with patients for Virtual Visits (Telemedicine).  Patients are able to view lab/test results, encounter notes, upcoming appointments, etc.  Non-urgent messages can be sent to your provider as well.   To learn more about what you can do with MyChart, go to [facility].    Your next appointment:   1 year(s)  The format for your next appointment:   In Person  Provider:   You may see [email or web address], MD or one of the following Advanced Practice Providers on your designated Care Team:    Lesleigh Noe, NP  Norma Fredrickson, NP  Nada Boozer, NP    Other Instructions

## 2020-07-31 DIAGNOSIS — J439 Emphysema, unspecified: Secondary | ICD-10-CM | POA: Diagnosis not present

## 2020-07-31 DIAGNOSIS — I2510 Atherosclerotic heart disease of native coronary artery without angina pectoris: Secondary | ICD-10-CM | POA: Diagnosis not present

## 2020-07-31 DIAGNOSIS — I10 Essential (primary) hypertension: Secondary | ICD-10-CM | POA: Diagnosis not present

## 2020-07-31 DIAGNOSIS — J449 Chronic obstructive pulmonary disease, unspecified: Secondary | ICD-10-CM | POA: Diagnosis not present

## 2020-07-31 DIAGNOSIS — E782 Mixed hyperlipidemia: Secondary | ICD-10-CM | POA: Diagnosis not present

## 2020-07-31 DIAGNOSIS — E1149 Type 2 diabetes mellitus with other diabetic neurological complication: Secondary | ICD-10-CM | POA: Diagnosis not present

## 2020-07-31 DIAGNOSIS — K219 Gastro-esophageal reflux disease without esophagitis: Secondary | ICD-10-CM | POA: Diagnosis not present

## 2020-07-31 DIAGNOSIS — N183 Chronic kidney disease, stage 3 unspecified: Secondary | ICD-10-CM | POA: Diagnosis not present

## 2020-08-20 ENCOUNTER — Telehealth: Payer: Self-pay | Admitting: Interventional Cardiology

## 2020-08-20 MED ORDER — LOSARTAN POTASSIUM-HCTZ 50-12.5 MG PO TABS: 1.0000 | ORAL_TABLET | Freq: Every day | ORAL | 3 refills | Status: AC

## 2020-08-20 NOTE — Telephone Encounter (Signed)
Patient said that Dr. Tamala Julian was supposed to order medicine for patient's blood pressure and send it to [place], [facility]. Please call to verify.

## 2020-08-20 NOTE — Telephone Encounter (Signed)
Spoke with pt and he states that [place] has not received the information about his new medication, Losartan/HCTZ.  Advised this was sent to [place] on 07/16/20. Pt states it has to go to Dr. Glennis Brink office for approval or pharmacy won't fill it.  Pt gave phone and fax number for [facility].  Phone [phone] Fax [phone]  Faxed information to [place].

## 2020-09-25 NOTE — Telephone Encounter (Signed)
Spoke with pt and he states he was finally able to get the Losartan/HCT from the [place].  Has been on it about 2 weeks now.  He will come for labs this Friday.

## 2020-09-28 ENCOUNTER — Other Ambulatory Visit: Payer: Medicare HMO | Admitting: *Deleted

## 2020-09-28 ENCOUNTER — Other Ambulatory Visit: Payer: Self-pay

## 2020-09-28 DIAGNOSIS — I10 Essential (primary) hypertension: Secondary | ICD-10-CM | POA: Diagnosis not present

## 2020-09-28 LAB — BASIC METABOLIC PANEL
BUN/Creatinine Ratio: 13 (ref 10–24)
BUN: 16 mg/dL (ref 8–27)
CO2: 20 mmol/L (ref 20–29)
Calcium: 9.5 mg/dL (ref 8.6–10.2)
Chloride: 99 mmol/L (ref 96–106)
Creatinine, Ser: 1.27 mg/dL (ref 0.76–1.27)
Glucose: 192 mg/dL — ABNORMAL HIGH (ref 65–99)
Potassium: 4.1 mmol/L (ref 3.5–5.2)
Sodium: 136 mmol/L (ref 134–144)
eGFR: 60 mL/min/{1.73_m2} (ref >59)

## 2020-10-08 ENCOUNTER — Telehealth: Payer: Self-pay | Admitting: Interventional Cardiology

## 2020-10-08 DIAGNOSIS — E1149 Type 2 diabetes mellitus with other diabetic neurological complication: Secondary | ICD-10-CM | POA: Diagnosis not present

## 2020-10-08 DIAGNOSIS — I2510 Atherosclerotic heart disease of native coronary artery without angina pectoris: Secondary | ICD-10-CM | POA: Diagnosis not present

## 2020-10-08 DIAGNOSIS — E782 Mixed hyperlipidemia: Secondary | ICD-10-CM | POA: Diagnosis not present

## 2020-10-08 DIAGNOSIS — I10 Essential (primary) hypertension: Secondary | ICD-10-CM | POA: Diagnosis not present

## 2020-10-08 DIAGNOSIS — N183 Chronic kidney disease, stage 3 unspecified: Secondary | ICD-10-CM | POA: Diagnosis not present

## 2020-10-08 DIAGNOSIS — J439 Emphysema, unspecified: Secondary | ICD-10-CM | POA: Diagnosis not present

## 2020-10-08 DIAGNOSIS — K219 Gastro-esophageal reflux disease without esophagitis: Secondary | ICD-10-CM | POA: Diagnosis not present

## 2020-10-08 DIAGNOSIS — J449 Chronic obstructive pulmonary disease, unspecified: Secondary | ICD-10-CM | POA: Diagnosis not present

## 2020-10-08 NOTE — Telephone Encounter (Signed)
  Patient was returning a call regarding his lab results

## 2020-10-08 NOTE — Telephone Encounter (Signed)
Called patient and notified him of test results and MD remarks.  Patient verbalizes understanding no questions or concerns voiced.

## 2020-10-15 DIAGNOSIS — Z0000 Encounter for general adult medical examination without abnormal findings: Secondary | ICD-10-CM | POA: Diagnosis not present

## 2020-10-15 DIAGNOSIS — Z1389 Encounter for screening for other disorder: Secondary | ICD-10-CM | POA: Diagnosis not present

## 2020-10-22 DIAGNOSIS — I700 Atherosclerosis of aorta: Secondary | ICD-10-CM | POA: Diagnosis not present

## 2020-10-22 DIAGNOSIS — N183 Chronic kidney disease, stage 3 unspecified: Secondary | ICD-10-CM | POA: Diagnosis not present

## 2020-10-22 DIAGNOSIS — J449 Chronic obstructive pulmonary disease, unspecified: Secondary | ICD-10-CM | POA: Diagnosis not present

## 2020-10-22 DIAGNOSIS — L299 Pruritus, unspecified: Secondary | ICD-10-CM | POA: Diagnosis not present

## 2020-10-22 DIAGNOSIS — Z0000 Encounter for general adult medical examination without abnormal findings: Secondary | ICD-10-CM | POA: Diagnosis not present

## 2020-10-22 DIAGNOSIS — E782 Mixed hyperlipidemia: Secondary | ICD-10-CM | POA: Diagnosis not present

## 2020-10-22 DIAGNOSIS — I739 Peripheral vascular disease, unspecified: Secondary | ICD-10-CM | POA: Diagnosis not present

## 2020-10-22 DIAGNOSIS — E1149 Type 2 diabetes mellitus with other diabetic neurological complication: Secondary | ICD-10-CM | POA: Diagnosis not present

## 2020-10-22 DIAGNOSIS — I10 Essential (primary) hypertension: Secondary | ICD-10-CM | POA: Diagnosis not present

## 2020-11-23 DIAGNOSIS — J439 Emphysema, unspecified: Secondary | ICD-10-CM | POA: Diagnosis not present

## 2020-11-23 DIAGNOSIS — E1149 Type 2 diabetes mellitus with other diabetic neurological complication: Secondary | ICD-10-CM | POA: Diagnosis not present

## 2020-11-23 DIAGNOSIS — N183 Chronic kidney disease, stage 3 unspecified: Secondary | ICD-10-CM | POA: Diagnosis not present

## 2020-11-23 DIAGNOSIS — I2510 Atherosclerotic heart disease of native coronary artery without angina pectoris: Secondary | ICD-10-CM | POA: Diagnosis not present

## 2020-11-23 DIAGNOSIS — E782 Mixed hyperlipidemia: Secondary | ICD-10-CM | POA: Diagnosis not present

## 2020-11-23 DIAGNOSIS — I10 Essential (primary) hypertension: Secondary | ICD-10-CM | POA: Diagnosis not present

## 2020-11-23 DIAGNOSIS — J449 Chronic obstructive pulmonary disease, unspecified: Secondary | ICD-10-CM | POA: Diagnosis not present

## 2020-11-23 DIAGNOSIS — K219 Gastro-esophageal reflux disease without esophagitis: Secondary | ICD-10-CM | POA: Diagnosis not present

## 2020-12-02 DIAGNOSIS — R0981 Nasal congestion: Secondary | ICD-10-CM | POA: Diagnosis not present

## 2020-12-02 DIAGNOSIS — J40 Bronchitis, not specified as acute or chronic: Secondary | ICD-10-CM | POA: Diagnosis not present

## 2020-12-02 DIAGNOSIS — J019 Acute sinusitis, unspecified: Secondary | ICD-10-CM | POA: Diagnosis not present

## 2020-12-02 DIAGNOSIS — U071 COVID-19: Secondary | ICD-10-CM | POA: Diagnosis not present

## 2020-12-02 DIAGNOSIS — R059 Cough, unspecified: Secondary | ICD-10-CM | POA: Diagnosis not present

## 2020-12-02 DIAGNOSIS — Z8709 Personal history of other diseases of the respiratory system: Secondary | ICD-10-CM | POA: Diagnosis not present

## 2021-01-06 DIAGNOSIS — I10 Essential (primary) hypertension: Secondary | ICD-10-CM | POA: Diagnosis not present

## 2021-01-06 DIAGNOSIS — J439 Emphysema, unspecified: Secondary | ICD-10-CM | POA: Diagnosis not present

## 2021-01-06 DIAGNOSIS — K219 Gastro-esophageal reflux disease without esophagitis: Secondary | ICD-10-CM | POA: Diagnosis not present

## 2021-01-06 DIAGNOSIS — J449 Chronic obstructive pulmonary disease, unspecified: Secondary | ICD-10-CM | POA: Diagnosis not present

## 2021-01-06 DIAGNOSIS — I2510 Atherosclerotic heart disease of native coronary artery without angina pectoris: Secondary | ICD-10-CM | POA: Diagnosis not present

## 2021-01-06 DIAGNOSIS — E782 Mixed hyperlipidemia: Secondary | ICD-10-CM | POA: Diagnosis not present

## 2021-01-06 DIAGNOSIS — N183 Chronic kidney disease, stage 3 unspecified: Secondary | ICD-10-CM | POA: Diagnosis not present

## 2021-01-06 DIAGNOSIS — E1149 Type 2 diabetes mellitus with other diabetic neurological complication: Secondary | ICD-10-CM | POA: Diagnosis not present

## 2021-01-28 DIAGNOSIS — M546 Pain in thoracic spine: Secondary | ICD-10-CM | POA: Diagnosis not present

## 2021-04-23 DIAGNOSIS — N183 Chronic kidney disease, stage 3 unspecified: Secondary | ICD-10-CM | POA: Diagnosis not present

## 2021-04-23 DIAGNOSIS — I2510 Atherosclerotic heart disease of native coronary artery without angina pectoris: Secondary | ICD-10-CM | POA: Diagnosis not present

## 2021-04-23 DIAGNOSIS — I10 Essential (primary) hypertension: Secondary | ICD-10-CM | POA: Diagnosis not present

## 2021-04-23 DIAGNOSIS — J449 Chronic obstructive pulmonary disease, unspecified: Secondary | ICD-10-CM | POA: Diagnosis not present

## 2021-04-23 DIAGNOSIS — K219 Gastro-esophageal reflux disease without esophagitis: Secondary | ICD-10-CM | POA: Diagnosis not present

## 2021-04-23 DIAGNOSIS — J439 Emphysema, unspecified: Secondary | ICD-10-CM | POA: Diagnosis not present

## 2021-04-23 DIAGNOSIS — E1149 Type 2 diabetes mellitus with other diabetic neurological complication: Secondary | ICD-10-CM | POA: Diagnosis not present

## 2021-04-23 DIAGNOSIS — E782 Mixed hyperlipidemia: Secondary | ICD-10-CM | POA: Diagnosis not present

## 2021-05-02 DIAGNOSIS — Z23 Encounter for immunization: Secondary | ICD-10-CM | POA: Diagnosis not present

## 2021-05-02 DIAGNOSIS — Z0000 Encounter for general adult medical examination without abnormal findings: Secondary | ICD-10-CM | POA: Diagnosis not present

## 2021-05-02 DIAGNOSIS — E1149 Type 2 diabetes mellitus with other diabetic neurological complication: Secondary | ICD-10-CM | POA: Diagnosis not present

## 2021-05-02 DIAGNOSIS — G629 Polyneuropathy, unspecified: Secondary | ICD-10-CM | POA: Diagnosis not present

## 2021-06-24 ENCOUNTER — Other Ambulatory Visit: Payer: Self-pay

## 2021-06-24 ENCOUNTER — Ambulatory Visit (INDEPENDENT_AMBULATORY_CARE_PROVIDER_SITE_OTHER)
Admission: RE | Admit: 2021-06-24 | Discharge: 2021-06-24 | Disposition: A | Discharge status: Home / Self Care | Payer: Medicare HMO | Source: Ambulatory Visit | Attending: Acute Care | Admitting: Acute Care

## 2021-06-24 DIAGNOSIS — Z87891 Personal history of nicotine dependence: Secondary | ICD-10-CM | POA: Diagnosis not present

## 2021-06-26 DIAGNOSIS — E538 Deficiency of other specified B group vitamins: Secondary | ICD-10-CM | POA: Diagnosis not present

## 2021-06-26 DIAGNOSIS — E1140 Type 2 diabetes mellitus with diabetic neuropathy, unspecified: Secondary | ICD-10-CM | POA: Diagnosis not present

## 2021-06-26 DIAGNOSIS — I10 Essential (primary) hypertension: Secondary | ICD-10-CM | POA: Diagnosis not present

## 2021-06-26 DIAGNOSIS — E785 Hyperlipidemia, unspecified: Secondary | ICD-10-CM | POA: Diagnosis not present

## 2021-06-26 DIAGNOSIS — J449 Chronic obstructive pulmonary disease, unspecified: Secondary | ICD-10-CM | POA: Diagnosis not present

## 2021-06-26 DIAGNOSIS — I2510 Atherosclerotic heart disease of native coronary artery without angina pectoris: Secondary | ICD-10-CM | POA: Diagnosis not present

## 2021-06-26 DIAGNOSIS — E118 Type 2 diabetes mellitus with unspecified complications: Secondary | ICD-10-CM | POA: Diagnosis not present

## 2021-06-26 DIAGNOSIS — E559 Vitamin D deficiency, unspecified: Secondary | ICD-10-CM | POA: Diagnosis not present

## 2021-07-04 ENCOUNTER — Other Ambulatory Visit: Payer: Self-pay | Admitting: Acute Care

## 2021-07-04 DIAGNOSIS — Z87891 Personal history of nicotine dependence: Secondary | ICD-10-CM

## 2021-07-11 ENCOUNTER — Other Ambulatory Visit: Payer: Self-pay

## 2021-07-16 ENCOUNTER — Encounter: Payer: Self-pay | Admitting: *Deleted

## 2021-07-17 ENCOUNTER — Ambulatory Visit: Payer: Medicare HMO | Admitting: Diagnostic Neuroimaging

## 2021-07-17 ENCOUNTER — Encounter: Payer: Self-pay | Admitting: Diagnostic Neuroimaging

## 2021-07-17 VITALS — BP 113/69 | HR 84 | Ht 73.0 in | Wt 223.0 lb

## 2021-07-17 DIAGNOSIS — E1142 Type 2 diabetes mellitus with diabetic polyneuropathy: Secondary | ICD-10-CM

## 2021-07-17 NOTE — Patient Instructions (Addendum)
-   increase gabapentin to 600mg  at 5pm; gradually may increase up to 900mg  twice a day   - try capsaicin cream, lidocaine patch / cream, alpha-lipoic acid 600mg  daily

## 2021-07-17 NOTE — Progress Notes (Signed)
[facility]  PATIENT: Samuel Watkins DOB: 30-Nov-1945  REFERRING CLINICIAN: Lawerance Cruel, MD HISTORY FROM: patient  REASON FOR VISIT: new consult    HISTORICAL  CHIEF COMPLAINT:  Chief Complaint  Patient presents with   Peripheral Neuropathy    Rm 7 New Pt "neuropathy in my feet, stay numb all the time, toes hurt badly at night"     HISTORY OF PRESENT ILLNESS:   76 year old male with diabetic neuropathy.  Patient diagnosed with diabetes about 10 years ago with hemoglobin A1c's of 8-9.  A1c is improved down to 7.  He had onset of numbness and pain in the toes and feet around the diagnosis of diabetes.  He has struggled with this over time.  He has tried gabapentin 3 mg at bedtime without relief.  Pain starts around 10 PM which is around when he takes gabapentin.  He has not tried higher doses.  He has not tried other medications for this problem.   REVIEW OF SYSTEMS: Full 14 system review of systems performed and negative with exception of: as per HPI.  ALLERGIES: No Known Allergies  HOME MEDICATIONS: Outpatient Medications Prior to Visit  Medication Sig Dispense Refill   albuterol (PROVENTIL HFA;VENTOLIN HFA) 108 (90 BASE) MCG/ACT inhaler Inhale 2 puffs into the lungs 4 (four) times daily as needed for wheezing or shortness of breath.     amLODipine (NORVASC) 10 MG tablet Take 1 tablet (10 mg total) by mouth daily. 90 tablet 3   aspirin EC 81 MG tablet Take 1 tablet (81 mg total) by mouth daily. 90 tablet 3   atorvastatin (LIPITOR) 80 MG tablet Take 80 mg by mouth daily.       budesonide-formoterol (SYMBICORT) 160-4.5 MCG/ACT inhaler Inhale 2 puffs into the lungs 2 (two) times daily.     Cholecalciferol (VITAMIN D3) 5000 units CAPS Take 5,000 Units by mouth daily.     gabapentin (NEURONTIN) 300 MG capsule Take 1 capsule by mouth at bedtime.     glimepiride (AMARYL) 2 MG tablet Take 2 mg by mouth daily with breakfast.     losartan-hydrochlorothiazide  (HYZAAR) 50-12.5 MG tablet Take 1 tablet by mouth daily. 90 tablet 3   Magnesium Oxide 420 MG TABS Take 2 tablets by mouth daily. EXCEPT Sundays     metFORMIN (GLUCOPHAGE) 500 MG tablet Take 1,000 mg by mouth 2 (two) times daily with a meal.      niacin 500 MG tablet Take 1,000 mg by mouth at bedtime.     nitroGLYCERIN (NITROSTAT) 0.4 MG SL tablet Place 0.4 mg under the tongue every 5 (five) minutes x 3 doses as needed for chest pain (Call 911 at 3rd dose within 15 minutes.).     pantoprazole (PROTONIX) 40 MG tablet Take 40 mg by mouth daily.     pioglitazone (ACTOS) 30 MG tablet TAKE ONE-HALF TABLET BY MOUTH DAILY FOR DIABETES     Tiotropium Bromide-Olodaterol 2.5-2.5 MCG/ACT AERS Inhale 2 puffs into the lungs daily.     No facility-administered medications prior to visit.    PAST MEDICAL HISTORY: Past Medical History:  Diagnosis Date   CAD (coronary artery disease)    Chronic hepatitis B (HCC)    CKD (chronic kidney disease)    COPD (chronic obstructive pulmonary disease) (HCC)    Diabetes mellitus without complication (HCC)    Hyperlipemia    Hypertension    Myocardial infarction (HCC)    OSA on CPAP    Peripheral neuropathy  PAST SURGICAL HISTORY: Past Surgical History:  Procedure Laterality Date   ABDOMINAL AORTIC ANEURYSM REPAIR     LUMBAR [place]   LUNG SURGERY     found spot on lung 10 years ago    FAMILY HISTORY: Family History  Problem Relation Age of Onset   Throat cancer Brother    Heart disease Father    Heart disease Mother     SOCIAL HISTORY: Social History   Socioeconomic History   Marital status: Married    Spouse name: [name]   Number of children: 3   Years of education: 12   Highest education level: Not on file  Occupational History   Occupation: retired  Tobacco Use   Smoking status: Former    Packs/day: 1.00    Years: 50.00    Pack years: 50.00    Types: Cigarettes    Quit date: 07/14/2010    Years since quitting: 11.0    Smokeless tobacco: Never   Tobacco comments:    Counseled to remain smoke free.  Vaping Use   Vaping Use: Never used  Substance and Sexual Activity   Alcohol use: No    Alcohol/week: 0.0 standard drinks   Drug use: No   Sexual activity: Not on file  Other Topics Concern   Not on file  Social History Narrative   Lives with wife   Caffeine- coffee 2 c, soda 1, tea 1   Social Determinants of Health   Financial Resource Strain: Not on file  Food Insecurity: Not on file  Transportation Needs: Not on file  Physical Activity: Not on file  Stress: Not on file  Social Connections: Not on file  Intimate Partner Violence: Not on file     PHYSICAL EXAM  GENERAL EXAM/CONSTITUTIONAL: Vitals:  Vitals:   07/17/21 1313  BP: 113/69  Pulse: 84  Weight: 223 lb (101.2 kg)  Height: 6\' 1"  (1.854 m)   Body mass index is 29.42 kg/m. Wt Readings from Last 3 Encounters:  07/17/21 223 lb (101.2 kg)  07/16/20 230 lb 9.6 oz (104.6 kg)  06/01/20 223 lb (101.2 kg)   Patient is in no distress; well developed, nourished and groomed; neck is supple  CARDIOVASCULAR: Examination of carotid arteries is normal; no carotid bruits Regular rate and rhythm, no murmurs Examination of peripheral vascular system by observation and palpation is normal  EYES: Ophthalmoscopic exam of optic discs and posterior segments is normal; no papilledema or hemorrhages No results found.  MUSCULOSKELETAL: Gait, strength, tone, movements noted in Neurologic exam below  NEUROLOGIC: MENTAL STATUS:  No flowsheet data found. awake, alert, oriented to person, place and time recent and remote memory intact normal attention and concentration language fluent, comprehension intact, naming intact fund of knowledge appropriate  CRANIAL NERVE:  2nd - no papilledema on fundoscopic exam 2nd, 3rd, 4th, 6th - pupils equal and reactive to light, visual fields full to confrontation, extraocular muscles intact, no  nystagmus 5th - facial sensation symmetric 7th - facial strength symmetric 8th - hearing intact 9th - palate elevates symmetrically, uvula midline 11th - shoulder shrug symmetric 12th - tongue protrusion midline  MOTOR:  normal bulk and tone, full strength in the BUE, BLE  SENSORY:  normal and symmetric to light touch; decr vib at knees and absent at toes  COORDINATION:  finger-nose-finger, fine finger movements normal  REFLEXES:  deep tendon reflexes TRACE and symmetric  GAIT/STATION:  narrow based gait     DIAGNOSTIC DATA (LABS, IMAGING, TESTING) -  I reviewed patient records, labs, notes, testing and imaging myself where available.  Lab Results  Component Value Date   WBC 7.1 04/21/2011   HGB 11.6 (L) 04/21/2011   HCT 32.7 (L) 04/21/2011   MCV 94.2 04/21/2011   PLT 138 (L) 04/21/2011      Component Value Date/Time   NA 136 09/28/2020 1021   K 4.1 09/28/2020 1021   CL 99 09/28/2020 1021   CO2 20 09/28/2020 1021   GLUCOSE 192 (H) 09/28/2020 1021   GLUCOSE 124 (H) 04/21/2011 0640   BUN 16 09/28/2020 1021   CREATININE 1.27 09/28/2020 1021   CALCIUM 9.5 09/28/2020 1021   PROT 7.0 06/17/2019 1024   ALBUMIN 4.5 06/17/2019 1024   AST 25 06/17/2019 1024   ALT 32 06/17/2019 1024   ALKPHOS 71 06/17/2019 1024   BILITOT 1.2 06/17/2019 1024   GFRNONAA 62 09/05/2019 1100   GFRAA 72 09/05/2019 1100   Lab Results  Component Value Date   CHOL 93 (L) 06/17/2019   HDL 30 (L) 06/17/2019   LDLCALC 47 06/17/2019   TRIG 79 06/17/2019   CHOLHDL 3.1 06/17/2019   No results found for: HGBA1C No results found for: VITAMINB12 Lab Results  Component Value Date   TSH 1.305 Test methodology is 3rd generation TSH 07/09/2007      ASSESSMENT AND PLAN  76 y.o. year old male here with:   Dx:  1. Diabetic polyneuropathy associated with type 2 diabetes mellitus ([place])      PLAN:  DIABETIC NEUROPATHY  - continue to improve diabetic control  - increase gabapentin to  600mg  at 5pm; gradually may increase up to 900mg  twice a day   - try capsaicin cream, lidocaine patch / cream, alpha-lipoic acid 600mg  daily  - Other Painful neuropathy treatment options: duloxetine 30-60mg  daily, amitriptyline 25-50mg  at bedtime, pregabalin 75-150mg  twice a day   Return for return to PCP, pending if symptoms worsen or fail to improve.    Penni Bombard, MD 03/16/8181, 9:93 PM Certified in Neurology, Neurophysiology and Neuroimaging  [facility] [address], [place], [place] [address] [phone]

## 2021-07-19 DIAGNOSIS — R059 Cough, unspecified: Secondary | ICD-10-CM | POA: Diagnosis not present

## 2021-07-19 DIAGNOSIS — Z8709 Personal history of other diseases of the respiratory system: Secondary | ICD-10-CM | POA: Diagnosis not present

## 2021-07-19 DIAGNOSIS — J209 Acute bronchitis, unspecified: Secondary | ICD-10-CM | POA: Diagnosis not present

## 2021-09-11 NOTE — Progress Notes (Signed)
Cardiology Office Note:    Date:  09/13/2021   ID:  Samuel Watkins, DOB February 16, 1946, MRN [id number]  PCP:  Lawerance Cruel, MD  Cardiologist:  Sinclair Grooms, MD   Referring MD: Lawerance Cruel, MD   Chief Complaint  Patient presents with   Coronary Artery Disease   Hypertension   Hyperlipidemia    History of Present Illness:    Samuel Watkins is a 75 y.o. male with a hx of coronary artery disease, history of bare metal stent RCA and drug-eluting stent RCA 2001 and 2008, and LAD DES 2008. Also H/O PAD, DM II, COPD, Hypertension, hyperlipidemia, and AAA repair.   Progressive shortness of breath.  He assumes this is related to COPD.  He denies orthopnea lower extremity swelling.  He denies angina.  He has difficulty climbing stairs because his legs hurt.  PAD evaluation was performed and did not reveal any significant abnormality.  Past Medical History:  Diagnosis Date   CAD (coronary artery disease)    Chronic hepatitis B ([place])    CKD (chronic kidney disease)    COPD (chronic obstructive pulmonary disease) (HCC)    Diabetes mellitus without complication (HCC)    Hyperlipemia    Hypertension    Myocardial infarction (HCC)    OSA on CPAP    Peripheral neuropathy     Past Surgical History:  Procedure Laterality Date   ABDOMINAL AORTIC ANEURYSM REPAIR     LUMBAR [place]     found spot on lung 10 years ago    Current Medications: Current Meds  Medication Sig   albuterol (PROVENTIL HFA;VENTOLIN HFA) 108 (90 BASE) MCG/ACT inhaler Inhale 2 puffs into the lungs 4 (four) times daily as needed for wheezing or shortness of breath.   amLODipine (NORVASC) 10 MG tablet Take 1 tablet (10 mg total) by mouth daily.   aspirin EC 81 MG tablet Take 1 tablet (81 mg total) by mouth daily.   atorvastatin (LIPITOR) 80 MG tablet Take 80 mg by mouth daily.     budesonide-formoterol (SYMBICORT) 160-4.5 MCG/ACT inhaler Inhale 2 puffs into the lungs 2 (two)  times daily.   carboxymethylcellulose (REFRESH PLUS) 0.5 % SOLN Place 1 drop into both eyes daily.   Cholecalciferol (VITAMIN D3) 5000 units CAPS Take 5,000 Units by mouth daily.   gabapentin (NEURONTIN) 300 MG capsule Take 1 capsule by mouth at bedtime.   glimepiride (AMARYL) 2 MG tablet Take 2 mg by mouth daily with breakfast.   glucose blood (ACCU-CHEK SMARTVIEW) test strip See admin instructions.   losartan-hydrochlorothiazide (HYZAAR) 50-12.5 MG tablet Take 1 tablet by mouth daily.   Magnesium Oxide 420 MG TABS Take 2 tablets by mouth daily. EXCEPT Sundays   metFORMIN (GLUCOPHAGE) 500 MG tablet Take 1,000 mg by mouth 2 (two) times daily with a meal.    niacin 500 MG tablet Take 1,000 mg by mouth at bedtime.   nitroGLYCERIN (NITROSTAT) 0.4 MG SL tablet Place 0.4 mg under the tongue every 5 (five) minutes x 3 doses as needed for chest pain (Call 911 at 3rd dose within 15 minutes.).   pantoprazole (PROTONIX) 40 MG tablet Take 40 mg by mouth daily.   pioglitazone (ACTOS) 30 MG tablet TAKE ONE-HALF TABLET BY MOUTH DAILY FOR DIABETES   Tiotropium Bromide-Olodaterol 2.5-2.5 MCG/ACT AERS Inhale 2 puffs into the lungs daily.     Allergies:   Patient has no known allergies.   Social History  Socioeconomic History   Marital status: Married    Spouse name: [name]   Number of children: 3   Years of education: 12   Highest education level: Not on file  Occupational History   Occupation: retired  Tobacco Use   Smoking status: Former    Packs/day: 1.00    Years: 50.00    Pack years: 50.00    Types: Cigarettes    Quit date: 07/14/2010    Years since quitting: 11.1   Smokeless tobacco: Never   Tobacco comments:    Counseled to remain smoke free.  Vaping Use   Vaping Use: Never used  Substance and Sexual Activity   Alcohol use: No    Alcohol/week: 0.0 standard drinks   Drug use: No   Sexual activity: Not on file  Other Topics Concern   Not on file  Social History Narrative   Lives  with wife   Caffeine- coffee 2 c, soda 1, tea 1   Social Determinants of Health   Financial Resource Strain: Not on file  Food Insecurity: Not on file  Transportation Needs: Not on file  Physical Activity: Not on file  Stress: Not on file  Social Connections: Not on file     Family History: The patient's family history includes Heart disease in his father and mother; Throat cancer in his brother.  ROS:   Please see the history of present illness.    Has shortness of breath.  We have not considered diastolic heart failure.  All other systems reviewed and are negative.  EKGs/Labs/Other Studies Reviewed:    The following studies were reviewed today:  ECHOCARDIOGRAPHY: We have never performed an echocardiogram  ABI 06/01/2020: Summary:  Right: Resting right ankle-brachial index is within normal range. No  evidence of significant right lower extremity arterial disease. The right  toe-brachial index is normal.   Left: Resting left ankle-brachial index indicates moderate left lower  extremity arterial disease. The left toe-brachial index is normal.   EKG:  EKG sinus bradycardia, normal PR interval, right bundle branch block, right axis deviation consistent with left posterior hemiblock.  When compared to July 16, 2020, no changes noted.  Recent Labs: 09/28/2020: BUN 16; Creatinine, Ser 1.27; Potassium 4.1; Sodium 136  Recent Lipid Panel    Component Value Date/Time   CHOL 93 (L) 06/17/2019 1024   TRIG 79 06/17/2019 1024   HDL 30 (L) 06/17/2019 1024   CHOLHDL 3.1 06/17/2019 1024   CHOLHDL 10.5 07/10/2007 0345   VLDL 21 07/10/2007 0345   LDLCALC 47 06/17/2019 1024    Physical Exam:    VS:  BP 118/68    Pulse (!) 59    Ht 6\' 1"  (1.854 m)    Wt 235 lb (106.6 kg)    SpO2 95%    BMI 31.00 kg/m     Wt Readings from Last 3 Encounters:  09/13/21 235 lb (106.6 kg)  07/17/21 223 lb (101.2 kg)  07/16/20 230 lb 9.6 oz (104.6 kg)     GEN: Overweight. No acute  distress HEENT: Normal NECK: No JVD. LYMPHATICS: No lymphadenopathy CARDIAC: No murmur. RRR no gallop, or edema. VASCULAR:  Normal Pulses. No bruits. RESPIRATORY:  Clear to auscultation without rales, wheezing or rhonchi  ABDOMEN: Soft, non-tender, non-distended, No pulsatile mass, MUSCULOSKELETAL: No deformity  SKIN: Warm and dry NEUROLOGIC:  Alert and oriented x 3 PSYCHIATRIC:  Normal affect   ASSESSMENT:    1. CAD in native artery   2. PAD (peripheral artery disease) (  [place])   3. Hyperlipidemia, unspecified hyperlipidemia type   4. Essential hypertension   5. Controlled type 2 diabetes mellitus with other circulatory complication, without long-term current use of insulin ([place])   6. Dyspnea on exertion    PLAN:    In order of problems listed above:  Secondary prevention discussed Secondary prevention discussed.  Noted above however there is no significant obstructive disease on ABI. Liver and lipid panels being done by the [place].  We will await information on the most recent laboratory data. Excellent blood pressure control.  Low-salt diet and weight control along with exercise or discussed. Most recent A1c was less than or equal to 7.  Dyspnea may improve if there is significant diastolic dysfunction and we add SGLT2 therapy.  That would also give additional glycemic control. 2D Doppler echocardiogram to assess LV systolic and diastolic function.  Consider SGLT2 if significant diastolic dysfunction.   Overall education and awareness concerning secondary risk prevention was discussed in detail: LDL less than 70, hemoglobin A1c less than 7, blood pressure target less than 130/80 mmHg, >150 minutes of moderate aerobic activity per week, avoidance of smoking, weight control (via diet and exercise), and continued surveillance/management of/for obstructive sleep apnea.    Medication Adjustments/Labs and Tests Ordered: Current medicines are reviewed at length with the patient today.   Concerns regarding medicines are outlined above.  Orders Placed This Encounter  Procedures   EKG 12-Lead   No orders of the defined types were placed in this encounter.   Patient Instructions  Medication Instructions:  Your physician recommends that you continue on your current medications as directed. Please refer to the Current Medication list given to you today.  *If you need a refill on your cardiac medications before your next appointment, please call your pharmacy*   Lab Work: None If you have labs (blood work) drawn today and your tests are completely normal, you will receive your results only by: [place] (if you have MyChart) OR A paper copy in the mail If you have any lab test that is abnormal or we need to change your treatment, we will call you to review the results.   Testing/Procedures: None   Follow-Up: At [facility], you and your health needs are our priority.  As part of our continuing mission to provide you with exceptional heart care, we have created designated Provider Care Teams.  These Care Teams include your primary Cardiologist (physician) and Advanced Practice Providers (APPs -  Physician Assistants and Nurse Practitioners) who all work together to provide you with the care you need, when you need it.  We recommend signing up for the patient portal called "MyChart".  Sign up information is provided on this After Visit Summary.  MyChart is used to connect with patients for Virtual Visits (Telemedicine).  Patients are able to view lab/test results, encounter notes, upcoming appointments, etc.  Non-urgent messages can be sent to your provider as well.   To learn more about what you can do with MyChart, go to [email or web address].    Your next appointment:   1 year(s)  The format for your next appointment:   In Person  Provider:   Sinclair Grooms, MD     Other Instructions     Signed, Sinclair Grooms, MD  09/13/2021 9:47 AM    [place]

## 2021-09-13 ENCOUNTER — Other Ambulatory Visit: Payer: Self-pay

## 2021-09-13 ENCOUNTER — Ambulatory Visit: Payer: Medicare HMO | Admitting: Interventional Cardiology

## 2021-09-13 ENCOUNTER — Encounter: Payer: Self-pay | Admitting: Interventional Cardiology

## 2021-09-13 VITALS — BP 118/68 | HR 59 | Ht 73.0 in | Wt 235.0 lb

## 2021-09-13 DIAGNOSIS — R0609 Other forms of dyspnea: Secondary | ICD-10-CM | POA: Diagnosis not present

## 2021-09-13 DIAGNOSIS — I10 Essential (primary) hypertension: Secondary | ICD-10-CM | POA: Diagnosis not present

## 2021-09-13 DIAGNOSIS — E785 Hyperlipidemia, unspecified: Secondary | ICD-10-CM | POA: Diagnosis not present

## 2021-09-13 DIAGNOSIS — I2510 Atherosclerotic heart disease of native coronary artery without angina pectoris: Secondary | ICD-10-CM

## 2021-09-13 DIAGNOSIS — E1159 Type 2 diabetes mellitus with other circulatory complications: Secondary | ICD-10-CM | POA: Diagnosis not present

## 2021-09-13 DIAGNOSIS — I739 Peripheral vascular disease, unspecified: Secondary | ICD-10-CM | POA: Diagnosis not present

## 2021-09-13 NOTE — Patient Instructions (Signed)

## 2021-09-23 ENCOUNTER — Other Ambulatory Visit: Payer: Self-pay

## 2021-09-23 ENCOUNTER — Ambulatory Visit (HOSPITAL_COMMUNITY): Payer: Medicare HMO | Attending: Internal Medicine

## 2021-09-23 DIAGNOSIS — R0609 Other forms of dyspnea: Secondary | ICD-10-CM | POA: Diagnosis not present

## 2021-09-23 LAB — ECHOCARDIOGRAM COMPLETE
Area-P 1/2: 3.14 cm2
S' Lateral: 2.85 cm

## 2021-09-25 ENCOUNTER — Telehealth: Payer: Self-pay | Admitting: *Deleted

## 2021-09-25 DIAGNOSIS — I5189 Other ill-defined heart diseases: Secondary | ICD-10-CM

## 2021-09-25 MED ORDER — EMPAGLIFLOZIN 10 MG PO TABS: 10.0000 mg | ORAL_TABLET | Freq: Every day | ORAL | 3 refills | Status: DC

## 2021-09-25 NOTE — Telephone Encounter (Signed)
Spoke with pt and reviewed results and recommendations.  He is agreeable to start Jardiance '10mg'$  once daily.  Pt will come 10/09/21 for labs.  Pt sees Dr. Daiva Huge at the [facility] and Dr. Meredith Pel, Endocrinology.  He asked that we send this information to them.  States prescription will need to be faxed to Dr. Daiva Huge to facilitate them filling the prescription there.  Pt's wife will come by today to get samples.  Reviewed side effects to watch for.  Pt verbalized understanding and was in agreement with plan.  ? ?Faxed prescription and information to Dr. Daiva Huge. ?

## 2021-09-25 NOTE — Telephone Encounter (Signed)
-----   Message from Belva Crome, MD sent at 09/24/2021  1:25 PM EDT ----- ?Let the patient know there is evidence of diastolic dysfunction which could correlate with shortness of breath.  He needs to try either Jardiance or Farxiga 10 mg/day.  Needs a basic metabolic panel 10 to 14 days after starting.  Clinical follow-up in 4 to 8 weeks to determine how he is doing on the therapy. ?A copy will be sent to Lawerance Cruel, MD ?

## 2021-10-08 ENCOUNTER — Other Ambulatory Visit: Payer: Self-pay

## 2021-10-08 ENCOUNTER — Other Ambulatory Visit: Payer: Medicare HMO | Admitting: *Deleted

## 2021-10-08 DIAGNOSIS — I5189 Other ill-defined heart diseases: Secondary | ICD-10-CM | POA: Diagnosis not present

## 2021-10-08 LAB — BASIC METABOLIC PANEL
BUN/Creatinine Ratio: 13 (ref 10–24)
BUN: 19 mg/dL (ref 8–27)
CO2: 24 mmol/L (ref 20–29)
Calcium: 10.2 mg/dL (ref 8.6–10.2)
Chloride: 101 mmol/L (ref 96–106)
Creatinine, Ser: 1.48 mg/dL — ABNORMAL HIGH (ref 0.76–1.27)
Glucose: 194 mg/dL — ABNORMAL HIGH (ref 70–99)
Potassium: 4 mmol/L (ref 3.5–5.2)
Sodium: 140 mmol/L (ref 134–144)
eGFR: 49 mL/min/{1.73_m2} — ABNORMAL LOW (ref >59)

## 2021-10-09 ENCOUNTER — Other Ambulatory Visit: Payer: Medicare HMO

## 2021-10-28 DIAGNOSIS — E1140 Type 2 diabetes mellitus with diabetic neuropathy, unspecified: Secondary | ICD-10-CM | POA: Diagnosis not present

## 2021-10-28 DIAGNOSIS — E559 Vitamin D deficiency, unspecified: Secondary | ICD-10-CM | POA: Diagnosis not present

## 2021-10-28 DIAGNOSIS — J449 Chronic obstructive pulmonary disease, unspecified: Secondary | ICD-10-CM | POA: Diagnosis not present

## 2021-10-28 DIAGNOSIS — E118 Type 2 diabetes mellitus with unspecified complications: Secondary | ICD-10-CM | POA: Diagnosis not present

## 2021-10-28 DIAGNOSIS — I10 Essential (primary) hypertension: Secondary | ICD-10-CM | POA: Diagnosis not present

## 2021-10-28 DIAGNOSIS — N183 Chronic kidney disease, stage 3 unspecified: Secondary | ICD-10-CM | POA: Diagnosis not present

## 2021-10-28 DIAGNOSIS — I2510 Atherosclerotic heart disease of native coronary artery without angina pectoris: Secondary | ICD-10-CM | POA: Diagnosis not present

## 2021-11-09 NOTE — Progress Notes (Signed)
?Cardiology Office Note:   ? ?Date:  11/11/2021  ? ?ID:  Samuel Watkins, DOB 01/05/1946, MRN [id number] ? ?PCP:  Samuel Cruel, MD  ?Cardiologist:  Samuel Grooms, MD  ? ?Referring MD: Samuel Cruel, MD  ? ?Chief Complaint  ?Patient presents with  ? Coronary Artery Disease  ? Shortness of Breath  ? Congestive Heart Failure  ? ? ?History of Present Illness:   ? ?Samuel Watkins is a 76 y.o. male with a hx of coronary artery disease, history of bare metal stent RCA and drug-eluting stent RCA 2001 and 2008, and LAD DES 2008. Also H/O PAD, DM II, COPD, Hypertension, hyperlipidemia, and AAA repair. ? ?Started on SGLT-2 due to abnormal echo and DOE. ? ?His breathing is not limiting him.  Bilateral hip arthritis is the limiting factor.  He has had no issues.  The SGLT2 was changed from [place] to [place] or vice versa.  They are supplying the medication for him. ? ?He denies angina, palpitations, syncope, neurological complaints, and claudication. ? ?Past Medical History:  ?Diagnosis Date  ? CAD (coronary artery disease)   ? Chronic hepatitis B ([place])   ? CKD (chronic kidney disease)   ? COPD (chronic obstructive pulmonary disease) ([place])   ? Diabetes mellitus without complication ([place])   ? Hyperlipemia   ? Hypertension   ? Myocardial infarction [facility])   ? OSA on CPAP   ? Peripheral neuropathy   ? ? ?Past Surgical History:  ?Procedure Laterality Date  ? ABDOMINAL AORTIC ANEURYSM REPAIR    ? [place] SURGERY  1997  ? LUNG SURGERY    ? found spot on lung 10 years ago  ? ? ?Current Medications: ?Current Meds  ?Medication Sig  ? albuterol (PROVENTIL HFA;VENTOLIN HFA) 108 (90 BASE) MCG/ACT inhaler Inhale 2 puffs into the lungs 4 (four) times daily as needed for wheezing or shortness of breath.  ? amLODipine (NORVASC) 10 MG tablet Take 1 tablet (10 mg total) by mouth daily.  ? aspirin EC 81 MG tablet Take 1 tablet (81 mg total) by mouth daily.  ? atorvastatin (LIPITOR) 80 MG tablet Take 80 mg by mouth daily.    ?  budesonide-formoterol (SYMBICORT) 160-4.5 MCG/ACT inhaler Inhale 2 puffs into the lungs 2 (two) times daily.  ? carboxymethylcellulose (REFRESH PLUS) 0.5 % SOLN Place 1 drop into both eyes daily.  ? Cholecalciferol (VITAMIN D3) 5000 units CAPS Take 5,000 Units by mouth daily.  ? empagliflozin (JARDIANCE) 10 MG TABS tablet Take 1 tablet (10 mg total) by mouth daily.  ? gabapentin (NEURONTIN) 300 MG capsule Take 1 capsule by mouth at bedtime.  ? glimepiride (AMARYL) 2 MG tablet Take 2 mg by mouth daily with breakfast.  ? glucose blood (ACCU-CHEK SMARTVIEW) test strip See admin instructions.  ? losartan-hydrochlorothiazide (HYZAAR) 50-12.5 MG tablet Take 1 tablet by mouth daily.  ? Magnesium Oxide 420 MG TABS Take 2 tablets by mouth daily. EXCEPT Sundays  ? metFORMIN (GLUCOPHAGE) 500 MG tablet Take 1,000 mg by mouth 2 (two) times daily with a meal.   ? niacin 500 MG tablet Take 1,000 mg by mouth at bedtime.  ? nitroGLYCERIN (NITROSTAT) 0.4 MG SL tablet Place 0.4 mg under the tongue every 5 (five) minutes x 3 doses as needed for chest pain (Call 911 at 3rd dose within 15 minutes.).  ? pantoprazole (PROTONIX) 40 MG tablet Take 40 mg by mouth daily.  ? pioglitazone (ACTOS) 30 MG tablet TAKE ONE-HALF TABLET BY MOUTH  DAILY FOR DIABETES  ? Tiotropium Bromide-Olodaterol 2.5-2.5 MCG/ACT AERS Inhale 2 puffs into the lungs daily.  ?  ? ?Allergies:   Patient has no known allergies.  ? ?Social History  ? ?Socioeconomic History  ? Marital status: Married  ?  Spouse name: Samuel Watkins  ? Number of children: 3  ? Years of education: 84  ? Highest education level: Not on file  ?Occupational History  ? Occupation: retired  ?Tobacco Use  ? Smoking status: Former  ?  Packs/day: 1.00  ?  Years: 50.00  ?  Pack years: 50.00  ?  Types: Cigarettes  ?  Quit date: 07/14/2010  ?  Years since quitting: 11.3  ? Smokeless tobacco: Never  ? Tobacco comments:  ?  Counseled to remain smoke free.  ?Vaping Use  ? Vaping Use: Never used  ?Substance and Sexual  Activity  ? Alcohol use: No  ?  Alcohol/week: 0.0 standard drinks  ? Drug use: No  ? Sexual activity: Not on file  ?Other Topics Concern  ? Not on file  ?Social History Narrative  ? Lives with wife  ? Caffeine- coffee 2 c, soda 1, tea 1  ? ?Social Determinants of Health  ? ?Financial Resource Strain: Not on file  ?Food Insecurity: Not on file  ?Transportation Needs: Not on file  ?Physical Activity: Not on file  ?Stress: Not on file  ?Social Connections: Not on file  ?  ? ?Family History: ?The patient's family history includes Heart disease in his father and mother; Throat cancer in his brother. ? ?ROS:   ?Please see the history of present illness.    ?[facility] at the [facility] and with Dr. Melinda Watkins.  Bilateral hip discomfort with physical activity.  All other systems reviewed and are negative. ? ?EKGs/Labs/Other Studies Reviewed:   ? ?The following studies were reviewed today: ? ?2 D Doppler ECHOCARDIOGRAM 09/23/2021: ?IMPRESSIONS  ? 1. Left ventricular ejection fraction, by estimation, is 60 to 65%. Left  ?ventricular ejection fraction by 3D volume is 61 %. The left ventricle has  ?normal function. The left ventricle has no regional wall motion  ?abnormalities. Left ventricular diastolic  ? parameters are consistent with Grade I diastolic dysfunction (impaired  ?relaxation).  ? 2. Right ventricular systolic function is normal. The right ventricular  ?size is normal.  ? 3. Left atrial size was mildly dilated.  ? 4. The mitral valve is grossly normal. Trivial mitral valve  ?regurgitation.  ? 5. The aortic valve is tricuspid. Aortic valve regurgitation is not  ?visualized.  ? 6. Aortic dilatation noted. 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  ? 7. The inferior vena cava is normal in size with greater than 50%  ?respiratory variability, suggesting right atrial pressure of 3 mmHg.  ? ?Comparison(s): No prior Echocardiogram.  ? ?EKG:  EKG a repeat study is not done. ? ?Recent  Labs: ?10/08/2021: BUN 19; Creatinine, Ser 1.48; Potassium 4.0; Sodium 140  ?Recent Lipid Panel ?   ?Component Value Date/Time  ? CHOL 93 (L) 06/17/2019 1024  ? TRIG 79 06/17/2019 1024  ? HDL 30 (L) 06/17/2019 1024  ? CHOLHDL 3.1 06/17/2019 1024  ? CHOLHDL 10.5 07/10/2007 0345  ? VLDL 21 07/10/2007 0345  ? LDLCALC 47 06/17/2019 1024  ? ? ?Physical Exam:   ? ?VS:  BP 128/62   Pulse (!) 58   Ht '6\' 1"'$  (1.854 m)   Wt 229 lb 6.4 oz (104.1 kg)  SpO2 95%   BMI 30.27 kg/m?    ? ?Wt Readings from Last 3 Encounters:  ?11/11/21 229 lb 6.4 oz (104.1 kg)  ?09/13/21 235 lb (106.6 kg)  ?07/17/21 223 lb (101.2 kg)  ?  ? ?GEN: Overweight.. No acute distress ?HEENT: Normal ?NECK: No JVD. ?LYMPHATICS: No lymphadenopathy ?CARDIAC: No murmur. RRR no gallop, or edema. ?VASCULAR:  Normal Pulses. No bruits. ?RESPIRATORY:  Clear to auscultation without rales, wheezing or rhonchi  ?ABDOMEN: Soft, non-tender, non-distended, No pulsatile mass, ?MUSCULOSKELETAL: No deformity  ?SKIN: Warm and dry ?NEUROLOGIC:  Alert and oriented x 3 ?PSYCHIATRIC:  Normal affect  ? ?ASSESSMENT:   ? ?1. CAD in native artery   ?2. Chronic diastolic HF (heart failure) ([place])   ?3. Hyperlipidemia, unspecified hyperlipidemia type   ?4. PAD (peripheral artery disease) ([place])   ?5. Essential hypertension   ?6. Controlled type 2 diabetes mellitus with other circulatory complication, without long-term current use of insulin ([place])   ?7. Dyspnea on exertion   ? ?PLAN:   ? ?In order of problems listed above: ? ?Continue secondary prevention.  No smoking. ?Continue SGLT2 [place] or Vania Rea) based upon what is supplied by the [facility] in [place]. ?Continue high intensity statin therapy. ?Encourage walking to improve tolerance and collateralization of peripheral arterial disease. ?Excellent blood pressure control. ?Continue current therapy.  I do not have his most recent A1c. ?Improved on SGLT2 therapy. ? ?Overall education and awareness concerning secondary  risk prevention was discussed in detail: LDL less than 70, hemoglobin A1c less than 7, blood pressure target less than 130/80 mmHg, >150 minutes of moderate aerobic activity per week, avoidance of smoking, weight control

## 2021-11-11 ENCOUNTER — Ambulatory Visit: Payer: Medicare HMO | Admitting: Interventional Cardiology

## 2021-11-11 ENCOUNTER — Encounter: Payer: Self-pay | Admitting: Interventional Cardiology

## 2021-11-11 VITALS — BP 128/62 | HR 58 | Ht 73.0 in | Wt 229.4 lb

## 2021-11-11 DIAGNOSIS — I2510 Atherosclerotic heart disease of native coronary artery without angina pectoris: Secondary | ICD-10-CM

## 2021-11-11 DIAGNOSIS — R0609 Other forms of dyspnea: Secondary | ICD-10-CM | POA: Diagnosis not present

## 2021-11-11 DIAGNOSIS — I5032 Chronic diastolic (congestive) heart failure: Secondary | ICD-10-CM

## 2021-11-11 DIAGNOSIS — I10 Essential (primary) hypertension: Secondary | ICD-10-CM

## 2021-11-11 DIAGNOSIS — E785 Hyperlipidemia, unspecified: Secondary | ICD-10-CM

## 2021-11-11 DIAGNOSIS — E1159 Type 2 diabetes mellitus with other circulatory complications: Secondary | ICD-10-CM

## 2021-11-11 DIAGNOSIS — I739 Peripheral vascular disease, unspecified: Secondary | ICD-10-CM | POA: Diagnosis not present

## 2021-11-11 NOTE — Patient Instructions (Signed)
Medication Instructions:  ?Your physician recommends that you continue on your current medications as directed. Please refer to the Current Medication list given to you today. ? ?*If you need a refill on your cardiac medications before your next appointment, please call your pharmacy* ? ?Lab Work: ?NONE ? ?Testing/Procedures: ?NONE ? ?Follow-Up: ?At [facility], you and your health needs are our priority.  As part of our continuing mission to provide you with exceptional heart care, we have created designated Provider Care Teams.  These Care Teams include your primary Cardiologist (physician) and Advanced Practice Providers (APPs -  Physician Assistants and Nurse Practitioners) who all work together to provide you with the care you need, when you need it. ? ?We recommend signing up for the patient portal called "MyChart".  Sign up information is provided on this After Visit Summary.  MyChart is used to connect with patients for Virtual Visits (Telemedicine).  Patients are able to view lab/test results, encounter notes, upcoming appointments, etc.  Non-urgent messages can be sent to your provider as well.   ?To learn more about what you can do with MyChart, go to [email or web address].   ? ?Your next appointment:   ?9-12 month(s) ? ?The format for your next appointment:   ?In Person ? ?Provider:   ?Sinclair Grooms, MD { ? ? ?Important Information About Sugar ? ? ? ? ?  ?

## 2021-11-18 DIAGNOSIS — K219 Gastro-esophageal reflux disease without esophagitis: Secondary | ICD-10-CM | POA: Diagnosis not present

## 2021-11-18 DIAGNOSIS — I10 Essential (primary) hypertension: Secondary | ICD-10-CM | POA: Diagnosis not present

## 2021-11-18 DIAGNOSIS — E782 Mixed hyperlipidemia: Secondary | ICD-10-CM | POA: Diagnosis not present

## 2021-11-18 DIAGNOSIS — E1149 Type 2 diabetes mellitus with other diabetic neurological complication: Secondary | ICD-10-CM | POA: Diagnosis not present

## 2021-12-20 ENCOUNTER — Telehealth: Payer: Self-pay | Admitting: Interventional Cardiology

## 2021-12-20 NOTE — Telephone Encounter (Signed)
Pt c/o Shortness Of Breath: STAT if SOB developed within the last 24 hours or pt is noticeably SOB on the phone  1. Are you currently SOB (can you hear that pt is SOB on the phone)? no  2. How long have you been experiencing SOB? Couple days  3. Are you SOB when sitting or when up moving around? Only when walking  4. Are you currently experiencing any other symptoms? States he has a weird feeling all over, but cannot describe it, states it is not numbness   Patient states he has been having SOB in the last couple days when he walks.

## 2021-12-20 NOTE — Telephone Encounter (Signed)
Pt states he "just feels funny" in his chest.  When he walks about 20-25 feet he gets really out of breath and has to stop.  No heart racing or pounding, not irregular.  Gets a little bit lightheaded, denies chest pain, heaviness, tightness but when I ask about if it reminds him of when he needed to get the stents - "kind of - feels like you're going to get a heartburn, but its not heartburn".  Fine otherwise.   BP 117/70s, CBS have been around 160.    I scheduled him with Laurann Montana, NP on Monday.  Pt aware of location address.  I adv him to limit activity over the weekend, minimizing what he knows will bring on the symptoms. Adv to call EMS for worsening symptoms, or if the symptoms come on at rest, and I will send the message to Dr. Tamala Julian to make him aware.

## 2021-12-23 ENCOUNTER — Ambulatory Visit (HOSPITAL_BASED_OUTPATIENT_CLINIC_OR_DEPARTMENT_OTHER): Payer: Medicare HMO | Admitting: Family

## 2021-12-23 ENCOUNTER — Encounter (HOSPITAL_BASED_OUTPATIENT_CLINIC_OR_DEPARTMENT_OTHER): Payer: Self-pay | Admitting: Family

## 2021-12-23 ENCOUNTER — Telehealth (HOSPITAL_BASED_OUTPATIENT_CLINIC_OR_DEPARTMENT_OTHER): Payer: Self-pay

## 2021-12-23 VITALS — BP 106/72 | HR 118 | Ht 73.0 in | Wt 226.0 lb

## 2021-12-23 DIAGNOSIS — G4733 Obstructive sleep apnea (adult) (pediatric): Secondary | ICD-10-CM

## 2021-12-23 DIAGNOSIS — D6859 Other primary thrombophilia: Secondary | ICD-10-CM

## 2021-12-23 DIAGNOSIS — I480 Paroxysmal atrial fibrillation: Secondary | ICD-10-CM

## 2021-12-23 MED ORDER — METOPROLOL SUCCINATE ER 25 MG PO TB24: 25.0000 mg | ORAL_TABLET | Freq: Every day | ORAL | 5 refills | Status: DC

## 2021-12-23 MED ORDER — APIXABAN 5 MG PO TABS: 5.0000 mg | ORAL_TABLET | Freq: Two times a day (BID) | ORAL | 5 refills | Status: DC

## 2021-12-23 NOTE — Telephone Encounter (Signed)
Prior auth for Eliquis '5mg'$  tablet BID.Marland Kitchen per cover my meds prior auth not required

## 2021-12-23 NOTE — Patient Instructions (Addendum)
Medication Instructions:  Your physician has recommended you make the following change in your medication:   STOP Aspirin  START Eliquis one '5mg'$  tablet twice per day  *this is to prevent blood clot or stroke from atrial fibrillation  START Metoprolol succinate one '25mg'$  tablet daily  *this is to help control your heart rate and hopefully get your heart back in a normal rhythm   *If you need a refill on your cardiac medications before your next appointment, please call your pharmacy*   Lab Work: Your physician recommends that you return for lab work this week: TSH, CMP, CBC  You may come to the...   [facility] (3rd floor) [address], [place], [place] [address]  Open: 8am-Noon and 1pm-4:30pm  Please ring the doorbell on the small table when you exit the elevator and the Lab Tech will come get you  [place] [facility] [address] [place], [place], [place] [address] Open: 8am-1pm, then 2pm-4:30pm   [place]- Please see attached locations sheet stapled to your lab work with address and hours.    If you have labs (blood work) drawn today and your tests are completely normal, you will receive your results only by: [place] (if you have MyChart) OR A paper copy in the mail If you have any lab test that is abnormal or we need to change your treatment, we will call you to review the results.  Testing/Procedures: Your EKG today showed atrial fibrillation. This is an irregular heart beat. We treat it with medicines to help control heart rate as well as a blood thinner to help prevent blood clot and stroke.   Follow-Up: At [facility], you and your health needs are our priority.  As part of our continuing mission to provide you with exceptional heart care, we have created designated Provider Care Teams.  These Care Teams include your primary Cardiologist (physician) and Advanced Practice Providers (APPs -  Physician Assistants  and Nurse Practitioners) who all work together to provide you with the care you need, when you need it.  We recommend signing up for the patient portal called "MyChart".  Sign up information is provided on this After Visit Summary.  MyChart is used to connect with patients for Virtual Visits (Telemedicine).  Patients are able to view lab/test results, encounter notes, upcoming appointments, etc.  Non-urgent messages can be sent to your provider as well.   To learn more about what you can do with MyChart, go to [email or web address].    Your next appointment:   3 weeks  The format for your next appointment:   In Person  Provider:   Sinclair Grooms, MD or Loel Dubonnet, NP     Other Instructions  Please check your blood pressure and heart rate once per day and keep a log. If your blood pressure is consistently more than 130/80 or less than 110/60, call to let us know. If your heart rate is consistently more than 110 bpm please call to let us know.  _______________________________  Atrial Fibrillation  Atrial fibrillation is a type of heartbeat that is irregular or fast. If you have this condition, your heart beats without any order. This makes it hard for your heart to pump blood in a normal way. Atrial fibrillation may come and go, or it may become a long-lasting problem. If this condition is not treated, it can put you at higher risk for stroke, heart failure, and other heart problems.  What are the causes? This condition may be caused by diseases that damage the heart. They include: High blood pressure. Heart failure. Heart valve disease. Heart surgery. Other causes include: Diabetes. Thyroid disease. Being overweight. Kidney disease. Sometimes the cause is not known. What increases the risk? You are more likely to develop this condition if: You are older. You smoke. You exercise often and very hard. You have a family history of this condition. You are a man. You  use drugs. You drink a lot of alcohol. You have lung conditions, such as emphysema, pneumonia, or COPD. You have sleep apnea. What are the signs or symptoms? Common symptoms of this condition include: A feeling that your heart is beating very fast. Chest pain or discomfort. Feeling short of breath. Suddenly feeling light-headed or weak. Getting tired easily during activity. Fainting. Sweating. In some cases, there are no symptoms. How is this treated? Treatment for this condition depends on underlying conditions and how you feel when you have atrial fibrillation. They include: Medicines to: Prevent blood clots. Treat heart rate or heart rhythm problems. Using devices, such as a pacemaker, to correct heart rhythm problems. Doing surgery to remove the part of the heart that sends bad signals. Closing an area where clots can form in the heart (left atrial appendage). In some cases, your doctor will treat other underlying conditions. Follow these instructions at home: Medicines Take over-the-counter and prescription medicines only as told by your doctor. Do not take any new medicines without first talking to your doctor. If you are taking blood thinners: Talk with your doctor before you take any medicines that have aspirin or NSAIDs, such as ibuprofen, in them. Take your medicine exactly as told by your doctor. Take it at the same time each day. Avoid activities that could hurt or bruise you. Follow instructions about how to prevent falls. Wear a bracelet that says you are taking blood thinners. Or, carry a card that lists what medicines you take. Lifestyle     Do not use any products that have nicotine or tobacco in them. These include cigarettes, e-cigarettes, and chewing tobacco. If you need help quitting, ask your doctor. Eat heart-healthy foods. Talk with your doctor about the right eating plan for you. Exercise regularly as told by your doctor. Do not drink alcohol. Lose  weight if you are overweight. Do not use drugs, including cannabis. General instructions If you have a condition that causes breathing to stop for a short period of time (apnea), treat it as told by your doctor. Keep a healthy weight. Do not use diet pills unless your doctor says they are safe for you. Diet pills may make heart problems worse. Keep all follow-up visits as told by your doctor. This is important. Contact a doctor if: You notice a change in the speed, rhythm, or strength of your heartbeat. You are taking a blood-thinning medicine and you get more bruising. You get tired more easily when you move or exercise. You have a sudden change in weight. Get help right away if:  You have pain in your chest or your belly (abdomen). You have trouble breathing. You have side effects of blood thinners, such as blood in your vomit, poop (stool), or pee (urine), or bleeding that cannot stop. You have any signs of a stroke. "BE FAST" is an easy way to remember the main warning signs: B - Balance. Signs are dizziness, sudden trouble walking, or loss of balance. E - Eyes. Signs are trouble seeing or  a change in how you see. F - Face. Signs are sudden weakness or loss of feeling in the face, or the face or eyelid drooping on one side. A - Arms. Signs are weakness or loss of feeling in an arm. This happens suddenly and usually on one side of the body. S - Speech. Signs are sudden trouble speaking, slurred speech, or trouble understanding what people say. T - Time. Time to call emergency services. Write down what time symptoms started. You have other signs of a stroke, such as: A sudden, very bad headache with no known cause. Feeling like you may vomit (nausea). Vomiting. A seizure. These symptoms may be an emergency. Do not wait to see if the symptoms will go away. Get medical help right away. Call your local emergency services (911 in the [place] Do not drive yourself to the  hospital. Summary Atrial fibrillation is a type of heartbeat that is irregular or fast. You are at higher risk of this condition if you smoke, are older, have diabetes, or are overweight. Follow your doctor's instructions about medicines, diet, exercise, and follow-up visits. Get help right away if you have signs or symptoms of a stroke. Get help right away if you cannot catch your breath, or you have chest pain or discomfort. This information is not intended to replace advice given to you by your health care provider. Make sure you discuss any questions you have with your health care provider. Document Revised: 12/22/2018 Document Reviewed: 12/22/2018 Elsevier Patient Education  [place].

## 2021-12-23 NOTE — Progress Notes (Signed)
Office Visit    Patient Name: Samuel Watkins Date of Encounter: 12/23/2021  PCP:  Lawerance Cruel, [facility]  Cardiologist:  Sinclair Grooms, MD  Advanced Practice Provider:  No care team member to display Electrophysiologist:  None     Chief Complaint    Samuel Watkins is a 76 y.o. male with a hx of CAD (BMS to RCA and DES to RCA 2001 and 2008, LAD DES 2008), PAD, DM2, COPD, hypertension, hyperlipidemia, AAA s/p repair, atrial fibrillation  presents today for chest discomfort   Past Medical History    Past Medical History:  Diagnosis Date   CAD (coronary artery disease)    Chronic hepatitis B ([place])    CKD (chronic kidney disease)    COPD (chronic obstructive pulmonary disease) ([place])    Diabetes mellitus without complication ([place])    Hyperlipemia    Hypertension    Myocardial infarction ([place])    OSA on CPAP    Peripheral neuropathy    Past Surgical History:  Procedure Laterality Date   ABDOMINAL AORTIC ANEURYSM REPAIR     LUMBAR [place]     found spot on lung 10 years ago    Allergies  No Known Allergies  History of Present Illness    Samuel Watkins is a 76 y.o. male with a hx of CAD (BMS to RCA and DES to RCA 2001 and 2008, LAD DES 2008), PAD, DM2, COPD, hypertension, hyperlipidemia, AAA s/p repair, atrial fibrillation last seen chest pain.   ABIs performed November 2021 due to leg pain with right ABI normal range and left ABI with moderate left lower extremity arterial disease. Follows with [facility]. Echo 09/2021 LVEF 60 to 54%, grade 1 diastolic dysfunction, RV normal size and function, LA mildly dilated, trivial MR, mild dilation ascending aorta 40 mm.  SGLT2 was initiated due to dyspnea.  Last seen 11/11/2021 by Dr.Smith doing overall well from a cardiac perspective and recommended for follow-up in 9 to 12 months.  He presents today for follow-up independently.  Describes his chest pain as feeling like he  swallowed something and it got stuck but down in his chest instead of his throat.  And also episode Friday occurred while walking around the mall and lasted nearly all day with associated with lightheadedness but no near-syncope nor Notes exertional dyspnea with more than usual activity that has progressed. No orthopnea, PND. BP checked daily in the evening less than 130/80.  No formal exercise routine as he is limited by bilateral hip arthritis and previous orthopedic injuries from doing body work for 32 years.  EKGs/Labs/Other Studies Reviewed:   The following studies were reviewed today:  Echo 09/2021  1. Left ventricular ejection fraction, by estimation, is 60 to 65%. Left  ventricular ejection fraction by 3D volume is 61 %. The left ventricle has  normal function. The left ventricle has no regional wall motion  abnormalities. Left ventricular diastolic   parameters are consistent with Grade I diastolic dysfunction (impaired  relaxation).   2. Right ventricular systolic function is normal. The right ventricular  size is normal.   3. Left atrial size was mildly dilated.   4. The mitral valve is grossly normal. Trivial mitral valve  regurgitation.   5. The aortic valve is tricuspid. Aortic valve regurgitation is not  visualized.   6. Aortic dilatation noted. There is mild dilatation of the ascending  aorta,  measuring 40 mm.   7. The inferior vena cava is normal in size with greater than 50%  respiratory variability, suggesting right atrial pressure of 3 mmHg.  EKG:  EKG is ordered today.  The ekg ordered today demonstrates Atrial fibrillation 118 bpm.   Recent Labs: 10/08/2021: BUN 19; Creatinine, Ser 1.48; Potassium 4.0; Sodium 140  Recent Lipid Panel    Component Value Date/Time   CHOL 93 (L) 06/17/2019 1024   TRIG 79 06/17/2019 1024   HDL 30 (L) 06/17/2019 1024   CHOLHDL 3.1 06/17/2019 1024   CHOLHDL 10.5 07/10/2007 0345   VLDL 21 07/10/2007 0345   LDLCALC 47 06/17/2019 1024     Home Medications   Current Meds  Medication Sig   albuterol (PROVENTIL HFA;VENTOLIN HFA) 108 (90 BASE) MCG/ACT inhaler Inhale 2 puffs into the lungs 4 (four) times daily as needed for wheezing or shortness of breath.   amLODipine (NORVASC) 10 MG tablet Take 1 tablet (10 mg total) by mouth daily.   apixaban (ELIQUIS) 5 MG TABS tablet Take 1 tablet (5 mg total) by mouth 2 (two) times daily.   atorvastatin (LIPITOR) 80 MG tablet Take 80 mg by mouth daily.     budesonide-formoterol (SYMBICORT) 160-4.5 MCG/ACT inhaler Inhale 2 puffs into the lungs 2 (two) times daily.   carboxymethylcellulose (REFRESH PLUS) 0.5 % SOLN Place 1 drop into both eyes daily.   Cholecalciferol (VITAMIN D3) 5000 units CAPS Take 5,000 Units by mouth daily.   empagliflozin (JARDIANCE) 10 MG TABS tablet Take 1 tablet (10 mg total) by mouth daily.   gabapentin (NEURONTIN) 300 MG capsule Take 1 capsule by mouth at bedtime.   glimepiride (AMARYL) 2 MG tablet Take 2 mg by mouth daily with breakfast.   glucose blood (ACCU-CHEK SMARTVIEW) test strip See admin instructions.   losartan-hydrochlorothiazide (HYZAAR) 50-12.5 MG tablet Take 1 tablet by mouth daily.   Magnesium Oxide 420 MG TABS Take 2 tablets by mouth daily. EXCEPT Sundays   metFORMIN (GLUCOPHAGE) 500 MG tablet Take 1,000 mg by mouth 2 (two) times daily with a meal.    metoprolol succinate (TOPROL XL) 25 MG 24 hr tablet Take 1 tablet (25 mg total) by mouth daily.   niacin 500 MG tablet Take 1,000 mg by mouth at bedtime.   nitroGLYCERIN (NITROSTAT) 0.4 MG SL tablet Place 0.4 mg under the tongue every 5 (five) minutes x 3 doses as needed for chest pain (Call 911 at 3rd dose within 15 minutes.).   pantoprazole (PROTONIX) 40 MG tablet Take 40 mg by mouth daily.   pioglitazone (ACTOS) 30 MG tablet TAKE ONE-HALF TABLET BY MOUTH DAILY FOR DIABETES   Tiotropium Bromide-Olodaterol 2.5-2.5 MCG/ACT AERS Inhale 2 puffs into the lungs daily.   [DISCONTINUED] aspirin EC 81 MG  tablet Take 1 tablet (81 mg total) by mouth daily.     Review of Systems      All other systems reviewed and are otherwise negative except as noted above.  Physical Exam    VS:  BP 106/72   Pulse (!) 118   Ht '6\' 1"'$  (1.854 m)   Wt 226 lb (102.5 kg)   BMI 29.82 kg/m  , BMI Body mass index is 29.82 kg/m.  Wt Readings from Last 3 Encounters:  12/23/21 226 lb (102.5 kg)  11/11/21 229 lb 6.4 oz (104.1 kg)  09/13/21 235 lb (106.6 kg)    GEN: Well nourished, well developed, in no acute distress. HEENT: normal. Neck: Supple, no JVD, carotid bruits, or masses.  Cardiac: IRIR, no murmurs, rubs, or gallops. No clubbing, cyanosis, edema.  Radials/PT 2+ and equal bilaterally.  Respiratory:  Respirations regular and unlabored, clear to auscultation bilaterally. GI: Soft, nontender, nondistended. MS: No deformity or atrophy. Skin: Warm and dry, no rash. Neuro:  Strength and sensation are intact. Psych: Normal affect.  Assessment & Plan    Atrial fibrillation / hypercoagulable state -new onset atrial fibrillation 118 BPM today in clinic.  Echo 09/2021 with normal LVEF and only mild MR. TSH, CBC, CMP, magnesium to rule out thyroid abnormality, anemia or electrolyte as contributory. Endorses compliance with CPAP.  Start Toprol 25 mg daily.  Start Eliquis 5 mg twice daily.  30-day coupon as well as 1 week of samples provided in clinic.  CHA2DS2-VASc Score = 3 [CHF History: 0, HTN History: 1, Diabetes History: 0, Stroke History: 0, Vascular Disease History: 1, Age Score: 1, Gender Score: 0].  Therefore, the patient's annual risk of stroke is 3.2 %.    Initiated discussion regarding cardioversion.  Follow-up in 3 weeks with EKG to reassess rhythm.  OSA - Compliant with CPAP.   Dilation of ascending aorta -Echo 3/23 ascending aorta 40 mm.  Continue optimal blood pressure control.  Consider repeat imaging in 1 year.  CAD -reports feeling of something being stuck in his chest different from his anginal  equivalent.  Anticipate this is related to his atrial fibrillation.  No ischemic evaluation recommended.  Work-up and treatment of atrial fibrillation detailed above.  Due to need for [facility] we will stop aspirin.  Continue atorvastatin at current dose.  Initiate metoprolol as detailed above.  Hypertension - Bp well controlled in clinic. Relatively hypotensive though asymptomatic.  Discussed to monitor BP at home at least 2 hours after medications and sitting for 5-10 minutes.  He will contact office if BP consistently <110/60.  Hyperlipidemia, LDL goal less than 70 - Continue Atorvastatin '80mg'$  daily.  PAD - Follows with [facility].    Disposition: Follow up in 3 week(s) with Sinclair Grooms, MD or APP.  Signed, Loel Dubonnet, NP 12/23/2021, 1:45 PM [facility]

## 2021-12-24 DIAGNOSIS — I480 Paroxysmal atrial fibrillation: Secondary | ICD-10-CM | POA: Diagnosis not present

## 2021-12-25 LAB — COMPREHENSIVE METABOLIC PANEL
ALT: 23 IU/L (ref 0–44)
AST: 14 IU/L (ref 0–40)
Albumin/Globulin Ratio: 1.9 (ref 1.2–2.2)
Albumin: 4.3 g/dL (ref 3.7–4.7)
Alkaline Phosphatase: 64 IU/L (ref 44–121)
BUN/Creatinine Ratio: 16 (ref 10–24)
BUN: 23 mg/dL (ref 8–27)
Bilirubin Total: 1.5 mg/dL — ABNORMAL HIGH (ref 0.0–1.2)
CO2: 19 mmol/L — ABNORMAL LOW (ref 20–29)
Calcium: 9.5 mg/dL (ref 8.6–10.2)
Chloride: 103 mmol/L (ref 96–106)
Creatinine, Ser: 1.46 mg/dL — ABNORMAL HIGH (ref 0.76–1.27)
Globulin, Total: 2.3 g/dL (ref 1.5–4.5)
Glucose: 222 mg/dL — ABNORMAL HIGH (ref 70–99)
Potassium: 4.2 mmol/L (ref 3.5–5.2)
Sodium: 140 mmol/L (ref 134–144)
Total Protein: 6.6 g/dL (ref 6.0–8.5)
eGFR: 50 mL/min/{1.73_m2} — ABNORMAL LOW (ref >59)

## 2021-12-25 LAB — CBC
Hematocrit: 46.6 % (ref 37.5–51.0)
Hemoglobin: 16.3 g/dL (ref 13.0–17.7)
MCH: 32.2 pg (ref 26.6–33.0)
MCHC: 35 g/dL (ref 31.5–35.7)
MCV: 92 fL (ref 79–97)
Platelets: 148 10*3/uL — ABNORMAL LOW (ref 150–450)
RBC: 5.06 x10E6/uL (ref 4.14–5.80)
RDW: 13.2 % (ref 11.6–15.4)
WBC: 6.6 10*3/uL (ref 3.4–10.8)

## 2021-12-25 LAB — TSH: TSH: 1.9 u[IU]/mL (ref 0.450–4.500)

## 2021-12-25 LAB — MAGNESIUM: Magnesium: 2 mg/dL (ref 1.6–2.3)

## 2022-01-15 ENCOUNTER — Ambulatory Visit (HOSPITAL_BASED_OUTPATIENT_CLINIC_OR_DEPARTMENT_OTHER): Payer: Medicare HMO | Admitting: Family

## 2022-01-15 ENCOUNTER — Encounter (HOSPITAL_BASED_OUTPATIENT_CLINIC_OR_DEPARTMENT_OTHER): Payer: Self-pay | Admitting: Family

## 2022-01-15 VITALS — BP 110/74 | HR 85 | Ht 73.0 in | Wt 228.6 lb

## 2022-01-15 DIAGNOSIS — G4733 Obstructive sleep apnea (adult) (pediatric): Secondary | ICD-10-CM

## 2022-01-15 DIAGNOSIS — D6859 Other primary thrombophilia: Secondary | ICD-10-CM | POA: Diagnosis not present

## 2022-01-15 DIAGNOSIS — I480 Paroxysmal atrial fibrillation: Secondary | ICD-10-CM | POA: Diagnosis not present

## 2022-01-15 DIAGNOSIS — I10 Essential (primary) hypertension: Secondary | ICD-10-CM

## 2022-01-15 DIAGNOSIS — I739 Peripheral vascular disease, unspecified: Secondary | ICD-10-CM

## 2022-01-15 DIAGNOSIS — E785 Hyperlipidemia, unspecified: Secondary | ICD-10-CM | POA: Diagnosis not present

## 2022-01-15 DIAGNOSIS — I25118 Atherosclerotic heart disease of native coronary artery with other forms of angina pectoris: Secondary | ICD-10-CM

## 2022-01-15 LAB — CBC
Hematocrit: 46.3 % (ref 37.5–51.0)
Hemoglobin: 16.1 g/dL (ref 13.0–17.7)
MCH: 31.9 pg (ref 26.6–33.0)
MCHC: 34.8 g/dL (ref 31.5–35.7)
MCV: 92 fL (ref 79–97)
Platelets: 147 10*3/uL — ABNORMAL LOW (ref 150–450)
RBC: 5.04 x10E6/uL (ref 4.14–5.80)
RDW: 13.5 % (ref 11.6–15.4)
WBC: 7.6 10*3/uL (ref 3.4–10.8)

## 2022-01-15 MED ORDER — METOPROLOL SUCCINATE ER 25 MG PO TB24: 25.0000 mg | ORAL_TABLET | Freq: Every day | ORAL | 3 refills | Status: DC

## 2022-01-15 MED ORDER — APIXABAN 5 MG PO TABS: 5.0000 mg | ORAL_TABLET | Freq: Two times a day (BID) | ORAL | 3 refills | Status: DC

## 2022-01-15 NOTE — Addendum Note (Signed)
Addended by: Gerald Stabs on: 01/15/2022 02:07 PM   Modules accepted: Orders

## 2022-01-15 NOTE — Progress Notes (Signed)
Office Visit    Patient Name: Samuel Watkins Date of Encounter: 01/15/2022  PCP:  Lawerance Cruel, MD   [place]  Cardiologist:  Sinclair Grooms, MD  Advanced Practice Provider:  No care team member to display Electrophysiologist:  None     Chief Complaint    Samuel Watkins is a 76 y.o. male with a hx of CAD (BMS to RCA and DES to RCA 2001 and 2008, LAD DES 2008), PAD, DM2, COPD, hypertension, hyperlipidemia, AAA s/p repair, atrial fibrillation  presents today for chest discomfort   Past Medical History    Past Medical History:  Diagnosis Date   CAD (coronary artery disease)    Chronic hepatitis B ([place])    CKD (chronic kidney disease)    COPD (chronic obstructive pulmonary disease) ([place])    Diabetes mellitus without complication ([place])    Hyperlipemia    Hypertension    Myocardial infarction ([place])    OSA on CPAP    Peripheral neuropathy    Past Surgical History:  Procedure Laterality Date   ABDOMINAL AORTIC ANEURYSM REPAIR     LUMBAR [place]     found spot on lung 10 years ago    Allergies  No Known Allergies  History of Present Illness    Samuel Watkins is a 76 y.o. male with a hx of CAD (BMS to RCA and DES to RCA 2001 and 2008, LAD DES 2008), PAD, DM2, COPD, hypertension, hyperlipidemia, AAA s/p repair, atrial fibrillation last seen 12/23/2021   ABIs performed November 2021 due to leg pain with right ABI normal range and left ABI with moderate left lower extremity arterial disease. Follows with [facility]. Echo 09/2021 LVEF 60 to 56%, grade 1 diastolic dysfunction, RV normal size and function, LA mildly dilated, trivial MR, mild dilation ascending aorta 40 mm.  SGLT2 was initiated due to dyspnea.  Last seen 11/11/2021 by Dr.Smith doing overall well from a cardiac perspective and recommended for follow-up in 9 to 12 months.  At follow-up 12/23/2021 was found to be in atrial fibrillation symptomatic with chest discomfort  as if he swallowed something.  Aspirin stopped and Eliquis 5 mg twice daily started.  He was also started on Toprol 25 mg daily.  TSH, CMP, CBC only notable for mildly low platelets 148.  Presents today for follow up independently. Notes one tor two episodes of chest discomfort since last seen but not as bad. Occurred at rest and self resolved. No palpitations, dyspnea, orthopnea, PND. Tolerating Metoprolol and Eliquis. Home BP as low as 90s/60s but no lightheadedness dizziness. SBP most often 110s.  No formal exercise routine as he is limited by bilateral hip arthritis and previous orthopedic injuries from doing body work for 32 years.  EKGs/Labs/Other Studies Reviewed:   The following studies were reviewed today:  Echo 09/2021  1. Left ventricular ejection fraction, by estimation, is 60 to 65%. Left  ventricular ejection fraction by 3D volume is 61 %. The left ventricle has  normal function. The left ventricle has no regional wall motion  abnormalities. Left ventricular diastolic   parameters are consistent with Grade I diastolic dysfunction (impaired  relaxation).   2. Right ventricular systolic function is normal. The right ventricular  size is normal.   3. Left atrial size was mildly dilated.   4. The mitral valve is grossly normal. Trivial mitral valve  regurgitation.   5. The aortic valve is  tricuspid. Aortic valve regurgitation is not  visualized.   6. Aortic dilatation noted. There is mild dilatation of the ascending  aorta, measuring 40 mm.   7. The inferior vena cava is normal in size with greater than 50%  respiratory variability, suggesting right atrial pressure of 3 mmHg.   EKG:  EKG is ordered today.  The ekg ordered today demonstrates atrial fibrillation 85 bpm with no acute ST/T wave changes. Known RBBB.  Recent Labs: 12/24/2021: ALT 23; BUN 23; Creatinine, Ser 1.46; Hemoglobin 16.3; Magnesium 2.0; Platelets 148; Potassium 4.2; Sodium 140; TSH 1.900  Recent Lipid Panel     Component Value Date/Time   CHOL 93 (L) 06/17/2019 1024   TRIG 79 06/17/2019 1024   HDL 30 (L) 06/17/2019 1024   CHOLHDL 3.1 06/17/2019 1024   CHOLHDL 10.5 07/10/2007 0345   VLDL 21 07/10/2007 0345   LDLCALC 47 06/17/2019 1024    Home Medications   Current Meds  Medication Sig   albuterol (PROVENTIL HFA;VENTOLIN HFA) 108 (90 BASE) MCG/ACT inhaler Inhale 2 puffs into the lungs 4 (four) times daily as needed for wheezing or shortness of breath.   amLODipine (NORVASC) 10 MG tablet Take 1 tablet (10 mg total) by mouth daily.   apixaban (ELIQUIS) 5 MG TABS tablet Take 1 tablet (5 mg total) by mouth 2 (two) times daily.   atorvastatin (LIPITOR) 80 MG tablet Take 80 mg by mouth daily.     budesonide-formoterol (SYMBICORT) 160-4.5 MCG/ACT inhaler Inhale 2 puffs into the lungs 2 (two) times daily.   carboxymethylcellulose (REFRESH PLUS) 0.5 % SOLN Place 1 drop into both eyes daily.   Cholecalciferol (VITAMIN D3) 5000 units CAPS Take 5,000 Units by mouth daily.   empagliflozin (JARDIANCE) 10 MG TABS tablet Take 1 tablet (10 mg total) by mouth daily.   gabapentin (NEURONTIN) 300 MG capsule Take 1 capsule by mouth at bedtime.   glimepiride (AMARYL) 2 MG tablet Take 2 mg by mouth daily with breakfast.   glucose blood (ACCU-CHEK SMARTVIEW) test strip See admin instructions.   losartan-hydrochlorothiazide (HYZAAR) 50-12.5 MG tablet Take 1 tablet by mouth daily.   Magnesium Oxide 420 MG TABS Take 2 tablets by mouth daily. EXCEPT Sundays   metFORMIN (GLUCOPHAGE) 500 MG tablet Take 1,000 mg by mouth 2 (two) times daily with a meal.    metoprolol succinate (TOPROL XL) 25 MG 24 hr tablet Take 1 tablet (25 mg total) by mouth daily.   niacin 500 MG tablet Take 1,000 mg by mouth at bedtime.   nitroGLYCERIN (NITROSTAT) 0.4 MG SL tablet Place 0.4 mg under the tongue every 5 (five) minutes x 3 doses as needed for chest pain (Call 911 at 3rd dose within 15 minutes.).   pantoprazole (PROTONIX) 40 MG tablet  Take 40 mg by mouth daily.   pioglitazone (ACTOS) 30 MG tablet TAKE ONE-HALF TABLET BY MOUTH DAILY FOR DIABETES   Tiotropium Bromide-Olodaterol 2.5-2.5 MCG/ACT AERS Inhale 2 puffs into the lungs daily.     Review of Systems      All other systems reviewed and are otherwise negative except as noted above.  Physical Exam    VS:  BP 110/74 (BP Location: Left Arm, Patient Position: Sitting, Cuff Size: Normal)   Pulse 85   Ht '6\' 1"'$  (1.854 m)   Wt 228 lb 9.6 oz (103.7 kg)   BMI 30.16 kg/m  , BMI Body mass index is 30.16 kg/m.  Wt Readings from Last 3 Encounters:  01/15/22 228 lb 9.6 oz (103.7  kg)  12/23/21 226 lb (102.5 kg)  11/11/21 229 lb 6.4 oz (104.1 kg)    GEN: Well nourished, well developed, in no acute distress. HEENT: normal. Neck: Supple, no JVD, carotid bruits, or masses. Cardiac: IRIR, no murmurs, rubs, or gallops. No clubbing, cyanosis, edema.  Radials/PT 2+ and equal bilaterally.  Respiratory:  Respirations regular and unlabored, clear to auscultation bilaterally. GI: Soft, nontender, nondistended. MS: No deformity or atrophy. Skin: Warm and dry, no rash. RLE with scab to dorsal portion of food. No evidence of infection. Neuro:  Strength and sensation are intact. Psych: Normal affect.  Assessment & Plan    Atrial fibrillation / hypercoagulable state - new onset atrial fibrillation 12/23/21 started on Eliquis, Metoprolol.  Echo 09/2021 with normal LVEF and only mild MR. TSH, CBC, CMP, magnesium to rule out thyroid abnormality, anemia or electrolyte as contributory. Endorses compliance with CPAP.  CHA2DS2-VASc Score = 3 [CHF History: 0, HTN History: 1, Diabetes History: 0, Stroke History: 0, Vascular Disease History: 1, Age Score: 1, Gender Score: 0].  Therefore, the patient's annual risk of stroke is 3.2 %.    Again discussed cardioversion but he prefers to discuss with Dr. Tamala Julian. As rate control improved, no medication changes at this time.  Update CBC.  OSA - Compliant  with CPAP.   Dilation of ascending aorta -Echo 3/23 ascending aorta 40 mm.  Continue optimal blood pressure control.  Consider repeat imaging in 1 year.  CAD -Occasional chest discomfort more consistent with atrial fib with RVR - management detailed above. No ischemic evaluation recommended.  Work-up and treatment of atrial fibrillation detailed above.  GDMT Atorvastatin, Metoprolol. No ASA due to [facility]. Heart healthy diet and regular cardiovascular exercise encouraged.    Hypertension - Bp well controlled in clinic. Relatively hypotensive though asymptomatic.  Discussed to monitor BP at home at least 2 hours after medications and sitting for 5-10 minutes.  He will contact office if BP consistently <110/60. If noted, could reduce dose Amlodipine in future.   Hyperlipidemia, LDL goal less than 70 - Continue Atorvastatin '80mg'$  daily.  PAD - Follows with [facility].    Disposition: Follow up in 3 week(s) with Sinclair Grooms, MD to discuss cardioversion.  Signed, Loel Dubonnet, NP 01/15/2022, 1:31 PM [place]

## 2022-01-15 NOTE — Patient Instructions (Addendum)
Medication Instructions:   Continue your current medications.   If you notice lightheadedness or dizziness let us know and we may consider reducing your blood pressure medicine.   Loel Dubonnet, NP will send a note to Dr. Daiva Huge to ask for refills of Metoprolol and Eliquis to be sent to [facility]. If you are close to running out, please call and let us know. There are refills available at the [facility] in [place].   *If you need a refill on your cardiac medications before your next appointment, please call your pharmacy*   Lab Work: Your physician recommends that you return for lab work today: CBC  If you have labs (blood work) drawn today and your tests are completely normal, you will receive your results only by: MyChart Message (if you have [place]) OR A paper copy in the mail If you have any lab test that is abnormal or we need to change your treatment, we will call you to review the results.   Testing/Procedures: Your EKG today showed normal sinus rhythm.    Follow-Up: At [facility], you and your health needs are our priority.  As part of our continuing mission to provide you with exceptional heart care, we have created designated Provider Care Teams.  These Care Teams include your primary Cardiologist (physician) and Advanced Practice Providers (APPs -  Physician Assistants and Nurse Practitioners) who all work together to provide you with the care you need, when you need it.  We recommend signing up for the patient portal called "MyChart".  Sign up information is provided on this After Visit Summary.  MyChart is used to connect with patients for Virtual Visits (Telemedicine).  Patients are able to view lab/test results, encounter notes, upcoming appointments, etc.  Non-urgent messages can be sent to your provider as well.   To learn more about what you can do with MyChart, go to [email or web address].    Your next appointment:   As scheduled with Dr. Tamala Julian to discuss  cardioversion   Other Instructions  Electrical Cardioversion Electrical cardioversion is the delivery of a jolt of electricity to restore a normal rhythm to the heart. A rhythm that is too fast or is not regular keeps the heart from pumping well. In this procedure, sticky patches or metal paddles are placed on the chest to deliver electricity to the heart from a device. This procedure may be done in an emergency if: There is low or no blood pressure as a result of the heart rhythm. Normal rhythm must be restored as fast as possible to protect the brain and heart from further damage. It may save a life. This may also be a scheduled procedure for irregular or fast heart rhythms that are not immediately life-threatening. Tell a health care provider about: Any allergies you have. All medicines you are taking, including vitamins, herbs, eye drops, creams, and over-the-counter medicines. Any problems you or family members have had with anesthetic medicines. Any blood disorders you have. Any surgeries you have had. Any medical conditions you have. Whether you are pregnant or may be pregnant. What are the risks? Generally, this is a safe procedure. However, problems may occur, including: Allergic reactions to medicines. A blood clot that breaks free and travels to other parts of your body. The possible return of an abnormal heart rhythm within hours or days after the procedure. Your heart stopping (cardiac arrest). This is rare. What happens before the procedure? Medicines Your health care provider may have you start taking: Blood-thinning  medicines (anticoagulants) so your blood does not clot as easily. Medicines to help stabilize your heart rate and rhythm. Ask your health care provider about: Changing or stopping your regular medicines. This is especially important if you are taking diabetes medicines or blood thinners. Taking medicines such as aspirin and ibuprofen. These medicines can  thin your blood. Do not take these medicines unless your health care provider tells you to take them. Taking over-the-counter medicines, vitamins, herbs, and supplements. General instructions Follow instructions from your health care provider about eating or drinking restrictions. Plan to have someone take you home from the hospital or clinic. If you will be going home right after the procedure, plan to have someone with you for 24 hours. Ask your health care provider what steps will be taken to help prevent infection. These may include washing your skin with a germ-killing soap. What happens during the procedure?  An IV will be inserted into one of your veins. Sticky patches (electrodes) or metal paddles may be placed on your chest. You will be given a medicine to help you relax (sedative). An electrical shock will be delivered. The procedure may vary among health care providers and hospitals. What can I expect after the procedure? Your blood pressure, heart rate, breathing rate, and blood oxygen level will be monitored until you leave the hospital or clinic. Your heart rhythm will be watched to make sure it does not change. You may have some redness on the skin where the shocks were given. Follow these instructions at home: Do not drive for 24 hours if you were given a sedative during your procedure. Take over-the-counter and prescription medicines only as told by your health care provider. Ask your health care provider how to check your pulse. Check it often. Rest for 48 hours after the procedure or as told by your health care provider. Avoid or limit your caffeine use as told by your health care provider. Keep all follow-up visits as told by your health care provider. This is important. Contact a health care provider if: You feel like your heart is beating too quickly or your pulse is not regular. You have a serious muscle cramp that does not go away. Get help right away if: You have  discomfort in your chest. You are dizzy or you feel faint. You have trouble breathing or you are short of breath. Your speech is slurred. You have trouble moving an arm or leg on one side of your body. Your fingers or toes turn cold or blue. Summary Electrical cardioversion is the delivery of a jolt of electricity to restore a normal rhythm to the heart. This procedure may be done right away in an emergency or may be a scheduled procedure if the condition is not an emergency. Generally, this is a safe procedure. After the procedure, check your pulse often as told by your health care provider. This information is not intended to replace advice given to you by your health care provider. Make sure you discuss any questions you have with your health care provider. Document Revised: 05/30/2021 Document Reviewed: 01/31/2019 Elsevier Patient Education  [place].

## 2022-01-16 ENCOUNTER — Telehealth (HOSPITAL_BASED_OUTPATIENT_CLINIC_OR_DEPARTMENT_OTHER): Payer: Self-pay

## 2022-01-16 NOTE — Telephone Encounter (Addendum)
Results called to patient who verbalizes understanding!      ----- Message from Loel Dubonnet, NP sent at 01/16/2022  8:00 AM EDT ----- CBC with no evidence of anemia nor infection.  Platelet count overall stable. Continue current medications.

## 2022-02-01 NOTE — Progress Notes (Unsigned)
Cardiology Office Note:    Date:  02/03/2022   ID:  Samuel Watkins, DOB 1945-09-08, MRN [id number]  PCP:  Lawerance Cruel, MD  Cardiologist:  Sinclair Grooms, MD   Referring MD: Lawerance Cruel, MD   Chief Complaint  Patient presents with   Atrial Fibrillation   Coronary Artery Disease   Congestive Heart Failure    History of Present Illness:    Samuel Watkins is a 76 y.o. male with a hx of CAD (BMS to RCA and DES to RCA 2001 and 2008, LAD DES 2008), PAD, DM2, COPD, hypertension, hyperlipidemia, AAA s/p repair, and atrial fibrillation.   On 12/23/2021 noted to be in AF with CP. Toprol started along with Eliquis.  Presented 01/15/2022 for AF f/u to Samuel Watkins, and deferred cardioversion until he could speak to me.   He came to the office on June 12 after developing profound weakness and shortness of breath.  He was started on Eliquis and Toprol-XL.  He was documented to be in atrial fibrillation.  It was recommended that he undergo electrical cardioversion but he refused until he could talk it over with me.  He is doing better now than he was when it first started.  He is tolerating the medications well.  He has not had syncope but is still having exertional dyspnea and some difficulty with breathing if he lies completely flat.  He is not having angina.  There is no lower extremity swelling.  Past Medical History:  Diagnosis Date   CAD (coronary artery disease)    Chronic hepatitis B ([place])    CKD (chronic kidney disease)    COPD (chronic obstructive pulmonary disease) (HCC)    Diabetes mellitus without complication (HCC)    Hyperlipemia    Hypertension    Myocardial infarction (HCC)    OSA on CPAP    Peripheral neuropathy     Past Surgical History:  Procedure Laterality Date   ABDOMINAL AORTIC ANEURYSM REPAIR     LUMBAR [place]     found spot on lung 10 years ago    Current Medications: Current Meds  Medication Sig   albuterol  (PROVENTIL HFA;VENTOLIN HFA) 108 (90 BASE) MCG/ACT inhaler Inhale 2 puffs into the lungs 4 (four) times daily as needed for wheezing or shortness of breath.   amLODipine (NORVASC) 10 MG tablet Take 1 tablet (10 mg total) by mouth daily.   apixaban (ELIQUIS) 5 MG TABS tablet Take 1 tablet (5 mg total) by mouth 2 (two) times daily.   atorvastatin (LIPITOR) 80 MG tablet Take 80 mg by mouth daily.     budesonide-formoterol (SYMBICORT) 160-4.5 MCG/ACT inhaler Inhale 2 puffs into the lungs 2 (two) times daily.   carboxymethylcellulose (REFRESH PLUS) 0.5 % SOLN Place 1 drop into both eyes daily.   Cholecalciferol (VITAMIN D3) 5000 units CAPS Take 5,000 Units by mouth daily.   empagliflozin (JARDIANCE) 10 MG TABS tablet Take 1 tablet (10 mg total) by mouth daily.   gabapentin (NEURONTIN) 300 MG capsule Take 1 capsule by mouth at bedtime.   glimepiride (AMARYL) 2 MG tablet Take 2 mg by mouth daily with breakfast.   glucose blood (ACCU-CHEK SMARTVIEW) test strip See admin instructions.   losartan-hydrochlorothiazide (HYZAAR) 50-12.5 MG tablet Take 1 tablet by mouth daily.   Magnesium Oxide 420 MG TABS Take 2 tablets by mouth daily. EXCEPT Sundays   metFORMIN (GLUCOPHAGE) 500 MG tablet Take 1,000 mg by mouth  2 (two) times daily with a meal.    metoprolol succinate (TOPROL XL) 25 MG 24 hr tablet Take 1 tablet (25 mg total) by mouth daily.   niacin 500 MG tablet Take 1,000 mg by mouth at bedtime.   nitroGLYCERIN (NITROSTAT) 0.4 MG SL tablet Place 0.4 mg under the tongue every 5 (five) minutes x 3 doses as needed for chest pain (Call 911 at 3rd dose within 15 minutes.).   pantoprazole (PROTONIX) 40 MG tablet Take 40 mg by mouth daily.   pioglitazone (ACTOS) 30 MG tablet TAKE ONE-HALF TABLET BY MOUTH DAILY FOR DIABETES   Tiotropium Bromide-Olodaterol 2.5-2.5 MCG/ACT AERS Inhale 2 puffs into the lungs daily.     Allergies:   Patient has no known allergies.   Social History   Socioeconomic History    Marital status: Married    Spouse name: Samuel Watkins   Number of children: 3   Years of education: 12   Highest education level: Not on file  Occupational History   Occupation: retired  Tobacco Use   Smoking status: Former    Packs/day: 1.00    Years: 50.00    Total pack years: 50.00    Types: Cigarettes    Quit date: 07/14/2010    Years since quitting: 11.5   Smokeless tobacco: Never   Tobacco comments:    Counseled to remain smoke free.  Vaping Use   Vaping Use: Never used  Substance and Sexual Activity   Alcohol use: No    Alcohol/week: 0.0 standard drinks of alcohol   Drug use: No   Sexual activity: Not on file  Other Topics Concern   Not on file  Social History Narrative   Lives with wife   Caffeine- coffee 2 c, soda 1, tea 1   Social Determinants of Health   Financial Resource Strain: Not on file  Food Insecurity: Not on file  Transportation Needs: Not on file  Physical Activity: Not on file  Stress: Not on file  Social Connections: Not on file     Family History: The patient's family history includes Heart disease in his father and mother; Throat cancer in his brother.  ROS:   Please see the history of present illness.    No bleeding on Eliquis.  Toprol is low-dose at 25 mg/day.  Not on any other therapy to slow heart rate.  All other systems reviewed and are negative.  EKGs/Labs/Other Studies Reviewed:    The following studies were reviewed today:  ECHOCARDIOGRAPHY 09/2021: IMPRESSIONS   1. Left ventricular ejection fraction, by estimation, is 60 to 65%. Left  ventricular ejection fraction by 3D volume is 61 %. The left ventricle has  normal function. The left ventricle has no regional wall motion  abnormalities. Left ventricular diastolic   parameters are consistent with Grade I diastolic dysfunction (impaired  relaxation).   2. Right ventricular systolic function is normal. The right ventricular  size is normal.   3. Left atrial size was mildly dilated.    4. The mitral valve is grossly normal. Trivial mitral valve  regurgitation.   5. The aortic valve is tricuspid. Aortic valve regurgitation is not  visualized.   6. Aortic dilatation noted. There is mild dilatation of the ascending  aorta, measuring 40 mm.   7. The inferior vena cava is normal in size with greater than 50%  respiratory variability, suggesting right atrial pressure of 3 mmHg.   Comparison(s): No prior Echocardiogram.   EKG:  EKG 01/15/2022 AF, RBBB, and  RAD.  A new tracing is not repeated today.  Recent Labs: 12/24/2021: ALT 23; BUN 23; Creatinine, Ser 1.46; Magnesium 2.0; Potassium 4.2; Sodium 140; TSH 1.900 01/15/2022: Hemoglobin 16.1; Platelets 147  Recent Lipid Panel    Component Value Date/Time   CHOL 93 (L) 06/17/2019 1024   TRIG 79 06/17/2019 1024   HDL 30 (L) 06/17/2019 1024   CHOLHDL 3.1 06/17/2019 1024   CHOLHDL 10.5 07/10/2007 0345   VLDL 21 07/10/2007 0345   LDLCALC 47 06/17/2019 1024    Physical Exam:    VS:  BP 100/72   Pulse 84   Ht '6\' 1"'$  (1.854 m)   Wt 226 lb 9.6 oz (102.8 kg)   SpO2 95%   BMI 29.90 kg/m     Wt Readings from Last 3 Encounters:  02/03/22 226 lb 9.6 oz (102.8 kg)  01/15/22 228 lb 9.6 oz (103.7 kg)  12/23/21 226 lb (102.5 kg)     GEN: Overweight. No acute distress HEENT: Normal NECK: No JVD. LYMPHATICS: No lymphadenopathy CARDIAC: No murmur.  Irregularly irregular RR.  No gallop, or edema. VASCULAR:  Normal Pulses. No bruits. RESPIRATORY:  Clear to auscultation without rales, wheezing or rhonchi  ABDOMEN: Soft, non-tender, non-distended, No pulsatile mass, MUSCULOSKELETAL: No deformity  SKIN: Warm and dry NEUROLOGIC:  Alert and oriented x 3 PSYCHIATRIC:  Normal affect   ASSESSMENT:    1. PAF (paroxysmal atrial fibrillation) ([place])   2. Hypercoagulable state ([place])   3. PAD (peripheral artery disease) ([place])   4. OSA (obstructive sleep apnea)   5. Coronary artery disease of native artery of native heart with stable  angina pectoris ([place])   6. Hyperlipidemia LDL goal <70   7. Essential hypertension   8. Controlled type 2 diabetes mellitus with other circulatory complication, without long-term current use of insulin (HCC)    PLAN:    In order of problems listed above:  He is in continuous atrial fibrillation since mid June with decreased energy and heart failure symptoms.  No angina.  According to his wife he has not been able to do things to help around the house because he gives out.  He feels somewhat better with low-dose metoprolol on board.  He has not had a continuous monitor performed.  He has been on Eliquis 5 mg twice daily continuously since 13 June.  Elective electrical cardioversion discussed with the patient and he accepts. Continue Eliquis 5 mg twice daily Not discussed He is compliant with CPAP. No angina despite A-fib.  Continue current preventive therapy regimen Continue niacin and Lipitor. Blood pressure is relatively low.  Continue current therapy including amlodipine, Controlled by primary care, Dr. Nicolette Bang at the [facility].         The natural history of atrial fibrillation was discussed.  The inability to cure and the possibility of recurrences was clearly stated.  Management strategies including rhythm control with antiarrhythmic therapy and/or ablation,  rate control (beta-blocker therapy, digoxin, or AV node blocking CCB therapy), and  occasional need for pacemaker therapy were reviewed.  Stroke risk (as determined by CHADS VASC score >1). The requirement for and the duration/permanence of anti-coagulation therapy will be determined by context of individual risk factors and burden of AF.  Anticoagulation risks (vitamin K antagonists v DOAC therapy) were reviewed, highlighting the lower bleeding ris'@10RELATIVEDAYS'$ @ k and improved safety with DOAC's. Shared decision making was stressed.  Electrical cardioversion was discussed.  Risk of skin burn, airway trauma,  congestive heart failure, mechanical  injury, among other complications were discussed and accepted by the patient.   We will make an appeal to the [facility] to cover his new therapy related to atrial fibrillation.  2-week follow-up after cardioversion in either the A-fib clinic or back here at [facility] with Dr. Daneen Schick.  Medication Adjustments/Labs and Tests Ordered: Current medicines are reviewed at length with the patient today.  Concerns regarding medicines are outlined above.  No orders of the defined types were placed in this encounter.  No orders of the defined types were placed in this encounter.   There are no Patient Instructions on file for this visit.   Signed, Sinclair Grooms, MD  02/03/2022 3:10 PM    [place]

## 2022-02-03 ENCOUNTER — Ambulatory Visit: Payer: Medicare HMO | Admitting: Interventional Cardiology

## 2022-02-03 ENCOUNTER — Encounter: Payer: Self-pay | Admitting: Interventional Cardiology

## 2022-02-03 VITALS — BP 100/72 | HR 84 | Ht 73.0 in | Wt 226.6 lb

## 2022-02-03 DIAGNOSIS — G4733 Obstructive sleep apnea (adult) (pediatric): Secondary | ICD-10-CM | POA: Diagnosis not present

## 2022-02-03 DIAGNOSIS — I739 Peripheral vascular disease, unspecified: Secondary | ICD-10-CM | POA: Diagnosis not present

## 2022-02-03 DIAGNOSIS — E1159 Type 2 diabetes mellitus with other circulatory complications: Secondary | ICD-10-CM

## 2022-02-03 DIAGNOSIS — I25118 Atherosclerotic heart disease of native coronary artery with other forms of angina pectoris: Secondary | ICD-10-CM

## 2022-02-03 DIAGNOSIS — E785 Hyperlipidemia, unspecified: Secondary | ICD-10-CM

## 2022-02-03 DIAGNOSIS — I480 Paroxysmal atrial fibrillation: Secondary | ICD-10-CM

## 2022-02-03 DIAGNOSIS — D6859 Other primary thrombophilia: Secondary | ICD-10-CM

## 2022-02-03 DIAGNOSIS — I10 Essential (primary) hypertension: Secondary | ICD-10-CM | POA: Diagnosis not present

## 2022-02-03 NOTE — Patient Instructions (Addendum)
Medication Instructions:  Your physician recommends that you continue on your current medications as directed. Please refer to the Current Medication list given to you today.  *If you need a refill on your cardiac medications before your next appointment, please call your pharmacy*  Lab Work: NONE  Testing/Procedures: Your physician has recommended that you have a Cardioversion (DCCV). Electrical Cardioversion uses a jolt of electricity to your heart either through paddles or wired patches attached to your chest. This is a controlled, usually prescheduled, procedure. Defibrillation is done under light anesthesia in the hospital, and you usually go home the day of the procedure. This is done to get your heart back into a normal rhythm. You are not awake for the procedure. Please see the instruction sheet given to you today.   Follow-Up: At [facility], you and your health needs are our priority.  As part of our continuing mission to provide you with exceptional heart care, we have created designated Provider Care Teams.  These Care Teams include your primary Cardiologist (physician) and Advanced Practice Providers (APPs -  Physician Assistants and Nurse Practitioners) who all work together to provide you with the care you need, when you need it.  Your next appointment:   3 week(s)  The format for your next appointment:   In Person  Provider:   Sinclair Grooms, MD or Atrial Fibrillation Clinic  Other Instructions You are scheduled for a Cardioversion on  Friday February 14, 2022 with Dr. Stanford Breed.  Please arrive at the [facility] (Main Entrance A) at [facility]: [address] [place], [place] [address] at  09:30AM (1 hour prior to procedure unless lab work is needed; if lab work is needed arrive 1.5 hours ahead)   DIET: Nothing to eat or drink after midnight except a sip of water with medications (see medication instructions below)   FYI: For your safety, and to allow [place] to  monitor your vital signs accurately during the surgery/procedure we request that    if you have artificial nails, gel coating, SNS etc. Please have those removed prior to your surgery/procedure. Not having the nail coverings /polish removed may result in cancellation or delay of your surgery/procedure.    Medication Instructions:  Continue your anticoagulant:  You will need to continue your anticoagulant after your procedure until you are told by your  Provider that it is safe to stop   You must have a responsible person to drive you home and stay in the waiting area during your procedure. Failure to do so could result in cancellation.   Bring your insurance cards.   *Special Note: Every effort is made to have your procedure done on time. Occasionally there are emergencies that occur at the hospital that may cause delays. Please be patient if a delay does occur.       Important Information About Sugar

## 2022-02-07 ENCOUNTER — Encounter (HOSPITAL_COMMUNITY): Payer: Self-pay | Admitting: Cardiology

## 2022-02-12 ENCOUNTER — Telehealth: Payer: Self-pay | Admitting: Interventional Cardiology

## 2022-02-12 NOTE — Telephone Encounter (Signed)
Patient wants to get his medication filled though the [place], they called needing the labs and OV notes regarding Eliquis and metoprolol succinate faxed to them at [phone].

## 2022-02-13 DIAGNOSIS — I4891 Unspecified atrial fibrillation: Secondary | ICD-10-CM | POA: Diagnosis present

## 2022-02-14 ENCOUNTER — Ambulatory Visit (HOSPITAL_COMMUNITY)
Admission: RE | Admit: 2022-02-14 | Discharge: 2022-02-14 | Disposition: A | Discharge status: Home / Self Care | Payer: Medicare HMO | Attending: Cardiology | Admitting: Cardiology

## 2022-02-14 ENCOUNTER — Ambulatory Visit (HOSPITAL_COMMUNITY): Payer: Medicare HMO | Admitting: Anesthesiology

## 2022-02-14 ENCOUNTER — Ambulatory Visit (HOSPITAL_BASED_OUTPATIENT_CLINIC_OR_DEPARTMENT_OTHER): Payer: Medicare HMO | Admitting: Anesthesiology

## 2022-02-14 ENCOUNTER — Other Ambulatory Visit: Payer: Self-pay

## 2022-02-14 ENCOUNTER — Encounter (HOSPITAL_COMMUNITY): Payer: Self-pay | Admitting: Cardiology

## 2022-02-14 ENCOUNTER — Encounter (HOSPITAL_COMMUNITY)
Admission: RE | Disposition: A | Discharge status: Home / Self Care | Payer: Self-pay | Source: Home / Self Care | Attending: Cardiology

## 2022-02-14 DIAGNOSIS — Z87891 Personal history of nicotine dependence: Secondary | ICD-10-CM | POA: Diagnosis not present

## 2022-02-14 DIAGNOSIS — N189 Chronic kidney disease, unspecified: Secondary | ICD-10-CM | POA: Diagnosis not present

## 2022-02-14 DIAGNOSIS — I4891 Unspecified atrial fibrillation: Secondary | ICD-10-CM | POA: Diagnosis present

## 2022-02-14 DIAGNOSIS — I25118 Atherosclerotic heart disease of native coronary artery with other forms of angina pectoris: Secondary | ICD-10-CM | POA: Diagnosis not present

## 2022-02-14 DIAGNOSIS — I480 Paroxysmal atrial fibrillation: Secondary | ICD-10-CM | POA: Diagnosis not present

## 2022-02-14 DIAGNOSIS — E1122 Type 2 diabetes mellitus with diabetic chronic kidney disease: Secondary | ICD-10-CM | POA: Diagnosis not present

## 2022-02-14 DIAGNOSIS — J449 Chronic obstructive pulmonary disease, unspecified: Secondary | ICD-10-CM | POA: Diagnosis not present

## 2022-02-14 DIAGNOSIS — E1151 Type 2 diabetes mellitus with diabetic peripheral angiopathy without gangrene: Secondary | ICD-10-CM | POA: Insufficient documentation

## 2022-02-14 DIAGNOSIS — D6859 Other primary thrombophilia: Secondary | ICD-10-CM | POA: Insufficient documentation

## 2022-02-14 DIAGNOSIS — G4733 Obstructive sleep apnea (adult) (pediatric): Secondary | ICD-10-CM | POA: Diagnosis not present

## 2022-02-14 DIAGNOSIS — I4819 Other persistent atrial fibrillation: Secondary | ICD-10-CM

## 2022-02-14 DIAGNOSIS — I10 Essential (primary) hypertension: Secondary | ICD-10-CM | POA: Insufficient documentation

## 2022-02-14 DIAGNOSIS — Z7901 Long term (current) use of anticoagulants: Secondary | ICD-10-CM | POA: Insufficient documentation

## 2022-02-14 DIAGNOSIS — I252 Old myocardial infarction: Secondary | ICD-10-CM | POA: Diagnosis not present

## 2022-02-14 DIAGNOSIS — I2510 Atherosclerotic heart disease of native coronary artery without angina pectoris: Secondary | ICD-10-CM | POA: Diagnosis not present

## 2022-02-14 DIAGNOSIS — E785 Hyperlipidemia, unspecified: Secondary | ICD-10-CM | POA: Diagnosis not present

## 2022-02-14 DIAGNOSIS — Z79899 Other long term (current) drug therapy: Secondary | ICD-10-CM | POA: Insufficient documentation

## 2022-02-14 DIAGNOSIS — I129 Hypertensive chronic kidney disease with stage 1 through stage 4 chronic kidney disease, or unspecified chronic kidney disease: Secondary | ICD-10-CM | POA: Diagnosis not present

## 2022-02-14 HISTORY — PX: CARDIOVERSION: SHX1299

## 2022-02-14 LAB — POCT I-STAT, CHEM 8
BUN: 26 mg/dL — ABNORMAL HIGH (ref 8–23)
Calcium, Ion: 1.17 mmol/L (ref 1.15–1.40)
Chloride: 105 mmol/L (ref 98–111)
Creatinine, Ser: 1.3 mg/dL — ABNORMAL HIGH (ref 0.61–1.24)
Glucose, Bld: 182 mg/dL — ABNORMAL HIGH (ref 70–99)
HCT: 47 % (ref 39.0–52.0)
Hemoglobin: 16 g/dL (ref 13.0–17.0)
Potassium: 4.7 mmol/L (ref 3.5–5.1)
Sodium: 140 mmol/L (ref 135–145)
TCO2: 24 mmol/L (ref 22–32)

## 2022-02-14 SURGERY — CARDIOVERSION
Anesthesia: General

## 2022-02-14 MED ORDER — SODIUM CHLORIDE 0.9 % IV SOLN: INTRAVENOUS | Status: DC

## 2022-02-14 MED ORDER — SODIUM CHLORIDE 0.45 % IV SOLN: INTRAVENOUS | Status: DC

## 2022-02-14 MED ORDER — PROPOFOL 10 MG/ML IV BOLUS
INTRAVENOUS | Status: DC | PRN
  Administered 2022-02-14: 60 mg via INTRAVENOUS

## 2022-02-14 MED ORDER — LIDOCAINE 2% (20 MG/ML) 5 ML SYRINGE
INTRAMUSCULAR | Status: DC | PRN
  Administered 2022-02-14: 60 mg via INTRAVENOUS

## 2022-02-14 MED ORDER — APIXABAN 5 MG PO TABS
5.0000 mg | ORAL_TABLET | Freq: Once | ORAL | Status: AC
Start: 2022-02-14 — End: 2022-02-14
  Administered 2022-02-14: 5 mg via ORAL
  Filled 2022-02-14: qty 1

## 2022-02-14 NOTE — H&P (Signed)
Office Visit  02/03/2022 [facility], Lynnell Dike, MD Cardiology PAF (paroxysmal atrial fibrillation) [facility]) +7 more Dx Atrial Fibrillation  Coronary Artery Disease  Congestive Heart Failure; Referred by Lawerance Cruel, MD Reason for Visit   Additional Documentation  Vitals:  BP 100/72 Pulse 84 Ht '6\' 1"'$  (1.854 m) Wt 102.8 kg SpO2 95% BMI 29.90 kg/m BSA 2.3 m  More Vitals  Flowsheets:  NEWS, MEWS Score, Anthropometrics, Method of Visit   Encounter Info:  Billing Info, History, Allergies, Detailed Report    All Notes    Progress Notes by Belva Crome, MD at 02/03/2022 2:40 PM  Author: Belva Crome, MD Author Type: Physician Filed: 02/03/2022  3:31 PM  Note Status: Signed Cosign: Cosign Not Required Encounter Date: 02/03/2022  Editor: Belva Crome, MD (Physician)             Expand AllCollapse All    Cardiology Office Note:     Date:  02/03/2022    ID:  Samuel Watkins, DOB 1946/05/24, MRN [id number]   PCP:  Lawerance Cruel, MD           Cardiologist:  Sinclair Grooms, MD    Referring MD: Lawerance Cruel, MD       Chief Complaint  Patient presents with   Atrial Fibrillation   Coronary Artery Disease   Congestive Heart Failure      History of Present Illness:     Samuel Watkins is a 76 y.o. male with a hx of CAD (BMS to RCA and DES to RCA 2001 and 2008, LAD DES 2008), PAD, DM2, COPD, hypertension, hyperlipidemia, AAA s/p repair, and atrial fibrillation.    On 12/23/2021 noted to be in AF with CP. Toprol started along with Eliquis.   Presented 01/15/2022 for AF f/u to Laurann [place], and deferred cardioversion until he could speak to me.     He came to the office on June 12 after developing profound weakness and shortness of breath.  He was started on Eliquis and Toprol-XL.  He was documented to be in atrial fibrillation.  It was recommended that he undergo electrical cardioversion but he refused until he could talk it  over with me.  He is doing better now than he was when it first started.  He is tolerating the medications well.  He has not had syncope but is still having exertional dyspnea and some difficulty with breathing if he lies completely flat.  He is not having angina.  There is no lower extremity swelling.       Past Medical History:  Diagnosis Date   CAD (coronary artery disease)     Chronic hepatitis B ([place])     CKD (chronic kidney disease)     COPD (chronic obstructive pulmonary disease) (HCC)     Diabetes mellitus without complication (HCC)     Hyperlipemia     Hypertension     Myocardial infarction ([place])     OSA on CPAP     Peripheral neuropathy             Past Surgical History:  Procedure Laterality Date   ABDOMINAL AORTIC ANEURYSM REPAIR       LUMBAR [place]        found spot on lung 10 years ago      Current Medications: Active Medications      Current Meds  Medication Sig  albuterol (PROVENTIL HFA;VENTOLIN HFA) 108 (90 BASE) MCG/ACT inhaler Inhale 2 puffs into the lungs 4 (four) times daily as needed for wheezing or shortness of breath.   amLODipine (NORVASC) 10 MG tablet Take 1 tablet (10 mg total) by mouth daily.   apixaban (ELIQUIS) 5 MG TABS tablet Take 1 tablet (5 mg total) by mouth 2 (two) times daily.   atorvastatin (LIPITOR) 80 MG tablet Take 80 mg by mouth daily.     budesonide-formoterol (SYMBICORT) 160-4.5 MCG/ACT inhaler Inhale 2 puffs into the lungs 2 (two) times daily.   carboxymethylcellulose (REFRESH PLUS) 0.5 % SOLN Place 1 drop into both eyes daily.   Cholecalciferol (VITAMIN D3) 5000 units CAPS Take 5,000 Units by mouth daily.   empagliflozin (JARDIANCE) 10 MG TABS tablet Take 1 tablet (10 mg total) by mouth daily.   gabapentin (NEURONTIN) 300 MG capsule Take 1 capsule by mouth at bedtime.   glimepiride (AMARYL) 2 MG tablet Take 2 mg by mouth daily with breakfast.   glucose blood (ACCU-CHEK SMARTVIEW) test strip See admin  instructions.   losartan-hydrochlorothiazide (HYZAAR) 50-12.5 MG tablet Take 1 tablet by mouth daily.   Magnesium Oxide 420 MG TABS Take 2 tablets by mouth daily. EXCEPT Sundays   metFORMIN (GLUCOPHAGE) 500 MG tablet Take 1,000 mg by mouth 2 (two) times daily with a meal.    metoprolol succinate (TOPROL XL) 25 MG 24 hr tablet Take 1 tablet (25 mg total) by mouth daily.   niacin 500 MG tablet Take 1,000 mg by mouth at bedtime.   nitroGLYCERIN (NITROSTAT) 0.4 MG SL tablet Place 0.4 mg under the tongue every 5 (five) minutes x 3 doses as needed for chest pain (Call 911 at 3rd dose within 15 minutes.).   pantoprazole (PROTONIX) 40 MG tablet Take 40 mg by mouth daily.   pioglitazone (ACTOS) 30 MG tablet TAKE ONE-HALF TABLET BY MOUTH DAILY FOR DIABETES   Tiotropium Bromide-Olodaterol 2.5-2.5 MCG/ACT AERS Inhale 2 puffs into the lungs daily.        Allergies:   Patient has no known allergies.    Social History         Socioeconomic History   Marital status: Married      Spouse name: [name]   Number of children: 3   Years of education: 12   Highest education level: Not on file  Occupational History   Occupation: retired  Tobacco Use   Smoking status: Former      Packs/day: 1.00      Years: 50.00      Total pack years: 50.00      Types: Cigarettes      Quit date: 07/14/2010      Years since quitting: 11.5   Smokeless tobacco: Never   Tobacco comments:      Counseled to remain smoke free.  Vaping Use   Vaping Use: Never used  Substance and Sexual Activity   Alcohol use: No      Alcohol/week: 0.0 standard drinks of alcohol   Drug use: No   Sexual activity: Not on file  Other Topics Concern   Not on file  Social History Narrative    Lives with wife    Caffeine- coffee 2 c, soda 1, tea 1    Social Determinants of Health    Financial Resource Strain: Not on file  Food Insecurity: Not on file  Transportation Needs: Not on file  Physical Activity: Not on file  Stress: Not on  file  Social Connections: Not on  file      Family History: The patient's family history includes Heart disease in his father and mother; Throat cancer in his brother.   ROS:   Please see the history of present illness.    No bleeding on Eliquis.  Toprol is low-dose at 25 mg/day.  Not on any other therapy to slow heart rate.  All other systems reviewed and are negative.   EKGs/Labs/Other Studies Reviewed:     The following studies were reviewed today:   ECHOCARDIOGRAPHY 09/2021: IMPRESSIONS   1. Left ventricular ejection fraction, by estimation, is 60 to 65%. Left  ventricular ejection fraction by 3D volume is 61 %. The left ventricle has  normal function. The left ventricle has no regional wall motion  abnormalities. Left ventricular diastolic   parameters are consistent with Grade I diastolic dysfunction (impaired  relaxation).   2. Right ventricular systolic function is normal. The right ventricular  size is normal.   3. Left atrial size was mildly dilated.   4. The mitral valve is grossly normal. Trivial mitral valve  regurgitation.   5. The aortic valve is tricuspid. Aortic valve regurgitation is not  visualized.   6. Aortic dilatation noted. There is mild dilatation of the ascending  aorta, measuring 40 mm.   7. The inferior vena cava is normal in size with greater than 50%  respiratory variability, suggesting right atrial pressure of 3 mmHg.   Comparison(s): No prior Echocardiogram.    EKG:  EKG 01/15/2022 AF, RBBB, and RAD.  A new tracing is not repeated today.   Recent Labs: 12/24/2021: ALT 23; BUN 23; Creatinine, Ser 1.46; Magnesium 2.0; Potassium 4.2; Sodium 140; TSH 1.900 01/15/2022: Hemoglobin 16.1; Platelets 147  Recent Lipid Panel Labs (Brief)          Component Value Date/Time    CHOL 93 (L) 06/17/2019 1024    TRIG 79 06/17/2019 1024    HDL 30 (L) 06/17/2019 1024    CHOLHDL 3.1 06/17/2019 1024    CHOLHDL 10.5 07/10/2007 0345    VLDL 21 07/10/2007 0345     LDLCALC 47 06/17/2019 1024        Physical Exam:     VS:  BP 100/72   Pulse 84   Ht '6\' 1"'$  (1.854 m)   Wt 226 lb 9.6 oz (102.8 kg)   SpO2 95%   BMI 29.90 kg/m         Wt Readings from Last 3 Encounters:  02/03/22 226 lb 9.6 oz (102.8 kg)  01/15/22 228 lb 9.6 oz (103.7 kg)  12/23/21 226 lb (102.5 kg)      GEN: Overweight. No acute distress HEENT: Normal NECK: No JVD. LYMPHATICS: No lymphadenopathy CARDIAC: No murmur.  Irregularly irregular RR.  No gallop, or edema. VASCULAR:  Normal Pulses. No bruits. RESPIRATORY:  Clear to auscultation without rales, wheezing or rhonchi  ABDOMEN: Soft, non-tender, non-distended, No pulsatile mass, MUSCULOSKELETAL: No deformity  SKIN: Warm and dry NEUROLOGIC:  Alert and oriented x 3 PSYCHIATRIC:  Normal affect    ASSESSMENT:     1. PAF (paroxysmal atrial fibrillation) ([place])   2. Hypercoagulable state ([place])   3. PAD (peripheral artery disease) ([place])   4. OSA (obstructive sleep apnea)   5. Coronary artery disease of native artery of native heart with stable angina pectoris ([place])   6. Hyperlipidemia LDL goal <70   7. Essential hypertension   8. Controlled type 2 diabetes mellitus with other circulatory complication, without long-term current use of insulin (  [facility])     PLAN:     In order of problems listed above:   He is in continuous atrial fibrillation since mid June with decreased energy and heart failure symptoms.  No angina.  According to his wife he has not been able to do things to help around the house because he gives out.  He feels somewhat better with low-dose metoprolol on board.  He has not had a continuous monitor performed.  He has been on Eliquis 5 mg twice daily continuously since 13 June.  Elective electrical cardioversion discussed with the patient and he accepts. Continue Eliquis 5 mg twice daily Not discussed He is compliant with CPAP. No angina despite A-fib.  Continue current preventive therapy regimen Continue  niacin and Lipitor. Blood pressure is relatively low.  Continue current therapy including amlodipine, Controlled by primary care, Dr. Nicolette Bang at the [facility].           The natural history of atrial fibrillation was discussed.  The inability to cure and the possibility of recurrences was clearly stated.  Management strategies including rhythm control with antiarrhythmic therapy and/or ablation,  rate control (beta-blocker therapy, digoxin, or AV node blocking CCB therapy), and  occasional need for pacemaker therapy were reviewed.  Stroke risk (as determined by CHADS VASC score >1). The requirement for and the duration/permanence of anti-coagulation therapy will be determined by context of individual risk factors and burden of AF.  Anticoagulation risks (vitamin K antagonists v DOAC therapy) were reviewed, highlighting the lower bleeding ris'@10RELATIVEDAYS'$ @ k and improved safety with DOAC's. Shared decision making was stressed.   Electrical cardioversion was discussed.  Risk of skin burn, airway trauma, congestive heart failure, mechanical injury, among other complications were discussed and accepted by the patient.     We will make an appeal to the [facility] to cover his new therapy related to atrial fibrillation.   2-week follow-up after cardioversion in either the A-fib clinic or back here at [facility] with Dr. Daneen Schick.   Medication Adjustments/Labs and Tests Ordered: Current medicines are reviewed at length with the patient today.  Concerns regarding medicines are outlined above.  No orders of the defined types were placed in this encounter.   No orders of the defined types were placed in this encounter.     There are no Patient Instructions on file for this visit.    Signed, Sinclair Grooms, MD  02/03/2022 3:10 PM    [facility]       For DCCV; compliant with apixaban; no changes. [name]

## 2022-02-14 NOTE — Interval H&P Note (Signed)
History and Physical Interval Note:  02/14/2022 8:54 AM  Samuel Watkins  has presented today for surgery, with the diagnosis of Atrial fibrillation.  The various methods of treatment have been discussed with the patient and family. After consideration of risks, benefits and other options for treatment, the patient has consented to  Procedure(s): CARDIOVERSION (N/A) as a surgical intervention.  The patient's history has been reviewed, patient examined, no change in status, stable for surgery.  I have reviewed the patient's chart and labs.  Questions were answered to the patient's satisfaction.     [name]

## 2022-02-14 NOTE — Procedures (Signed)
Electrical Cardioversion Procedure Note Samuel Watkins [id number] 1946/04/26  Procedure: Electrical Cardioversion Indications:  Atrial Fibrillation  Procedure Details Consent: Risks of procedure as well as the alternatives and risks of each were explained to the (patient/caregiver).  Consent for procedure obtained. Time Out: Verified patient identification, verified procedure, site/side was marked, verified correct patient position, special equipment/implants available, medications/allergies/relevent history reviewed, required imaging and test results available.  Performed  Patient placed on cardiac monitor, pulse oximetry, supplemental oxygen as necessary.  Sedation given:  Pt sedated by anesthesia with lidocaine 60 mg and diprovan 60 mg IV. Pacer pads placed anterior and posterior chest.  Cardioverted 1 time(s).  Cardioverted at [place].  Evaluation Findings: Post procedure EKG shows: NSR Complications: None Patient did tolerate procedure well.   [name] 02/14/2022, 8:53 AM

## 2022-02-14 NOTE — Anesthesia Preprocedure Evaluation (Signed)
Anesthesia Evaluation  Patient identified by MRN, date of birth, ID band Patient awake    Reviewed: Allergy & Precautions, NPO status , Patient's Chart, lab work & pertinent test results  History of Anesthesia Complications Negative for: history of anesthetic complications  Airway Mallampati: II  TM Distance: >3 FB Neck ROM: Full    Dental  (+) Edentulous Upper, Edentulous Lower   Pulmonary sleep apnea and Continuous Positive Airway Pressure Ventilation , COPD, former smoker,    Pulmonary exam normal        Cardiovascular hypertension, + CAD and + Past MI  + dysrhythmias Atrial Fibrillation  Rhythm:Irregular Rate:Normal     Neuro/Psych negative neurological ROS     GI/Hepatic negative GI ROS, (+) Hepatitis -, B  Endo/Other  diabetes  Renal/GU CRFRenal disease (Cr 1.30)  negative genitourinary   Musculoskeletal negative musculoskeletal ROS (+)   Abdominal   Peds  Hematology negative hematology ROS (+)   Anesthesia Other Findings   Reproductive/Obstetrics                            Anesthesia Physical Anesthesia Plan  ASA: 3  Anesthesia Plan: General   Post-op Pain Management: Minimal or no pain anticipated   Induction: Intravenous  PONV Risk Score and Plan: 2 and TIVA and Treatment may vary due to age or medical condition  Airway Management Planned: Mask  Additional Equipment: None  Intra-op Plan:   Post-operative Plan:   Informed Consent: I have reviewed the patients History and Physical, chart, labs and discussed the procedure including the risks, benefits and alternatives for the proposed anesthesia with the patient or authorized representative who has indicated his/her understanding and acceptance.       Plan Discussed with:   Anesthesia Plan Comments:         Anesthesia Quick Evaluation

## 2022-02-14 NOTE — Anesthesia Postprocedure Evaluation (Signed)
Anesthesia Post Note  Patient: Samuel Watkins  Procedure(s) Performed: CARDIOVERSION     Patient location during evaluation: Endoscopy Anesthesia Type: General Level of consciousness: awake and alert Pain management: pain level controlled Vital Signs Assessment: post-procedure vital signs reviewed and stable Respiratory status: spontaneous breathing, nonlabored ventilation and respiratory function stable Cardiovascular status: blood pressure returned to baseline and stable Postop Assessment: no apparent nausea or vomiting Anesthetic complications: no   No notable events documented.  Last Vitals:  Vitals:   02/14/22 1020 02/14/22 1030  BP: 125/87 120/77  Pulse: (!) 54 63  Resp: 18 16  SpO2: 97% 98%    Last Pain:  Vitals:   02/14/22 1030  PainSc: 0-No pain                 Korrie Hofbauer,W. EDMOND

## 2022-02-14 NOTE — Anesthesia Procedure Notes (Signed)
Procedure Name: General with mask airway Date/Time: 02/14/2022 9:51 AM  Performed by: Erick Colace, CRNAPre-anesthesia Checklist: Patient identified, Emergency Drugs available, Suction available and Patient being monitored Patient Re-evaluated:Patient Re-evaluated prior to induction Oxygen Delivery Method: Ambu bag Preoxygenation: Pre-oxygenation with 100% oxygen Induction Type: IV induction Dental Injury: Teeth and Oropharynx as per pre-operative assessment

## 2022-02-14 NOTE — Discharge Instructions (Signed)

## 2022-02-14 NOTE — Transfer of Care (Signed)
Immediate Anesthesia Transfer of Care Note  Patient: Samuel Watkins  Procedure(s) Performed: CARDIOVERSION  Patient Location: Short Stay  Anesthesia Type:General  Level of Consciousness: drowsy  Airway & Oxygen Therapy: Patient Spontanous Breathing  Post-op Assessment: Report given to RN and Post -op Vital signs reviewed and stable  Post vital signs: Reviewed and stable  Last Vitals:  Vitals Value Taken Time  BP 120/91   Temp    Pulse 78 02/14/22 0948  Resp 17 02/14/22 0948  SpO2 93 % 02/14/22 0948  Vitals shown include unvalidated device data.  Last Pain:  Vitals:   02/14/22 0855  PainSc: 0-No pain         Complications: No notable events documented.

## 2022-02-15 ENCOUNTER — Encounter (HOSPITAL_COMMUNITY): Payer: Self-pay | Admitting: Cardiology

## 2022-02-27 NOTE — Progress Notes (Signed)
Cardiology Office Note:    Date:  02/28/2022   ID:  Samuel Watkins, DOB Dec 30, 1945, MRN [id number]  PCP:  Lawerance Cruel, MD  Cardiologist:  Sinclair Grooms, MD   Referring MD: Lawerance Cruel, MD   Chief Complaint  Patient presents with   Coronary Artery Disease   Atrial Fibrillation   Congestive Heart Failure   Hypertension    History of Present Illness:    Samuel Watkins is a 76 y.o. male with a hx of of CAD (BMS to RCA and DES to RCA 2001 and 2008, LAD DES 2008), PAD, DM2, COPD, hypertension, hyperlipidemia, AAA s/p repair, and atrial fibrillation with electrical cardioversion 02/12/2022 .   He is status post cardioversion..  States he does not feel any different now than he did prior to the cardioversion.  Still has dyspnea on exertion but smokes cigarettes and feels that it could be related to COPD.  He denies angina, orthopnea, PND, lower extremity swelling, and syncope.  He needs help getting his medication authorized through the [facility] with Dr. Kandee Keen.  Past Medical History:  Diagnosis Date   CAD (coronary artery disease)    Chronic hepatitis B (HCC)    CKD (chronic kidney disease)    COPD (chronic obstructive pulmonary disease) (HCC)    Diabetes mellitus without complication (HCC)    Hyperlipemia    Hypertension    Myocardial infarction (HCC)    OSA on CPAP    Peripheral neuropathy     Past Surgical History:  Procedure Laterality Date   ABDOMINAL AORTIC ANEURYSM REPAIR     CARDIOVERSION N/A 02/14/2022   Procedure: CARDIOVERSION;  Surgeon: Lelon Perla, MD;  Location: [facility];  Service: Cardiovascular;  Laterality: N/A;   LUMBAR [place]     found spot on lung 10 years ago    Current Medications: Current Meds  Medication Sig   albuterol (PROVENTIL HFA;VENTOLIN HFA) 108 (90 BASE) MCG/ACT inhaler Inhale 2 puffs into the lungs 4 (four) times daily as needed for wheezing or shortness of breath.    amLODipine (NORVASC) 10 MG tablet Take 1 tablet (10 mg total) by mouth daily.   atorvastatin (LIPITOR) 80 MG tablet Take 80 mg by mouth at bedtime.   Cholecalciferol (VITAMIN D3) 5000 units CAPS Take 5,000 Units by mouth daily.   empagliflozin (JARDIANCE) 10 MG TABS tablet Take 1 tablet (10 mg total) by mouth daily.   empagliflozin (JARDIANCE) 25 MG TABS tablet Take 12.5 mg by mouth daily.   gabapentin (NEURONTIN) 300 MG capsule Take 300 mg by mouth at bedtime.   glimepiride (AMARYL) 2 MG tablet Take 2 mg by mouth daily with breakfast.   glucose blood (ACCU-CHEK SMARTVIEW) test strip See admin instructions.   losartan-hydrochlorothiazide (HYZAAR) 50-12.5 MG tablet Take 1 tablet by mouth daily.   Magnesium Oxide 420 MG TABS Take 2 tablets by mouth See admin instructions. Take 2 tablets daily except skip dose on Sundays   metFORMIN (GLUCOPHAGE-XR) 500 MG 24 hr tablet Take 1,000 mg by mouth 2 (two) times daily.   niacin 500 MG tablet Take 1,000 mg by mouth at bedtime.   nitroGLYCERIN (NITROSTAT) 0.4 MG SL tablet Place 0.4 mg under the tongue every 5 (five) minutes x 3 doses as needed for chest pain (Call 911 at 3rd dose within 15 minutes.).   pantoprazole (PROTONIX) 40 MG tablet Take 40 mg by mouth daily.   pioglitazone (ACTOS) 30  MG tablet Take 15 mg by mouth daily.   Tiotropium Bromide-Olodaterol 2.5-2.5 MCG/ACT AERS Inhale 2 puffs into the lungs daily.   [DISCONTINUED] apixaban (ELIQUIS) 5 MG TABS tablet Take 1 tablet (5 mg total) by mouth 2 (two) times daily.   [DISCONTINUED] metoprolol succinate (TOPROL XL) 25 MG 24 hr tablet Take 1 tablet (25 mg total) by mouth daily.   [DISCONTINUED] metoprolol succinate (TOPROL XL) 25 MG 24 hr tablet Take 0.5 tablets (12.5 mg total) by mouth daily.     Allergies:   Patient has no known allergies.   Social History   Socioeconomic History   Marital status: Married    Spouse name: [name]   Number of children: 3   Years of education: 12   Highest  education level: Not on file  Occupational History   Occupation: retired  Tobacco Use   Smoking status: Former    Packs/day: 1.00    Years: 50.00    Total pack years: 50.00    Types: Cigarettes    Quit date: 07/14/2010    Years since quitting: 11.6   Smokeless tobacco: Never   Tobacco comments:    Counseled to remain smoke free.  Vaping Use   Vaping Use: Never used  Substance and Sexual Activity   Alcohol use: No    Alcohol/week: 0.0 standard drinks of alcohol   Drug use: No   Sexual activity: Not on file  Other Topics Concern   Not on file  Social History Narrative   Lives with wife   Caffeine- coffee 2 c, soda 1, tea 1   Social Determinants of Health   Financial Resource Strain: Not on file  Food Insecurity: Not on file  Transportation Needs: Not on file  Physical Activity: Not on file  Stress: Not on file  Social Connections: Not on file     Family History: The patient's family history includes Heart disease in his father and mother; Throat cancer in his brother.  ROS:   Please see the history of present illness.    Using CPAP.  All other systems reviewed and are negative.  EKGs/Labs/Other Studies Reviewed:    The following studies were reviewed today:  2 D DOPPLER ECHOCARDIOGRAM 09/23/2021 IMPRESSIONS   1. Left ventricular ejection fraction, by estimation, is 60 to 65%. Left  ventricular ejection fraction by 3D volume is 61 %. The left ventricle has  normal function. The left ventricle has no regional wall motion  abnormalities. Left ventricular diastolic   parameters are consistent with Grade I diastolic dysfunction (impaired  relaxation).   2. Right ventricular systolic function is normal. The right ventricular  size is normal.   3. Left atrial size was mildly dilated.   4. The mitral valve is grossly normal. Trivial mitral valve  regurgitation.   5. The aortic valve is tricuspid. Aortic valve regurgitation is not  visualized.   6. Aortic dilatation  noted. There is mild dilatation of the ascending  aorta, measuring 40 mm.   7. The inferior vena cava is normal in size with greater than 50%  respiratory variability, suggesting right atrial pressure of 3 mmHg.   Comparison(s): No prior Echocardiogram.   EKG: Sinus bradycardia, 50 bpm, right bundle with QRSD 160 ms, and nonspecific ST abnormality.  Right axis deviation.  When compared to tracing from 01/16/2022, atrial fibrillation is  no longer present.  Recent Labs: 12/24/2021: ALT 23; Magnesium 2.0; TSH 1.900 01/15/2022: Platelets 147 02/14/2022: BUN 26; Creatinine, Ser 1.30; Hemoglobin 16.0; Potassium  4.7; Sodium 140  Recent Lipid Panel    Component Value Date/Time   CHOL 93 (L) 06/17/2019 1024   TRIG 79 06/17/2019 1024   HDL 30 (L) 06/17/2019 1024   CHOLHDL 3.1 06/17/2019 1024   CHOLHDL 10.5 07/10/2007 0345   VLDL 21 07/10/2007 0345   LDLCALC 47 06/17/2019 1024    Physical Exam:    VS:  BP 130/64   Pulse (!) 50   Ht '6\' 1"'$  (1.854 m)   Wt 229 lb 3.2 oz (104 kg)   SpO2 94%   BMI 30.24 kg/m     Wt Readings from Last 3 Encounters:  02/28/22 229 lb 3.2 oz (104 kg)  02/14/22 226 lb (102.5 kg)  02/03/22 226 lb 9.6 oz (102.8 kg)     GEN: Overweight. No acute distress HEENT: Normal NECK: No JVD. LYMPHATICS: No lymphadenopathy CARDIAC: No murmur.  Sinus bradycardia.  Otherwise RRR no gallop, or edema. VASCULAR:  Normal Pulses. No bruits. RESPIRATORY:  Clear to auscultation without rales, wheezing or rhonchi  ABDOMEN: Soft, non-tender, non-distended, No pulsatile mass, MUSCULOSKELETAL: No deformity  SKIN: Warm and dry NEUROLOGIC:  Alert and oriented x 3 PSYCHIATRIC:  Normal affect   ASSESSMENT:    1. PAF (paroxysmal atrial fibrillation) ([place])   2. Hypercoagulable state ([place])   3. Coronary artery disease of native artery of native heart with stable angina pectoris ([place])   4. OSA (obstructive sleep apnea)   5. PAD (peripheral artery disease) ([place])   6. Hyperlipidemia LDL  goal <70   7. Essential hypertension   8. Controlled type 2 diabetes mellitus with other circulatory complication, without long-term current use of insulin (HCC)   9. Paroxysmal atrial fibrillation (HCC)    PLAN:    In order of problems listed above:  Maintaining sinus rhythm now greater than 1 week post cardioversion.  Has significant bradycardia, and therefore we will decrease the metoprolol to 12.5 mg/day.  PR interval is 210 ms and he does have right bundle branch block with QRST greater than 160, this prone to develop high-grade AV block. We faxed prescription for metoprolol and Eliquis to Dr. Herby Abraham at the [facility]. Secondary prevention reviewed CPAP device is malfunctioning.  He has an upcoming appointment. No complaints Continue statin therapy Excellent blood pressure control Continue Jardiance.   The natural history of atrial fibrillation was discussed.  The inability to cure and the possibility of recurrences was clearly stated.  Management strategies including rhythm control with antiarrhythmic therapy and/or ablation,  rate control (beta-blocker therapy, digoxin, or AV node blocking CCB therapy), and  occasional need for pacemaker therapy were reviewed.  Stroke risk (as determined by CHADS VASC score >1). The requirement for and the duration/permanence of anti-coagulation therapy will be determined by context of individual risk factors and burden of AF.  Anticoagulation risks (vitamin K antagonists v DOAC therapy) were reviewed, highlighting the lower bleeding ris'@10RELATIVEDAYS'$ @ k and improved safety with DOAC's. Shared decision making was stressed.    Medication Adjustments/Labs and Tests Ordered: Current medicines are reviewed at length with the patient today.  Concerns regarding medicines are outlined above.  Orders Placed This Encounter  Procedures   EKG 12-Lead   Meds ordered this encounter  Medications   DISCONTD: metoprolol succinate (TOPROL XL) 25 MG 24 hr  tablet    Sig: Take 0.5 tablets (12.5 mg total) by mouth daily.    Dispense:  45 tablet    Refill:  3    Dose change  metoprolol succinate (TOPROL XL) 25 MG 24 hr tablet    Sig: Take 0.5 tablets (12.5 mg total) by mouth daily.    Dispense:  45 tablet    Refill:  3    Dose change   apixaban (ELIQUIS) 5 MG TABS tablet    Sig: Take 1 tablet (5 mg total) by mouth 2 (two) times daily.    Dispense:  180 tablet    Refill:  3    Patient Instructions  Medication Instructions:  Your physician has recommended you make the following change in your medication:   1) DECREASE Metoprolol succinate (Toprol XL) to 12.'5mg'$  daily  *If you need a refill on your cardiac medications before your next appointment, please call your pharmacy*  Lab Work: NONE  Testing/Procedures: NONE  Follow-Up: At [facility], you and your health needs are our priority.  As part of our continuing mission to provide you with exceptional heart care, we have created designated Provider Care Teams.  These Care Teams include your primary Cardiologist (physician) and Advanced Practice Providers (APPs -  Physician Assistants and Nurse Practitioners) who all work together to provide you with the care you need, when you need it.  Your next appointment:   3 month(s)  The format for your next appointment:   In Person  Provider:   Sinclair Grooms, MD {  Important Information About Sugar         Signed, Sinclair Grooms, MD  02/28/2022 8:55 AM    [facility]

## 2022-02-28 ENCOUNTER — Encounter: Payer: Self-pay | Admitting: Interventional Cardiology

## 2022-02-28 ENCOUNTER — Ambulatory Visit: Payer: Medicare HMO | Admitting: Interventional Cardiology

## 2022-02-28 VITALS — BP 130/64 | HR 50 | Ht 73.0 in | Wt 229.2 lb

## 2022-02-28 DIAGNOSIS — E1159 Type 2 diabetes mellitus with other circulatory complications: Secondary | ICD-10-CM | POA: Diagnosis not present

## 2022-02-28 DIAGNOSIS — I10 Essential (primary) hypertension: Secondary | ICD-10-CM | POA: Diagnosis not present

## 2022-02-28 DIAGNOSIS — E785 Hyperlipidemia, unspecified: Secondary | ICD-10-CM

## 2022-02-28 DIAGNOSIS — G4733 Obstructive sleep apnea (adult) (pediatric): Secondary | ICD-10-CM

## 2022-02-28 DIAGNOSIS — I25118 Atherosclerotic heart disease of native coronary artery with other forms of angina pectoris: Secondary | ICD-10-CM | POA: Diagnosis not present

## 2022-02-28 DIAGNOSIS — I480 Paroxysmal atrial fibrillation: Secondary | ICD-10-CM | POA: Diagnosis not present

## 2022-02-28 DIAGNOSIS — I739 Peripheral vascular disease, unspecified: Secondary | ICD-10-CM

## 2022-02-28 DIAGNOSIS — D6859 Other primary thrombophilia: Secondary | ICD-10-CM | POA: Diagnosis not present

## 2022-02-28 MED ORDER — METOPROLOL SUCCINATE ER 25 MG PO TB24: 12.5000 mg | ORAL_TABLET | Freq: Every day | ORAL | 3 refills | Status: DC

## 2022-02-28 MED ORDER — APIXABAN 5 MG PO TABS: 5.0000 mg | ORAL_TABLET | Freq: Two times a day (BID) | ORAL | 3 refills | Status: AC

## 2022-02-28 NOTE — Patient Instructions (Addendum)
Medication Instructions:  Your physician has recommended you make the following change in your medication:   1) DECREASE Metoprolol succinate (Toprol XL) to 12.'5mg'$  daily  *If you need a refill on your cardiac medications before your next appointment, please call your pharmacy*  Lab Work: NONE  Testing/Procedures: NONE  Follow-Up: At [facility], you and your health needs are our priority.  As part of our continuing mission to provide you with exceptional heart care, we have created designated Provider Care Teams.  These Care Teams include your primary Cardiologist (physician) and Advanced Practice Providers (APPs -  Physician Assistants and Nurse Practitioners) who all work together to provide you with the care you need, when you need it.  Your next appointment:   3 month(s)  The format for your next appointment:   In Person  Provider:   Sinclair Grooms, MD {  Important Information About Sugar

## 2022-03-19 DIAGNOSIS — Z961 Presence of intraocular lens: Secondary | ICD-10-CM | POA: Diagnosis not present

## 2022-03-19 DIAGNOSIS — H52223 Regular astigmatism, bilateral: Secondary | ICD-10-CM | POA: Diagnosis not present

## 2022-03-19 DIAGNOSIS — E119 Type 2 diabetes mellitus without complications: Secondary | ICD-10-CM | POA: Diagnosis not present

## 2022-03-19 DIAGNOSIS — Z135 Encounter for screening for eye and ear disorders: Secondary | ICD-10-CM | POA: Diagnosis not present

## 2022-03-19 DIAGNOSIS — H524 Presbyopia: Secondary | ICD-10-CM | POA: Diagnosis not present

## 2022-03-21 ENCOUNTER — Telehealth: Payer: Self-pay | Admitting: Interventional Cardiology

## 2022-03-21 NOTE — Telephone Encounter (Signed)
Returned call to patient, he states he missed a call from our office.  No documentation of any recent telephone call attempts in the chart. No procedures waiting to be scheduled seen.  Patient said if someone was trying to call him they will try again.  Patient expressed appreciation for follow-up.

## 2022-03-21 NOTE — Telephone Encounter (Signed)
Patient is calling stating he is returning a call. Please advise.

## 2022-04-04 DIAGNOSIS — B372 Candidiasis of skin and nail: Secondary | ICD-10-CM | POA: Diagnosis not present

## 2022-04-14 DIAGNOSIS — E669 Obesity, unspecified: Secondary | ICD-10-CM | POA: Diagnosis not present

## 2022-04-14 DIAGNOSIS — L2089 Other atopic dermatitis: Secondary | ICD-10-CM | POA: Diagnosis not present

## 2022-05-02 DIAGNOSIS — I700 Atherosclerosis of aorta: Secondary | ICD-10-CM | POA: Diagnosis not present

## 2022-05-02 DIAGNOSIS — I739 Peripheral vascular disease, unspecified: Secondary | ICD-10-CM | POA: Diagnosis not present

## 2022-05-02 DIAGNOSIS — J449 Chronic obstructive pulmonary disease, unspecified: Secondary | ICD-10-CM | POA: Diagnosis not present

## 2022-05-02 DIAGNOSIS — E1140 Type 2 diabetes mellitus with diabetic neuropathy, unspecified: Secondary | ICD-10-CM | POA: Diagnosis not present

## 2022-05-02 DIAGNOSIS — Z0000 Encounter for general adult medical examination without abnormal findings: Secondary | ICD-10-CM | POA: Diagnosis not present

## 2022-05-02 DIAGNOSIS — N183 Chronic kidney disease, stage 3 unspecified: Secondary | ICD-10-CM | POA: Diagnosis not present

## 2022-05-02 DIAGNOSIS — Z6830 Body mass index (BMI) 30.0-30.9, adult: Secondary | ICD-10-CM | POA: Diagnosis not present

## 2022-05-02 DIAGNOSIS — K219 Gastro-esophageal reflux disease without esophagitis: Secondary | ICD-10-CM | POA: Diagnosis not present

## 2022-05-15 DIAGNOSIS — N183 Chronic kidney disease, stage 3 unspecified: Secondary | ICD-10-CM | POA: Diagnosis not present

## 2022-05-15 DIAGNOSIS — E559 Vitamin D deficiency, unspecified: Secondary | ICD-10-CM | POA: Diagnosis not present

## 2022-05-15 DIAGNOSIS — E782 Mixed hyperlipidemia: Secondary | ICD-10-CM | POA: Diagnosis not present

## 2022-05-15 DIAGNOSIS — E1140 Type 2 diabetes mellitus with diabetic neuropathy, unspecified: Secondary | ICD-10-CM | POA: Diagnosis not present

## 2022-05-15 DIAGNOSIS — I739 Peripheral vascular disease, unspecified: Secondary | ICD-10-CM | POA: Diagnosis not present

## 2022-05-15 DIAGNOSIS — J449 Chronic obstructive pulmonary disease, unspecified: Secondary | ICD-10-CM | POA: Diagnosis not present

## 2022-05-15 DIAGNOSIS — I10 Essential (primary) hypertension: Secondary | ICD-10-CM | POA: Diagnosis not present

## 2022-05-15 DIAGNOSIS — E1165 Type 2 diabetes mellitus with hyperglycemia: Secondary | ICD-10-CM | POA: Diagnosis not present

## 2022-05-15 DIAGNOSIS — I700 Atherosclerosis of aorta: Secondary | ICD-10-CM | POA: Diagnosis not present

## 2022-05-24 DIAGNOSIS — R051 Acute cough: Secondary | ICD-10-CM | POA: Diagnosis not present

## 2022-05-24 DIAGNOSIS — Z03818 Encounter for observation for suspected exposure to other biological agents ruled out: Secondary | ICD-10-CM | POA: Diagnosis not present

## 2022-06-01 NOTE — Progress Notes (Unsigned)
Cardiology Office Note:    Date:  06/03/2022   ID:  CALTON HARSHFIELD, DOB [birth year], MRN [id number]  PCP:  Lawerance Cruel, MD  Cardiologist:  Sinclair Grooms, MD   Referring MD: Lawerance Cruel, MD   Chief Complaint  Patient presents with   Atrial Fibrillation   Coronary Artery Disease   Congestive Heart Failure    History of Present Illness:    Samuel Watkins is a 76 y.o. male with a hx of CAD (BMS to RCA and DES to RCA 2001 and 2008, LAD DES 2008), PAD, DM2, COPD, hypertension, hyperlipidemia, AAA s/p repair, and atrial fibrillation with electrical cardioversion 02/12/2022 .     Says he feels great.  No change in endurance and breathing since last being seen in August 2023 after cardioversion.  No palpitations or irregular heart beating.  Denies orthopnea.  No lower extremity swelling.  No episodes of syncope.  He denies angina.  He has been no medication side effects.  Past Medical History:  Diagnosis Date   CAD (coronary artery disease)    Chronic hepatitis B (HCC)    CKD (chronic kidney disease)    COPD (chronic obstructive pulmonary disease) (HCC)    Diabetes mellitus without complication (HCC)    Hyperlipemia    Hypertension    Myocardial infarction (HCC)    OSA on CPAP    Peripheral neuropathy     Past Surgical History:  Procedure Laterality Date   ABDOMINAL AORTIC ANEURYSM REPAIR     CARDIOVERSION N/A 02/14/2022   Procedure: CARDIOVERSION;  Surgeon: Lelon Perla, MD;  Location: [facility];  Service: Cardiovascular;  Laterality: N/A;   LUMBAR [place]     found spot on lung 10 years ago    Current Medications: Current Meds  Medication Sig   albuterol (PROVENTIL HFA;VENTOLIN HFA) 108 (90 BASE) MCG/ACT inhaler Inhale 2 puffs into the lungs 4 (four) times daily as needed for wheezing or shortness of breath.   amLODipine (NORVASC) 10 MG tablet Take 1 tablet (10 mg total) by mouth daily.   apixaban (ELIQUIS) 5 MG TABS  tablet Take 1 tablet (5 mg total) by mouth 2 (two) times daily.   atorvastatin (LIPITOR) 80 MG tablet Take 80 mg by mouth at bedtime.   Cholecalciferol (VITAMIN D3) 5000 units CAPS Take 5,000 Units by mouth daily.   empagliflozin (JARDIANCE) 25 MG TABS tablet Take 12.5 mg by mouth daily.   gabapentin (NEURONTIN) 300 MG capsule Take 300 mg by mouth at bedtime.   glimepiride (AMARYL) 2 MG tablet Take 2 mg by mouth daily with breakfast.   glucose blood (ACCU-CHEK SMARTVIEW) test strip See admin instructions.   losartan-hydrochlorothiazide (HYZAAR) 50-12.5 MG tablet Take 1 tablet by mouth daily.   Magnesium Oxide 420 MG TABS Take 2 tablets by mouth See admin instructions. Take 2 tablets daily except skip dose on Sundays   metFORMIN (GLUCOPHAGE-XR) 500 MG 24 hr tablet Take 1,000 mg by mouth 2 (two) times daily.   metoprolol succinate (TOPROL XL) 25 MG 24 hr tablet Take 1 tablet (25 mg total) by mouth daily.   niacin 500 MG tablet Take 1,000 mg by mouth at bedtime.   nitroGLYCERIN (NITROSTAT) 0.4 MG SL tablet Place 0.4 mg under the tongue every 5 (five) minutes x 3 doses as needed for chest pain (Call 911 at 3rd dose within 15 minutes.).   pantoprazole (PROTONIX) 40 MG tablet Take 40 mg by mouth  daily.   pioglitazone (ACTOS) 30 MG tablet Take 15 mg by mouth daily.   Tiotropium Bromide-Olodaterol 2.5-2.5 MCG/ACT AERS Inhale 2 puffs into the lungs daily.   [DISCONTINUED] metoprolol succinate (TOPROL XL) 25 MG 24 hr tablet Take 0.5 tablets (12.5 mg total) by mouth daily.     Allergies:   Patient has no known allergies.   Social History   Socioeconomic History   Marital status: Married    Spouse name: [name]   Number of children: 3   Years of education: 12   Highest education level: Not on file  Occupational History   Occupation: retired  Tobacco Use   Smoking status: Former    Packs/day: 1.00    Years: 50.00    Total pack years: 50.00    Types: Cigarettes    Quit date: 07/14/2010    Years  since quitting: 11.8   Smokeless tobacco: Never   Tobacco comments:    Counseled to remain smoke free.  Vaping Use   Vaping Use: Never used  Substance and Sexual Activity   Alcohol use: No    Alcohol/week: 0.0 standard drinks of alcohol   Drug use: No   Sexual activity: Not on file  Other Topics Concern   Not on file  Social History Narrative   Lives with wife   Caffeine- coffee 2 c, soda 1, tea 1   Social Determinants of Health   Financial Resource Strain: Not on file  Food Insecurity: Not on file  Transportation Needs: Not on file  Physical Activity: Not on file  Stress: Not on file  Social Connections: Not on file     Family History: The patient's family history includes Heart disease in his father and mother; Throat cancer in his brother.  ROS:   Please see the history of present illness.    Feels sleepy all the time has not taken care of resuming CPAP all other systems reviewed and are negative.  EKGs/Labs/Other Studies Reviewed:    The following studies were reviewed today:  2D Doppler echocardiogram March 2023: IMPRESSIONS   1. Left ventricular ejection fraction, by estimation, is 60 to 65%. Left  ventricular ejection fraction by 3D volume is 61 %. The left ventricle has  normal function. The left ventricle has no regional wall motion  abnormalities. Left ventricular diastolic   parameters are consistent with Grade I diastolic dysfunction (impaired  relaxation).   2. Right ventricular systolic function is normal. The right ventricular  size is normal.   3. Left atrial size was mildly dilated.   4. The mitral valve is grossly normal. Trivial mitral valve  regurgitation.   5. The aortic valve is tricuspid. Aortic valve regurgitation is not  visualized.   6. Aortic dilatation noted. There is mild dilatation of the ascending  aorta, measuring 40 mm.   7. The inferior vena cava is normal in size with greater than 50%  respiratory variability, suggesting right  atrial pressure of 3 mmHg.    EKG:  EKG atrial fibrillation with increased ventricular response at 94 bpm, right bundle, left posterior hemiblock, and when compared to the prior tracing performed February 28, 2022, atrial fibrillation is recurrent.  Conduction abnormality is unchanged.  Recent Labs: 12/24/2021: ALT 23; Magnesium 2.0; TSH 1.900 01/15/2022: Platelets 147 02/14/2022: BUN 26; Creatinine, Ser 1.30; Hemoglobin 16.0; Potassium 4.7; Sodium 140  Recent Lipid Panel    Component Value Date/Time   CHOL 93 (L) 06/17/2019 1024   TRIG 79 06/17/2019 1024  HDL 30 (L) 06/17/2019 1024   CHOLHDL 3.1 06/17/2019 1024   CHOLHDL 10.5 07/10/2007 0345   VLDL 21 07/10/2007 0345   LDLCALC 47 06/17/2019 1024    Physical Exam:    VS:  BP (!) 106/56   Pulse 76   Ht '6\' 1"'$  (1.854 m)   Wt 228 lb (103.4 kg)   SpO2 97%   BMI 30.08 kg/m     Wt Readings from Last 3 Encounters:  06/03/22 228 lb (103.4 kg)  02/28/22 229 lb 3.2 oz (104 kg)  02/14/22 226 lb (102.5 kg)     GEN: Overweight. No acute distress HEENT: Normal NECK: No JVD. LYMPHATICS: No lymphadenopathy CARDIAC: No murmur. IIRR no gallop, or edema. VASCULAR:  Normal Pulses. No bruits. RESPIRATORY:  Clear to auscultation without rales, wheezing or rhonchi  ABDOMEN: Soft, non-tender, non-distended, No pulsatile mass, MUSCULOSKELETAL: No deformity  SKIN: Warm and dry NEUROLOGIC:  Alert and oriented x 3 PSYCHIATRIC:  Normal affect   ASSESSMENT:    1. PAF (paroxysmal atrial fibrillation) ([place])   2. Hypercoagulable state ([place])   3. Coronary artery disease of native artery of native heart with stable angina pectoris ([place])   4. OSA (obstructive sleep apnea)   5. PAD (peripheral artery disease) ([place])   6. Hyperlipidemia LDL goal <70   7. Essential hypertension   8. Trifascicular block    PLAN:    In order of problems listed above:  ER AF post cardioversion.  Remained in sinus rhythm for at least 2 weeks post cardioversion.   Discussion concerning further management choices.  Recommended that he see EP to determine if ablation is a reasonable consideration.  In reviewing the echocardiogram done in March, left atrial size was not significantly increased.  Increase metoprolol succinate to 25 mg/day. Continue Eliquis. Secondary prevention.  Niacin can be discontinued.  If triglyceride becomes an issue, would recommend Vascepa. Encouraged CPAP Secondary prevention.  He aerobic activity with walking. Okay to discontinue niacin.  Continue max dose Lipitor. Blood pressure is relatively low. Right bundle, left posterior hemiblock, and first-degree AV block were present when in sinus rhythm on February 28, 2022.  The natural history of atrial fibrillation was discussed.  The inability to cure and the possibility of recurrences was clearly stated.  Management strategies including rhythm control with antiarrhythmic therapy and/or ablation,  rate control (beta-blocker therapy, digoxin, or AV node blocking CCB therapy), and  occasional need for pacemaker therapy were reviewed.  Stroke risk (as determined by CHADS VASC score >1). The requirement for and the duration/permanence of anti-coagulation therapy will be determined by context of individual risk factors and burden of AF.  Anticoagulation risks (vitamin K antagonists v DOAC therapy) were reviewed, highlighting the lower bleeding risk and improved safety with DOAC's. Shared decision making was stressed.  Given recent data demonstrating benefit for maintenance of sinus rhythm, A-fib ablation consideration was encouraged given the worsened long-term outcome in those with rate control.   Medication Adjustments/Labs and Tests Ordered: Current medicines are reviewed at length with the patient today.  Concerns regarding medicines are outlined above.  Orders Placed This Encounter  Procedures   Ambulatory referral to Cardiac Electrophysiology   EKG 12-Lead   Meds ordered this encounter   Medications   metoprolol succinate (TOPROL XL) 25 MG 24 hr tablet    Sig: Take 1 tablet (25 mg total) by mouth daily.    Dispense:  90 tablet    Refill:  3    Dose change (increased)  Patient Instructions  Medication Instructions:  Your physician has recommended you make the following change in your medication:   1) INCREASE metoprolol succinate (Toprol XL) to '25mg'$  daily  *If you need a refill on your cardiac medications before your next appointment, please call your pharmacy*  Follow-Up: At [facility], you and your health needs are our priority.  As part of our continuing mission to provide you with exceptional heart care, we have created designated Provider Care Teams.  These Care Teams include your primary Cardiologist (physician) and Advanced Practice Providers (APPs -  Physician Assistants and Nurse Practitioners) who all work together to provide you with the care you need, when you need it.  Your next appointment:   6 month(s)  The format for your next appointment:   In Person  Provider:   New Cardiologist (you may request one of our providers or have one assigned to you)  Other Instructions You have been referred to our electrophysiology department. The scheduler will call you to arrange an appointment to see Dr. Kaleen Odea to discuss/consider possible atrial fibrillation ablation.  Important Information About Sugar         Signed, Sinclair Grooms, MD  06/03/2022 9:11 AM    [place]

## 2022-06-03 ENCOUNTER — Encounter: Payer: Self-pay | Admitting: Interventional Cardiology

## 2022-06-03 ENCOUNTER — Ambulatory Visit: Payer: Medicare HMO | Attending: Interventional Cardiology | Admitting: Interventional Cardiology

## 2022-06-03 VITALS — BP 106/56 | HR 76 | Ht 73.0 in | Wt 228.0 lb

## 2022-06-03 DIAGNOSIS — E785 Hyperlipidemia, unspecified: Secondary | ICD-10-CM

## 2022-06-03 DIAGNOSIS — I739 Peripheral vascular disease, unspecified: Secondary | ICD-10-CM | POA: Diagnosis not present

## 2022-06-03 DIAGNOSIS — I453 Trifascicular block: Secondary | ICD-10-CM

## 2022-06-03 DIAGNOSIS — D6859 Other primary thrombophilia: Secondary | ICD-10-CM

## 2022-06-03 DIAGNOSIS — G4733 Obstructive sleep apnea (adult) (pediatric): Secondary | ICD-10-CM

## 2022-06-03 DIAGNOSIS — I10 Essential (primary) hypertension: Secondary | ICD-10-CM

## 2022-06-03 DIAGNOSIS — I25118 Atherosclerotic heart disease of native coronary artery with other forms of angina pectoris: Secondary | ICD-10-CM | POA: Diagnosis not present

## 2022-06-03 DIAGNOSIS — I480 Paroxysmal atrial fibrillation: Secondary | ICD-10-CM

## 2022-06-03 MED ORDER — METOPROLOL SUCCINATE ER 25 MG PO TB24: 25.0000 mg | ORAL_TABLET | Freq: Every day | ORAL | 3 refills | Status: DC

## 2022-06-03 NOTE — Patient Instructions (Signed)
Medication Instructions:  Your physician has recommended you make the following change in your medication:   1) INCREASE metoprolol succinate (Toprol XL) to '25mg'$  daily  *If you need a refill on your cardiac medications before your next appointment, please call your pharmacy*  Follow-Up: At [facility], you and your health needs are our priority.  As part of our continuing mission to provide you with exceptional heart care, we have created designated Provider Care Teams.  These Care Teams include your primary Cardiologist (physician) and Advanced Practice Providers (APPs -  Physician Assistants and Nurse Practitioners) who all work together to provide you with the care you need, when you need it.  Your next appointment:   6 month(s)  The format for your next appointment:   In Person  Provider:   New Cardiologist (you may request one of our providers or have one assigned to you)  Other Instructions You have been referred to our electrophysiology department. The scheduler will call you to arrange an appointment to see Dr. Kaleen Odea to discuss/consider possible atrial fibrillation ablation.  Important Information About Sugar

## 2022-06-24 ENCOUNTER — Ambulatory Visit (HOSPITAL_BASED_OUTPATIENT_CLINIC_OR_DEPARTMENT_OTHER)
Admission: RE | Admit: 2022-06-24 | Discharge: 2022-06-24 | Disposition: A | Discharge status: Home / Self Care | Payer: Medicare HMO | Source: Ambulatory Visit | Attending: Acute Care | Admitting: Acute Care

## 2022-06-24 DIAGNOSIS — Z122 Encounter for screening for malignant neoplasm of respiratory organs: Secondary | ICD-10-CM | POA: Insufficient documentation

## 2022-06-24 DIAGNOSIS — J439 Emphysema, unspecified: Secondary | ICD-10-CM | POA: Insufficient documentation

## 2022-06-24 DIAGNOSIS — Z87891 Personal history of nicotine dependence: Secondary | ICD-10-CM | POA: Diagnosis not present

## 2022-06-24 DIAGNOSIS — I700 Atherosclerosis of aorta: Secondary | ICD-10-CM | POA: Diagnosis not present

## 2022-06-26 ENCOUNTER — Other Ambulatory Visit: Payer: Self-pay

## 2022-06-26 DIAGNOSIS — Z87891 Personal history of nicotine dependence: Secondary | ICD-10-CM

## 2022-06-26 DIAGNOSIS — Z122 Encounter for screening for malignant neoplasm of respiratory organs: Secondary | ICD-10-CM

## 2022-07-21 ENCOUNTER — Encounter: Payer: Self-pay | Admitting: Cardiovascular Disease

## 2022-07-21 ENCOUNTER — Ambulatory Visit: Payer: Medicare HMO | Attending: Cardiovascular Disease | Admitting: Cardiovascular Disease

## 2022-07-21 ENCOUNTER — Telehealth: Payer: Self-pay | Admitting: Cardiology

## 2022-07-21 VITALS — BP 118/72 | HR 86 | Ht 73.0 in | Wt 226.0 lb

## 2022-07-21 DIAGNOSIS — I4819 Other persistent atrial fibrillation: Secondary | ICD-10-CM

## 2022-07-21 MED ORDER — METOPROLOL SUCCINATE ER 25 MG PO TB24: 25.0000 mg | ORAL_TABLET | Freq: Every day | ORAL | 3 refills | Status: DC

## 2022-07-21 NOTE — Progress Notes (Signed)
Electrophysiology Office Note:    Date:  07/21/2022   ID:  Samuel Watkins, DOB 26-Dec-1945, MRN [id number]  PCP:  Lawerance Cruel, [place] Providers Cardiologist:  Sinclair Grooms, MD Electrophysiologist:  Melida Quitter, MD     Referring MD: Belva Crome, MD   History of Present Illness:    Samuel Watkins is a 77 y.o. male with a hx listed below, significant for CAD, multiple DES, PAD, DM 2, hypertension, hyperlipidemia, AAA s/p repair, atrial fibrillation, referred for arrhythmia management.  He underwent DC cardioversion in August 2023 and had early recurrence of AF.  He reports that he is very symptomatic with atrial fibrillation.  He is easily fatigued and winded.  He not enjoy doing the things he joined doing in the past due to to fatigue and shortness of breath.  He does not have chest pain, syncope, presyncope.   Past Medical History:  Diagnosis Date   CAD (coronary artery disease)    Chronic hepatitis B (HCC)    CKD (chronic kidney disease)    COPD (chronic obstructive pulmonary disease) (HCC)    Diabetes mellitus without complication (HCC)    Hyperlipemia    Hypertension    Myocardial infarction (HCC)    OSA on CPAP    Peripheral neuropathy     Past Surgical History:  Procedure Laterality Date   ABDOMINAL AORTIC ANEURYSM REPAIR     CARDIOVERSION N/A 02/14/2022   Procedure: CARDIOVERSION;  Surgeon: Lelon Perla, MD;  Location: [facility];  Service: Cardiovascular;  Laterality: N/A;   LUMBAR [place]     found spot on lung 10 years ago    Current Medications: Current Meds  Medication Sig   albuterol (PROVENTIL HFA;VENTOLIN HFA) 108 (90 BASE) MCG/ACT inhaler Inhale 2 puffs into the lungs 4 (four) times daily as needed for wheezing or shortness of breath.   amLODipine (NORVASC) 10 MG tablet Take 1 tablet (10 mg total) by mouth daily.   apixaban (ELIQUIS) 5 MG TABS tablet Take 1 tablet (5 mg total) by  mouth 2 (two) times daily.   atorvastatin (LIPITOR) 80 MG tablet Take 80 mg by mouth at bedtime.   Cholecalciferol (VITAMIN D3) 5000 units CAPS Take 5,000 Units by mouth daily.   empagliflozin (JARDIANCE) 25 MG TABS tablet Take 12.5 mg by mouth daily.   gabapentin (NEURONTIN) 300 MG capsule Take 300 mg by mouth at bedtime.   glimepiride (AMARYL) 2 MG tablet Take 2 mg by mouth daily with breakfast.   glucose blood (ACCU-CHEK SMARTVIEW) test strip See admin instructions.   losartan-hydrochlorothiazide (HYZAAR) 50-12.5 MG tablet Take 1 tablet by mouth daily.   Magnesium Oxide 420 MG TABS Take 2 tablets by mouth See admin instructions. Take 2 tablets daily except skip dose on Sundays   metFORMIN (GLUCOPHAGE-XR) 500 MG 24 hr tablet Take 1,000 mg by mouth 2 (two) times daily.   niacin 500 MG tablet Take 1,000 mg by mouth at bedtime.   nitroGLYCERIN (NITROSTAT) 0.4 MG SL tablet Place 0.4 mg under the tongue every 5 (five) minutes x 3 doses as needed for chest pain (Call 911 at 3rd dose within 15 minutes.).   pantoprazole (PROTONIX) 40 MG tablet Take 40 mg by mouth daily.   pioglitazone (ACTOS) 30 MG tablet Take 15 mg by mouth daily.   Tiotropium Bromide-Olodaterol 2.5-2.5 MCG/ACT AERS Inhale 2 puffs into the lungs daily.   [DISCONTINUED] metoprolol succinate (  TOPROL XL) 25 MG 24 hr tablet Take 1 tablet (25 mg total) by mouth daily.     Allergies:   Patient has no known allergies.   Social History   Socioeconomic History   Marital status: Married    Spouse name: [name]   Number of children: 3   Years of education: 12   Highest education level: Not on file  Occupational History   Occupation: retired  Tobacco Use   Smoking status: Former    Packs/day: 1.00    Years: 50.00    Total pack years: 50.00    Types: Cigarettes    Quit date: 07/14/2010    Years since quitting: 12.0   Smokeless tobacco: Never   Tobacco comments:    Counseled to remain smoke free.  Vaping Use   Vaping Use: Never  used  Substance and Sexual Activity   Alcohol use: No    Alcohol/week: 0.0 standard drinks of alcohol   Drug use: No   Sexual activity: Not on file  Other Topics Concern   Not on file  Social History Narrative   Lives with wife   Caffeine- coffee 2 c, soda 1, tea 1   Social Determinants of Health   Financial Resource Strain: Not on file  Food Insecurity: Not on file  Transportation Needs: Not on file  Physical Activity: Not on file  Stress: Not on file  Social Connections: Not on file     Family History: The patient's family history includes Heart disease in his father and mother; Throat cancer in his brother.  ROS:   Please see the history of present illness.    All other systems reviewed and are negative.  EKGs/Labs/Other Studies Reviewed Today:     TTE 09/2021: normal EF, LA mildly dilated  EKG:  Last EKG results: today - AF   Recent Labs: 12/24/2021: ALT 23; Magnesium 2.0; TSH 1.900 01/15/2022: Platelets 147 02/14/2022: BUN 26; Creatinine, Ser 1.30; Hemoglobin 16.0; Potassium 4.7; Sodium 140     Physical Exam:    VS:  BP 118/72   Pulse 86   Ht '6\' 1"'$  (1.854 m)   Wt 226 lb (102.5 kg)   SpO2 95%   BMI 29.82 kg/m     Wt Readings from Last 3 Encounters:  07/21/22 226 lb (102.5 kg)  06/03/22 228 lb (103.4 kg)  02/28/22 229 lb 3.2 oz (104 kg)     GEN: Well nourished, well developed in no acute distress CARDIAC: iRRR, no murmurs, rubs, gallops RESPIRATORY:  Normal work of breathing MUSCULOSKELETAL: no edema    ASSESSMENT & PLAN:    Persistent AF: symptomatic. Failed DC cardioversion. We discussed the indication, rationale, logistics, anticipated benefits, and potential risks of the ablation procedure including but not limited to -- bleed at the groin access site, chest pain, damage to nearby organs such as the diaphragm, lungs, or esophagus, need for a drainage tube, or prolonged hospitalization. I explained that the risk for stroke, heart attack, need for  open chest surgery, or even death is very low but not zero. he  expressed understanding and wishes to proceed.  Secondary hypercoagulable state: continue anticoagulation Hepatitis B COPD        Medication Adjustments/Labs and Tests Ordered: Current medicines are reviewed at length with the patient today.  Concerns regarding medicines are outlined above.  Orders Placed This Encounter  Procedures   EKG 12-Lead   No orders of the defined types were placed in this encounter.    Signed,  Melida Quitter, MD  07/21/2022 12:18 PM    [place]

## 2022-07-21 NOTE — Telephone Encounter (Signed)
Pt's medication was sent to pt's pharmacy as requested. Confirmation received.  °

## 2022-07-21 NOTE — Patient Instructions (Addendum)
Medication Instructions:  Your physician recommends that you continue on your current medications as directed. Please refer to the Current Medication list given to you today.  *If you need a refill on your cardiac medications before your next appointment, please call your pharmacy*  Lab Work: On Monday September 22, 2022: CBC and BMET (you will meet with April G. to review instruction letters for cardiac CT and ablation) If you have labs (blood work) drawn today and your tests are completely normal, you will receive your results only by: [place] (if you have MyChart) OR A paper copy in the mail If you have any lab test that is abnormal or we need to change your treatment, we will call you to review the results.  Testing/Procedures: Your physician has recommended that you have an ablation on Monday October 06, 2022 at 9:30AM. Please arrive at 7:30AM. Catheter ablation is a medical procedure used to treat some cardiac arrhythmias (irregular heartbeats). During catheter ablation, a long, thin, flexible tube is put into a blood vessel in your groin (upper thigh), or neck. This tube is called an ablation catheter. It is then guided to your heart through the blood vessel. Radio frequency waves destroy small areas of heart tissue where abnormal heartbeats may cause an arrhythmia to start. Please see the instruction sheet given to you today.   Follow-Up: We will contact you to schedule your follow-up.   Important Information About Sugar

## 2022-07-21 NOTE — Addendum Note (Signed)
Addended by: Molli Barrows on: 07/21/2022 12:53 PM   Modules accepted: Orders

## 2022-07-21 NOTE — Telephone Encounter (Signed)
New message   Patient states that Dr Tamala Julian changed his metoprolol presc to '25mg'$ , 1 tablet daily.  His old prescription was 1/2 pill daily.  Patient needs new presc [facility] to [facility]----[facility] Salvia at the [facility] so that he can get the new prescription.  Please call patient if you have any questions.  Patient said that Dr Thompson Caul nurse has the numbers and information for the [place].

## 2022-08-21 DIAGNOSIS — I700 Atherosclerosis of aorta: Secondary | ICD-10-CM | POA: Diagnosis not present

## 2022-08-21 DIAGNOSIS — N1831 Chronic kidney disease, stage 3a: Secondary | ICD-10-CM | POA: Diagnosis not present

## 2022-08-21 DIAGNOSIS — E782 Mixed hyperlipidemia: Secondary | ICD-10-CM | POA: Diagnosis not present

## 2022-08-21 DIAGNOSIS — E118 Type 2 diabetes mellitus with unspecified complications: Secondary | ICD-10-CM | POA: Diagnosis not present

## 2022-08-21 DIAGNOSIS — E1140 Type 2 diabetes mellitus with diabetic neuropathy, unspecified: Secondary | ICD-10-CM | POA: Diagnosis not present

## 2022-08-21 DIAGNOSIS — N183 Chronic kidney disease, stage 3 unspecified: Secondary | ICD-10-CM | POA: Diagnosis not present

## 2022-08-21 DIAGNOSIS — E559 Vitamin D deficiency, unspecified: Secondary | ICD-10-CM | POA: Diagnosis not present

## 2022-08-21 DIAGNOSIS — I2510 Atherosclerotic heart disease of native coronary artery without angina pectoris: Secondary | ICD-10-CM | POA: Diagnosis not present

## 2022-08-21 DIAGNOSIS — I10 Essential (primary) hypertension: Secondary | ICD-10-CM | POA: Diagnosis not present

## 2022-08-21 DIAGNOSIS — G4733 Obstructive sleep apnea (adult) (pediatric): Secondary | ICD-10-CM | POA: Diagnosis not present

## 2022-09-04 DIAGNOSIS — I129 Hypertensive chronic kidney disease with stage 1 through stage 4 chronic kidney disease, or unspecified chronic kidney disease: Secondary | ICD-10-CM | POA: Diagnosis not present

## 2022-09-04 DIAGNOSIS — E669 Obesity, unspecified: Secondary | ICD-10-CM | POA: Diagnosis not present

## 2022-09-04 DIAGNOSIS — N183 Chronic kidney disease, stage 3 unspecified: Secondary | ICD-10-CM | POA: Diagnosis not present

## 2022-09-04 DIAGNOSIS — N1831 Chronic kidney disease, stage 3a: Secondary | ICD-10-CM | POA: Diagnosis not present

## 2022-09-04 DIAGNOSIS — E1122 Type 2 diabetes mellitus with diabetic chronic kidney disease: Secondary | ICD-10-CM | POA: Diagnosis not present

## 2022-09-10 DIAGNOSIS — N189 Chronic kidney disease, unspecified: Secondary | ICD-10-CM | POA: Diagnosis not present

## 2022-09-10 DIAGNOSIS — N1831 Chronic kidney disease, stage 3a: Secondary | ICD-10-CM | POA: Diagnosis not present

## 2022-09-10 DIAGNOSIS — N3289 Other specified disorders of bladder: Secondary | ICD-10-CM | POA: Diagnosis not present

## 2022-09-10 DIAGNOSIS — N281 Cyst of kidney, acquired: Secondary | ICD-10-CM | POA: Diagnosis not present

## 2022-09-22 ENCOUNTER — Ambulatory Visit: Payer: Medicare HMO | Attending: Cardiovascular Disease

## 2022-09-22 DIAGNOSIS — I4819 Other persistent atrial fibrillation: Secondary | ICD-10-CM

## 2022-09-22 LAB — CBC WITH DIFFERENTIAL/PLATELET

## 2022-09-23 LAB — CBC WITH DIFFERENTIAL/PLATELET
Basophils Absolute: 0.1 10*3/uL (ref 0.0–0.2)
Basos: 1 %
EOS (ABSOLUTE): 0.1 10*3/uL (ref 0.0–0.4)
Eos: 2 %
Hematocrit: 48.2 % (ref 37.5–51.0)
Hemoglobin: 16.6 g/dL (ref 13.0–17.7)
Immature Grans (Abs): 0 10*3/uL (ref 0.0–0.1)
Immature Granulocytes: 0 %
Lymphocytes Absolute: 1.4 10*3/uL (ref 0.7–3.1)
Lymphs: 20 %
MCH: 31.7 pg (ref 26.6–33.0)
MCHC: 34.4 g/dL (ref 31.5–35.7)
MCV: 92 fL (ref 79–97)
Monocytes Absolute: 0.8 10*3/uL (ref 0.1–0.9)
Monocytes: 11 %
Neutrophils Absolute: 4.7 10*3/uL (ref 1.4–7.0)
Neutrophils: 66 %
Platelets: 154 10*3/uL (ref 150–450)
RBC: 5.24 x10E6/uL (ref 4.14–5.80)
RDW: 14.1 % (ref 11.6–15.4)
WBC: 7.1 10*3/uL (ref 3.4–10.8)

## 2022-09-23 LAB — BASIC METABOLIC PANEL
BUN/Creatinine Ratio: 15 (ref 10–24)
BUN: 21 mg/dL (ref 8–27)
CO2: 22 mmol/L (ref 20–29)
Calcium: 9.8 mg/dL (ref 8.6–10.2)
Chloride: 100 mmol/L (ref 96–106)
Creatinine, Ser: 1.42 mg/dL — ABNORMAL HIGH (ref 0.76–1.27)
Glucose: 309 mg/dL — ABNORMAL HIGH (ref 70–99)
Potassium: 4.5 mmol/L (ref 3.5–5.2)
Sodium: 137 mmol/L (ref 134–144)
eGFR: 52 mL/min/{1.73_m2} — ABNORMAL LOW (ref >59)

## 2022-09-26 ENCOUNTER — Telehealth (HOSPITAL_COMMUNITY): Payer: Self-pay | Admitting: *Deleted

## 2022-09-26 NOTE — Telephone Encounter (Signed)
Attempted to call patient regarding upcoming cardiac CT appointment. °Voicemail not set up to leave a message. ° °Dhrithi Riche RN Navigator Cardiac Imaging °[facility] °[phone] Office °[phone] Cell ° °

## 2022-09-26 NOTE — Telephone Encounter (Signed)
Patient returning call about his upcoming cardiac imaging study; pt verbalizes understanding of appt date/time, parking situation and where to check in, pre-test NPO status, and verified current allergies; name and call back number provided for further questions should they arise  Gordy Clement RN Navigator Cardiac Imaging [facility] [phone] office [phone] cell

## 2022-09-29 ENCOUNTER — Ambulatory Visit (HOSPITAL_BASED_OUTPATIENT_CLINIC_OR_DEPARTMENT_OTHER)
Admission: RE | Admit: 2022-09-29 | Discharge: 2022-09-29 | Disposition: A | Discharge status: Home / Self Care | Payer: Medicare HMO | Source: Ambulatory Visit | Attending: Cardiovascular Disease | Admitting: Cardiovascular Disease

## 2022-09-29 DIAGNOSIS — I4819 Other persistent atrial fibrillation: Secondary | ICD-10-CM | POA: Diagnosis not present

## 2022-09-29 MED ORDER — IOHEXOL 350 MG/ML SOLN
100.0000 mL | Freq: Once | INTRAVENOUS | Status: AC | PRN
  Administered 2022-09-29: 80 mL via INTRAVENOUS

## 2022-10-02 ENCOUNTER — Telehealth: Payer: Self-pay | Admitting: Cardiovascular Disease

## 2022-10-02 DIAGNOSIS — R059 Cough, unspecified: Secondary | ICD-10-CM | POA: Diagnosis not present

## 2022-10-02 DIAGNOSIS — J019 Acute sinusitis, unspecified: Secondary | ICD-10-CM | POA: Diagnosis not present

## 2022-10-02 DIAGNOSIS — Z8709 Personal history of other diseases of the respiratory system: Secondary | ICD-10-CM | POA: Diagnosis not present

## 2022-10-02 DIAGNOSIS — J209 Acute bronchitis, unspecified: Secondary | ICD-10-CM | POA: Diagnosis not present

## 2022-10-02 NOTE — Telephone Encounter (Signed)
Returned call to patient. He reports he saw his PCP today and was told he has bronchitis. He was given some medication to help with the coughing. He states he feels fine, but coupled with his history of COPD he wanted to be sure it was OK to proceed with ablation on Monday 10/06/22.  Informed patient I will forward his message to Dr. Myles Gip to review and advise. Also informed him that it may come down to the anesthesiologist making the call that morning based on how he presents.  Patient verbalized understanding and expressed appreciation for call.

## 2022-10-02 NOTE — Telephone Encounter (Signed)
Pt is having an ablation on Monday 10/06/22, but pt has bronchitis and their throat is currently sore. Pt is wanting to know if he should still get his ablation or if he should reschedule his appt. Please advise.

## 2022-10-03 NOTE — Pre-Procedure Instructions (Signed)
Instructed patient on the following items: Arrival time 0730 Nothing to eat or drink after midnight No meds AM of procedure Responsible person to drive you home and stay with you for 24 hrs  Have you missed any doses of anti-coagulant Eliquis- hasn't missed any doses, take all doses over the weekend, don't take dose on Monday

## 2022-10-05 NOTE — Anesthesia Preprocedure Evaluation (Signed)
Anesthesia Evaluation  Patient identified by MRN, date of birth, ID band Patient awake    Reviewed: Allergy & Precautions, NPO status , Patient's Chart, lab work & pertinent test results, reviewed documented beta blocker date and time   History of Anesthesia Complications Negative for: history of anesthetic complications  Airway Mallampati: I  TM Distance: >3 FB Neck ROM: Full    Dental  (+) Edentulous Upper, Edentulous Lower, Dental Advisory Given   Pulmonary sleep apnea and Continuous Positive Airway Pressure Ventilation , COPD, former smoker   Pulmonary exam normal        Cardiovascular hypertension, Pt. on medications and Pt. on home beta blockers + CAD and + Past MI  + dysrhythmias Atrial Fibrillation  Rhythm:Irregular Rate:Normal  TTE 09/2021 IMPRESSIONS     1. Left ventricular ejection fraction, by estimation, is 60 to 65%. Left  ventricular ejection fraction by 3D volume is 61 %. The left ventricle has  normal function. The left ventricle has no regional wall motion  abnormalities. Left ventricular diastolic   parameters are consistent with Grade I diastolic dysfunction (impaired  relaxation).   2. Right ventricular systolic function is normal. The right ventricular  size is normal.   3. Left atrial size was mildly dilated.   4. The mitral valve is grossly normal. Trivial mitral valve  regurgitation.   5. The aortic valve is tricuspid. Aortic valve regurgitation is not  visualized.   6. Aortic dilatation noted. There is mild dilatation of the ascending  aorta, measuring 40 mm.   7. The inferior vena cava is normal in size with greater than 50%  respiratory variability, suggesting right atrial pressure of 3 mmHg.   Comparison(s): No prior Echocardiogram.     Neuro/Psych negative neurological ROS     GI/Hepatic negative GI ROS,,,(+) Hepatitis -, B  Endo/Other  diabetes    Renal/GU Renal InsufficiencyRenal  disease (Cr 1.30)  negative genitourinary   Musculoskeletal negative musculoskeletal ROS (+)    Abdominal   Peds  Hematology negative hematology ROS (+)   Anesthesia Other Findings   Reproductive/Obstetrics                             Anesthesia Physical Anesthesia Plan  ASA: 3  Anesthesia Plan: General   Post-op Pain Management: Minimal or no pain anticipated and Tylenol PO (pre-op)*   Induction: Intravenous  PONV Risk Score and Plan: 2 and TIVA and Treatment may vary due to age or medical condition  Airway Management Planned: Oral ETT  Additional Equipment: None  Intra-op Plan:   Post-operative Plan: Extubation in OR  Informed Consent: I have reviewed the patients History and Physical, chart, labs and discussed the procedure including the risks, benefits and alternatives for the proposed anesthesia with the patient or authorized representative who has indicated his/her understanding and acceptance.     Dental advisory given  Plan Discussed with: Anesthesiologist and CRNA  Anesthesia Plan Comments:         Anesthesia Quick Evaluation

## 2022-10-06 ENCOUNTER — Ambulatory Visit (HOSPITAL_COMMUNITY): Payer: Medicare HMO | Admitting: Anesthesiology

## 2022-10-06 ENCOUNTER — Other Ambulatory Visit (HOSPITAL_COMMUNITY): Payer: Self-pay

## 2022-10-06 ENCOUNTER — Ambulatory Visit (HOSPITAL_COMMUNITY)
Admission: RE | Admit: 2022-10-06 | Discharge: 2022-10-06 | Disposition: A | Discharge status: Home / Self Care | Payer: Medicare HMO | Attending: Cardiovascular Disease | Admitting: Cardiovascular Disease

## 2022-10-06 ENCOUNTER — Encounter (HOSPITAL_COMMUNITY)
Admission: RE | Disposition: A | Discharge status: Home / Self Care | Payer: Self-pay | Source: Home / Self Care | Attending: Cardiovascular Disease

## 2022-10-06 ENCOUNTER — Other Ambulatory Visit: Payer: Self-pay

## 2022-10-06 ENCOUNTER — Ambulatory Visit (HOSPITAL_BASED_OUTPATIENT_CLINIC_OR_DEPARTMENT_OTHER): Payer: Medicare HMO | Admitting: Anesthesiology

## 2022-10-06 DIAGNOSIS — Z955 Presence of coronary angioplasty implant and graft: Secondary | ICD-10-CM | POA: Insufficient documentation

## 2022-10-06 DIAGNOSIS — Z87891 Personal history of nicotine dependence: Secondary | ICD-10-CM | POA: Insufficient documentation

## 2022-10-06 DIAGNOSIS — E1122 Type 2 diabetes mellitus with diabetic chronic kidney disease: Secondary | ICD-10-CM | POA: Insufficient documentation

## 2022-10-06 DIAGNOSIS — E1151 Type 2 diabetes mellitus with diabetic peripheral angiopathy without gangrene: Secondary | ICD-10-CM | POA: Insufficient documentation

## 2022-10-06 DIAGNOSIS — I2510 Atherosclerotic heart disease of native coronary artery without angina pectoris: Secondary | ICD-10-CM | POA: Insufficient documentation

## 2022-10-06 DIAGNOSIS — Z7984 Long term (current) use of oral hypoglycemic drugs: Secondary | ICD-10-CM | POA: Diagnosis not present

## 2022-10-06 DIAGNOSIS — I252 Old myocardial infarction: Secondary | ICD-10-CM | POA: Diagnosis not present

## 2022-10-06 DIAGNOSIS — J449 Chronic obstructive pulmonary disease, unspecified: Secondary | ICD-10-CM | POA: Diagnosis not present

## 2022-10-06 DIAGNOSIS — B1910 Unspecified viral hepatitis B without hepatic coma: Secondary | ICD-10-CM | POA: Diagnosis not present

## 2022-10-06 DIAGNOSIS — I129 Hypertensive chronic kidney disease with stage 1 through stage 4 chronic kidney disease, or unspecified chronic kidney disease: Secondary | ICD-10-CM | POA: Diagnosis not present

## 2022-10-06 DIAGNOSIS — I4819 Other persistent atrial fibrillation: Secondary | ICD-10-CM | POA: Diagnosis not present

## 2022-10-06 DIAGNOSIS — I714 Abdominal aortic aneurysm, without rupture, unspecified: Secondary | ICD-10-CM | POA: Insufficient documentation

## 2022-10-06 DIAGNOSIS — D6869 Other thrombophilia: Secondary | ICD-10-CM | POA: Diagnosis not present

## 2022-10-06 DIAGNOSIS — I10 Essential (primary) hypertension: Secondary | ICD-10-CM | POA: Diagnosis not present

## 2022-10-06 DIAGNOSIS — Z7901 Long term (current) use of anticoagulants: Secondary | ICD-10-CM | POA: Diagnosis not present

## 2022-10-06 DIAGNOSIS — I4891 Unspecified atrial fibrillation: Secondary | ICD-10-CM

## 2022-10-06 DIAGNOSIS — E785 Hyperlipidemia, unspecified: Secondary | ICD-10-CM | POA: Insufficient documentation

## 2022-10-06 DIAGNOSIS — N189 Chronic kidney disease, unspecified: Secondary | ICD-10-CM | POA: Insufficient documentation

## 2022-10-06 HISTORY — PX: ATRIAL FIBRILLATION ABLATION: EP1191

## 2022-10-06 LAB — POCT ACTIVATED CLOTTING TIME
Activated Clotting Time: 379 seconds
Activated Clotting Time: 309 seconds
Activated Clotting Time: 309 seconds
Activated Clotting Time: 314 seconds

## 2022-10-06 LAB — GLUCOSE, CAPILLARY
Glucose-Capillary: 194 mg/dL — ABNORMAL HIGH (ref 70–99)
Glucose-Capillary: 263 mg/dL — ABNORMAL HIGH (ref 70–99)

## 2022-10-06 SURGERY — ATRIAL FIBRILLATION ABLATION
Anesthesia: General

## 2022-10-06 MED ORDER — HEPARIN SODIUM (PORCINE) 1000 UNIT/ML IJ SOLN
INTRAMUSCULAR | Status: DC | PRN
  Administered 2022-10-06: 1000 [IU] via INTRAVENOUS

## 2022-10-06 MED ORDER — FENTANYL CITRATE (PF) 250 MCG/5ML IJ SOLN
INTRAMUSCULAR | Status: DC | PRN
  Administered 2022-10-06 (×2): 50 ug via INTRAVENOUS

## 2022-10-06 MED ORDER — LIDOCAINE 2% (20 MG/ML) 5 ML SYRINGE
INTRAMUSCULAR | Status: DC | PRN
  Administered 2022-10-06: 100 mg via INTRAVENOUS

## 2022-10-06 MED ORDER — DEXAMETHASONE SODIUM PHOSPHATE 10 MG/ML IJ SOLN
INTRAMUSCULAR | Status: DC | PRN
  Administered 2022-10-06: 5 mg via INTRAVENOUS

## 2022-10-06 MED ORDER — HEPARIN SODIUM (PORCINE) 1000 UNIT/ML IJ SOLN
INTRAMUSCULAR | Status: AC
  Filled 2022-10-06: qty 10

## 2022-10-06 MED ORDER — ONDANSETRON HCL 4 MG/2ML IJ SOLN: 4.0000 mg | Freq: Four times a day (QID) | INTRAMUSCULAR | Status: DC | PRN

## 2022-10-06 MED ORDER — SUGAMMADEX SODIUM 200 MG/2ML IV SOLN
INTRAVENOUS | Status: DC | PRN
  Administered 2022-10-06: 200 mg via INTRAVENOUS

## 2022-10-06 MED ORDER — HEPARIN SODIUM (PORCINE) 1000 UNIT/ML IJ SOLN
INTRAMUSCULAR | Status: DC | PRN
  Administered 2022-10-06: 18000 [IU] via INTRAVENOUS

## 2022-10-06 MED ORDER — ACETAMINOPHEN 500 MG PO TABS
1000.0000 mg | ORAL_TABLET | Freq: Once | ORAL | Status: AC
  Administered 2022-10-06: 1000 mg via ORAL
  Filled 2022-10-06: qty 2

## 2022-10-06 MED ORDER — SODIUM CHLORIDE 0.9 % IV SOLN: 250.0000 mL | INTRAVENOUS | Status: DC | PRN

## 2022-10-06 MED ORDER — PROPOFOL 10 MG/ML IV BOLUS
INTRAVENOUS | Status: DC | PRN
  Administered 2022-10-06: 30 mg via INTRAVENOUS
  Administered 2022-10-06: 100 mg via INTRAVENOUS

## 2022-10-06 MED ORDER — SODIUM CHLORIDE 0.9% FLUSH: 3.0000 mL | Freq: Two times a day (BID) | INTRAVENOUS | Status: DC

## 2022-10-06 MED ORDER — ROCURONIUM BROMIDE 10 MG/ML (PF) SYRINGE
PREFILLED_SYRINGE | INTRAVENOUS | Status: DC | PRN
  Administered 2022-10-06: 20 mg via INTRAVENOUS
  Administered 2022-10-06: 50 mg via INTRAVENOUS

## 2022-10-06 MED ORDER — SODIUM CHLORIDE 0.9 % IV SOLN: INTRAVENOUS | Status: DC

## 2022-10-06 MED ORDER — DOBUTAMINE INFUSION FOR EP/ECHO/NUC (1000 MCG/ML)
INTRAVENOUS | Status: DC | PRN
  Administered 2022-10-06: 20 ug/kg/min via INTRAVENOUS

## 2022-10-06 MED ORDER — ONDANSETRON HCL 4 MG/2ML IJ SOLN
INTRAMUSCULAR | Status: DC | PRN
  Administered 2022-10-06: 4 mg via INTRAVENOUS

## 2022-10-06 MED ORDER — COLCHICINE 0.6 MG PO TABS
0.6000 mg | ORAL_TABLET | Freq: Two times a day (BID) | ORAL | 0 refills | Status: DC
  Filled 2022-10-06: qty 10, 5d supply, fill #0

## 2022-10-06 MED ORDER — DOBUTAMINE INFUSION FOR EP/ECHO/NUC (1000 MCG/ML)
INTRAVENOUS | Status: AC
  Filled 2022-10-06: qty 250

## 2022-10-06 MED ORDER — HEPARIN (PORCINE) IN NACL 1000-0.9 UT/500ML-% IV SOLN
INTRAVENOUS | Status: DC | PRN
  Administered 2022-10-06 (×4): 500 mL

## 2022-10-06 MED ORDER — SODIUM CHLORIDE 0.9% FLUSH: 3.0000 mL | INTRAVENOUS | Status: DC | PRN

## 2022-10-06 MED ORDER — PROTAMINE SULFATE 10 MG/ML IV SOLN
INTRAVENOUS | Status: DC | PRN
  Administered 2022-10-06: 50 mg via INTRAVENOUS

## 2022-10-06 MED ORDER — PHENYLEPHRINE 80 MCG/ML (10ML) SYRINGE FOR IV PUSH (FOR BLOOD PRESSURE SUPPORT)
PREFILLED_SYRINGE | INTRAVENOUS | Status: DC | PRN
  Administered 2022-10-06: 40 ug via INTRAVENOUS

## 2022-10-06 MED ORDER — ACETAMINOPHEN 325 MG PO TABS: 650.0000 mg | ORAL_TABLET | ORAL | Status: DC | PRN

## 2022-10-06 MED ORDER — PHENYLEPHRINE HCL-NACL 20-0.9 MG/250ML-% IV SOLN
INTRAVENOUS | Status: DC | PRN
  Administered 2022-10-06: 20 ug/min via INTRAVENOUS

## 2022-10-06 SURGICAL SUPPLY — 19 items
BAG SNAP BAND KOVER 36X36 (MISCELLANEOUS) IMPLANT
CATH ABLAT QDOT MICRO BI TC DF (CATHETERS) IMPLANT
CATH OCTARAY 2.0 F 3-3-3-3-3 (CATHETERS) IMPLANT
CATH PIGTAIL STEERABLE D1 8.7 (WIRE) IMPLANT
CATH S-M CIRCA TEMP PROBE (CATHETERS) IMPLANT
CATH SOUNDSTAR ECO 8FR (CATHETERS) IMPLANT
CATH WEB BI DIR CSDF CRV REPRO (CATHETERS) IMPLANT
CLOSURE PERCLOSE PROSTYLE (VASCULAR PRODUCTS) IMPLANT
COVER SWIFTLINK CONNECTOR (BAG) ×1 IMPLANT
DEVICE CLOSURE MYNXGRIP 6/7F (Vascular Products) IMPLANT
PACK EP LATEX FREE (CUSTOM PROCEDURE TRAY) ×1
PACK EP LF (CUSTOM PROCEDURE TRAY) ×1 IMPLANT
PAD DEFIB RADIO PHYSIO CONN (PAD) ×1 IMPLANT
PATCH CARTO3 (PAD) IMPLANT
SHEATH CARTO VIZIGO MED CURVE (SHEATH) IMPLANT
SHEATH PINNACLE 8F 10CM (SHEATH) IMPLANT
SHEATH PINNACLE 9F 10CM (SHEATH) IMPLANT
SHEATH PROBE COVER 6X72 (BAG) IMPLANT
TUBING SMART ABLATE COOLFLOW (TUBING) IMPLANT

## 2022-10-06 NOTE — Discharge Instructions (Signed)

## 2022-10-06 NOTE — Progress Notes (Signed)
Pt ambulated to and from bathroom to void with no oozing from bilateral groin sites 

## 2022-10-06 NOTE — Anesthesia Procedure Notes (Signed)
Procedure Name: Intubation Date/Time: 10/06/2022 10:22 AM  Performed by: Amadeo Garnet, CRNAPre-anesthesia Checklist: Patient identified, Emergency Drugs available, Suction available and Patient being monitored Patient Re-evaluated:Patient Re-evaluated prior to induction Oxygen Delivery Method: Circle system utilized Preoxygenation: Pre-oxygenation with 100% oxygen Induction Type: IV induction Ventilation: Mask ventilation with difficulty, Two handed mask ventilation required and Oral airway inserted - appropriate to patient size Laryngoscope Size: Mac and 4 Grade View: Grade I Tube type: Oral Tube size: 7.5 mm Number of attempts: 1 Airway Equipment and Method: Stylet and Oral airway Placement Confirmation: ETT inserted through vocal cords under direct vision, positive ETCO2 and breath sounds checked- equal and bilateral Secured at: 23 cm Tube secured with: Tape Dental Injury: Teeth and Oropharynx as per pre-operative assessment

## 2022-10-06 NOTE — Progress Notes (Signed)
Patient received from [facility].  Patient in bed and hooked up to monitors.   Assessment done. Sites look great. No signs of hematoma or swelling.   EKG and blood sugar taken by Hedy NT.   Blood sugar was 263 at 1309, Dr. Myles Gip made aware per orders.   Patient in bed and currently being monitored.

## 2022-10-06 NOTE — H&P (Signed)
Electrophysiology Office Note:    Date:  10/06/2022   ID:  Samuel Watkins, DOB 09/19/1945, MRN [id number]  PCP:  Lawerance Cruel, [place] Prov[facility] Cardiologist:  Sinclair Grooms, MD (Inactive) Electrophysiologist:  Melida Quitter, MD     Referring MD: No ref. provider found   History of Present Illness:    Samuel Watkins is a 77 y.o. male with a hx listed below, significant for CAD, multiple DES, PAD, DM 2, hypertension, hyperlipidemia, AAA s/p repair, atrial fibrillation, referred for arrhythmia management.  He underwent DC cardioversion in August 2023 and had early recurrence of AF.  He reports that he is very symptomatic with atrial fibrillation.  He is easily fatigued and winded.  He not enjoy doing the things he joined doing in the past due to to fatigue and shortness of breath.  He does not have chest pain, syncope, presyncope.  I reviewed the patient's CT and labs. There was no LAA thrombus. he  has not missed any doses of anticoagulation, and he took his dose last night. There have been no changes in the patient's diagnoses, medications, or condition since our recent clinic visit.   Past Medical History:  Diagnosis Date   CAD (coronary artery disease)    Chronic hepatitis B (HCC)    CKD (chronic kidney disease)    COPD (chronic obstructive pulmonary disease) (HCC)    Diabetes mellitus without complication (HCC)    Hyperlipemia    Hypertension    Myocardial infarction (HCC)    OSA on CPAP    Peripheral neuropathy     Past Surgical History:  Procedure Laterality Date   ABDOMINAL AORTIC ANEURYSM REPAIR     CARDIOVERSION N/A 02/14/2022   Procedure: CARDIOVERSION;  Surgeon: Lelon Perla, MD;  Location: [facility];  Service: Cardiovascular;  Laterality: N/A;   LUMBAR [place]     found spot on lung 10 years ago    Current Medications: Current Meds  Medication Sig   albuterol (PROVENTIL HFA;VENTOLIN HFA)  108 (90 BASE) MCG/ACT inhaler Inhale 2 puffs into the lungs 4 (four) times daily as needed for wheezing or shortness of breath.   amLODipine (NORVASC) 10 MG tablet Take 1 tablet (10 mg total) by mouth daily.   apixaban (ELIQUIS) 5 MG TABS tablet Take 1 tablet (5 mg total) by mouth 2 (two) times daily.   atorvastatin (LIPITOR) 80 MG tablet Take 80 mg by mouth at bedtime.   Cholecalciferol (VITAMIN D3) 5000 units CAPS Take 5,000 Units by mouth daily.   empagliflozin (JARDIANCE) 25 MG TABS tablet Take 12.5 mg by mouth daily.   gabapentin (NEURONTIN) 300 MG capsule Take 300 mg by mouth at bedtime.   glimepiride (AMARYL) 2 MG tablet Take 2 mg by mouth daily with breakfast.   losartan-hydrochlorothiazide (HYZAAR) 50-12.5 MG tablet Take 1 tablet by mouth daily.   Magnesium Oxide 420 MG TABS Take 2 tablets by mouth See admin instructions. Take 2 tablets daily except skip dose on Sundays   metFORMIN (GLUCOPHAGE-XR) 500 MG 24 hr tablet Take 1,000 mg by mouth 2 (two) times daily.   metoprolol succinate (TOPROL-XL) 50 MG 24 hr tablet Take 25 mg by mouth daily.   nitroGLYCERIN (NITROSTAT) 0.4 MG SL tablet Place 0.4 mg under the tongue every 5 (five) minutes x 3 doses as needed for chest pain (Call 911 at 3rd dose within 15 minutes.).   pantoprazole (PROTONIX) 40 MG  tablet Take 40 mg by mouth daily.   pioglitazone (ACTOS) 30 MG tablet Take 15 mg by mouth daily.   Tiotropium Bromide-Olodaterol (STIOLTO RESPIMAT) 2.5-2.5 MCG/ACT AERS Inhale into the lungs.     Allergies:   Patient has no known allergies.   Social History   Socioeconomic History   Marital status: Married    Spouse name: [name]   Number of children: 3   Years of education: 12   Highest education level: Not on file  Occupational History   Occupation: retired  Tobacco Use   Smoking status: Former    Packs/day: 1.00    Years: 50.00    Additional pack years: 0.00    Total pack years: 50.00    Types: Cigarettes    Quit date: 07/14/2010     Years since quitting: 12.2   Smokeless tobacco: Never   Tobacco comments:    Counseled to remain smoke free.  Vaping Use   Vaping Use: Never used  Substance and Sexual Activity   Alcohol use: No    Alcohol/week: 0.0 standard drinks of alcohol   Drug use: No   Sexual activity: Not on file  Other Topics Concern   Not on file  Social History Narrative   Lives with wife   Caffeine- coffee 2 c, soda 1, tea 1   Social Determinants of Health   Financial Resource Strain: Not on file  Food Insecurity: Not on file  Transportation Needs: Not on file  Physical Activity: Not on file  Stress: Not on file  Social Connections: Not on file     Family History: The patient's family history includes Heart disease in his father and mother; Throat cancer in his brother.  ROS:   Please see the history of present illness.    All other systems reviewed and are negative.  EKGs/Labs/Other Studies Reviewed Today:     TTE 09/2021: normal EF, LA mildly dilated  EKG:  Last EKG results: today - AF   Recent Labs: 12/24/2021: ALT 23; Magnesium 2.0; TSH 1.900 09/22/2022: BUN 21; Creatinine, Ser 1.42; Hemoglobin 16.6; Platelets 154; Potassium 4.5; Sodium 137     Physical Exam:    VS:  BP 122/81   Pulse 72   Temp (!) 97.1 F (36.2 C)   Resp 17   Ht 6\' 1"  (1.854 m)   Wt 102.5 kg   SpO2 95%   BMI 29.82 kg/m     Wt Readings from Last 3 Encounters:  10/06/22 102.5 kg  07/21/22 102.5 kg  06/03/22 103.4 kg     GEN: Well nourished, well developed in no acute distress CARDIAC: iRRR, no murmurs, rubs, gallops RESPIRATORY:  Normal work of breathing MUSCULOSKELETAL: no edema    ASSESSMENT & PLAN:    Persistent AF: symptomatic. Failed DC cardioversion. We will  proceed with AF ablation today.  Secondary hypercoagulable state: continue anticoagulation Hepatitis B COPD         Signed, Melida Quitter, MD  10/06/2022 9:46 AM    [place]

## 2022-10-06 NOTE — Transfer of Care (Addendum)
Immediate Anesthesia Transfer of Care Note  Patient: Samuel Watkins  Procedure(s) Performed: ATRIAL FIBRILLATION ABLATION  Patient Location: PACU  Anesthesia Type:General  Level of Consciousness: awake, alert , and oriented  Airway & Oxygen Therapy: Patient Spontanous Breathing and Patient connected to face mask oxygen  Post-op Assessment: Report given to RN, Post -op Vital signs reviewed and stable, and Patient moving all extremities X 4  Post vital signs: Reviewed and stable  Last Vitals:  Vitals Value Taken Time  BP 112/66   Temp    Pulse 60   Resp 16   SpO2 92     Last Pain:  Vitals:   10/06/22 0748  PainSc: 0-No pain         Complications: There were no known notable events for this encounter.

## 2022-10-06 NOTE — Anesthesia Postprocedure Evaluation (Signed)
Anesthesia Post Note  Patient: Samuel Watkins  Procedure(s) Performed: ATRIAL FIBRILLATION ABLATION     Patient location during evaluation: PACU Anesthesia Type: General Level of consciousness: sedated Pain management: pain level controlled Vital Signs Assessment: post-procedure vital signs reviewed and stable Respiratory status: spontaneous breathing and respiratory function stable Cardiovascular status: stable Postop Assessment: no apparent nausea or vomiting Anesthetic complications: no  There were no known notable events for this encounter.  Last Vitals:  Vitals:   10/06/22 1309 10/06/22 1341  BP:    Pulse: 60   Resp: 19   Temp:  36.6 C  SpO2: (!) 88%     Last Pain:  Vitals:   10/06/22 1355  TempSrc:   PainSc: 0-No pain                 Janiene Aarons DANIEL

## 2022-10-07 ENCOUNTER — Encounter (HOSPITAL_COMMUNITY): Payer: Self-pay | Admitting: Cardiovascular Disease

## 2022-11-03 ENCOUNTER — Encounter (HOSPITAL_COMMUNITY): Payer: Self-pay | Admitting: Physician Assistant

## 2022-11-03 ENCOUNTER — Ambulatory Visit (HOSPITAL_COMMUNITY)
Admission: RE | Admit: 2022-11-03 | Discharge: 2022-11-03 | Disposition: A | Discharge status: Home / Self Care | Payer: Medicare HMO | Source: Ambulatory Visit | Attending: Physician Assistant | Admitting: Physician Assistant

## 2022-11-03 VITALS — BP 100/62 | HR 77 | Ht 73.0 in | Wt 228.6 lb

## 2022-11-03 DIAGNOSIS — E1151 Type 2 diabetes mellitus with diabetic peripheral angiopathy without gangrene: Secondary | ICD-10-CM | POA: Insufficient documentation

## 2022-11-03 DIAGNOSIS — D6869 Other thrombophilia: Secondary | ICD-10-CM | POA: Diagnosis not present

## 2022-11-03 DIAGNOSIS — Z7984 Long term (current) use of oral hypoglycemic drugs: Secondary | ICD-10-CM | POA: Insufficient documentation

## 2022-11-03 DIAGNOSIS — E785 Hyperlipidemia, unspecified: Secondary | ICD-10-CM | POA: Insufficient documentation

## 2022-11-03 DIAGNOSIS — I4819 Other persistent atrial fibrillation: Secondary | ICD-10-CM | POA: Diagnosis not present

## 2022-11-03 DIAGNOSIS — Z79899 Other long term (current) drug therapy: Secondary | ICD-10-CM | POA: Insufficient documentation

## 2022-11-03 DIAGNOSIS — I10 Essential (primary) hypertension: Secondary | ICD-10-CM | POA: Diagnosis not present

## 2022-11-03 DIAGNOSIS — Z7901 Long term (current) use of anticoagulants: Secondary | ICD-10-CM | POA: Insufficient documentation

## 2022-11-03 DIAGNOSIS — I2510 Atherosclerotic heart disease of native coronary artery without angina pectoris: Secondary | ICD-10-CM | POA: Diagnosis not present

## 2022-11-03 LAB — BASIC METABOLIC PANEL
Anion gap: 11 (ref 5–15)
BUN: 20 mg/dL (ref 8–23)
CO2: 21 mmol/L — ABNORMAL LOW (ref 22–32)
Calcium: 8.7 mg/dL — ABNORMAL LOW (ref 8.9–10.3)
Chloride: 103 mmol/L (ref 98–111)
Creatinine, Ser: 1.54 mg/dL — ABNORMAL HIGH (ref 0.61–1.24)
GFR, Estimated: 47 mL/min — ABNORMAL LOW (ref >60)
Glucose, Bld: 289 mg/dL — ABNORMAL HIGH (ref 70–99)
Potassium: 4.1 mmol/L (ref 3.5–5.1)
Sodium: 135 mmol/L (ref 135–145)

## 2022-11-03 LAB — CBC
HCT: 47.7 % (ref 39.0–52.0)
Hemoglobin: 16.2 g/dL (ref 13.0–17.0)
MCH: 32 pg (ref 26.0–34.0)
MCHC: 34 g/dL (ref 30.0–36.0)
MCV: 94.1 fL (ref 80.0–100.0)
Platelets: 159 10*3/uL (ref 150–400)
RBC: 5.07 MIL/uL (ref 4.22–5.81)
RDW: 14.6 % (ref 11.5–15.5)
WBC: 6.9 10*3/uL (ref 4.0–10.5)
nRBC: 0 % (ref 0.0–0.2)

## 2022-11-03 NOTE — H&P (View-Only) (Signed)
  Primary Care Physician: Ross, Charles Alan, MD Primary Cardiologist: Dr Skains (former Dr Smith pt) Primary Electrophysiologist: Dr Mealor Referring Physician: Dr Mealor    Samuel Watkins is a 77 y.o. male with a history of CAD, PAD, DM, COPD, HTN, HLD, AAA s/p repair, atrial fibrillation who presents for follow up in the [facility]. He underwent DC cardioversion in August 2023 and had early recurrence of AF. He reports that he is very symptomatic with atrial fibrillation with fatigue and SOB with exertion. He underwent afib ablation with Dr Mealor on 10/06/22. Patient is on Eliquis for a CHADS2VASC score of 3.  On follow up today, patient reports that he has not felt much different post ablation. He is in rate controlled afib today with symptoms of fatigue, legs feel heavy. He denies chest pain, swallowing pain, or groin issues.   Today, he denies symptoms of palpitations, chest pain, shortness of breath, orthopnea, PND, lower extremity edema, dizziness, presyncope, syncope, snoring, daytime somnolence, bleeding, or neurologic sequela. The patient is tolerating medications without difficulties and is otherwise without complaint today.    Atrial Fibrillation Risk Factors:  he does not have symptoms or diagnosis of sleep apnea. he does not have a history of rheumatic fever.   he has a BMI of Body mass index is 30.16 kg/m.. Filed Weights   11/03/22 1051  Weight: 103.7 kg    Family History  Problem Relation Age of Onset   Throat cancer Brother    Heart disease Father    Heart disease Mother      Atrial Fibrillation Management history:  Previous antiarrhythmic drugs: none Previous cardioversions: 02/2022 Previous ablations: 10/06/22 Anticoagulation history: Eliquis   Past Medical History:  Diagnosis Date   CAD (coronary artery disease)    Chronic hepatitis B    CKD (chronic kidney disease)    COPD (chronic obstructive pulmonary disease)     Diabetes mellitus without complication    Hyperlipemia    Hypertension    Myocardial infarction    OSA on CPAP    Peripheral neuropathy    Past Surgical History:  Procedure Laterality Date   ABDOMINAL AORTIC ANEURYSM REPAIR     ATRIAL FIBRILLATION ABLATION N/A 10/06/2022   Procedure: ATRIAL FIBRILLATION ABLATION;  Surgeon: Mealor, Augustus E, MD;  [facility];  Service: Cardiovascular;  Laterality: N/A;   CARDIOVERSION N/A 02/14/2022   Procedure: CARDIOVERSION;  Surgeon: Crenshaw, Brian S, MD;  Location: [facility];  Service: Cardiovascular;  Laterality: N/A;   LUMBAR DISC SURGERY  1997   LUNG SURGERY     found spot on lung 10 years ago    Current Outpatient Medications  Medication Sig Dispense Refill   albuterol (PROVENTIL HFA;VENTOLIN HFA) 108 (90 BASE) MCG/ACT inhaler Inhale 2 puffs into the lungs 4 (four) times daily as needed for wheezing or shortness of breath.     amLODipine (NORVASC) 10 MG tablet Take 1 tablet (10 mg total) by mouth daily. 90 tablet 3   apixaban (ELIQUIS) 5 MG TABS tablet Take 1 tablet (5 mg total) by mouth 2 (two) times daily. 180 tablet 3   atorvastatin (LIPITOR) 80 MG tablet Take 80 mg by mouth at bedtime.     Cholecalciferol (VITAMIN D3) 5000 units CAPS Take 5,000 Units by mouth daily.     empagliflozin (JARDIANCE) 25 MG TABS tablet Take 12.5 mg by mouth daily.     gabapentin (NEURONTIN) 300 MG capsule Take 300 mg by mouth at   bedtime.     glimepiride (AMARYL) 2 MG tablet Take 2 mg by mouth daily with breakfast.     glucose blood (ACCU-CHEK SMARTVIEW) test strip See admin instructions.     losartan-hydrochlorothiazide (HYZAAR) 50-12.5 MG tablet Take 1 tablet by mouth daily. 90 tablet 3   Magnesium Oxide 420 MG TABS Take 2 tablets by mouth See admin instructions. Take 2 tablets daily except skip dose on Sundays     metFORMIN (GLUCOPHAGE-XR) 500 MG 24 hr tablet Take 1,000 mg by mouth 2 (two) times daily.     metoprolol succinate (TOPROL-XL)  50 MG 24 hr tablet Take 25 mg by mouth daily.     nitroGLYCERIN (NITROSTAT) 0.4 MG SL tablet Place 0.4 mg under the tongue every 5 (five) minutes x 3 doses as needed for chest pain (Call 911 at 3rd dose within 15 minutes.).     pantoprazole (PROTONIX) 40 MG tablet Take 40 mg by mouth daily.     pioglitazone (ACTOS) 30 MG tablet Take 15 mg by mouth daily.     Tiotropium Bromide-Olodaterol (STIOLTO RESPIMAT) 2.5-2.5 MCG/ACT AERS Inhale into the lungs.     colchicine 0.6 MG tablet Take 1 tablet (0.6 mg total) by mouth 2 (two) times daily for 5 days. 10 tablet 0   No current facility-administered medications for this encounter.    No Known Allergies  Social History   Socioeconomic History   Marital status: Married    Spouse name: Beverly   Number of children: 3   Years of education: 12   Highest education level: Not on file  Occupational History   Occupation: retired  Tobacco Use   Smoking status: Former    Packs/day: 1.00    Years: 50.00    Additional pack years: 0.00    Total pack years: 50.00    Types: Cigarettes    Quit date: 07/14/2010    Years since quitting: 12.3   Smokeless tobacco: Never   Tobacco comments:    Former smoker 11/03/22  Vaping Use   Vaping Use: Never used  Substance and Sexual Activity   Alcohol use: No    Alcohol/week: 0.0 standard drinks of alcohol   Drug use: No   Sexual activity: Not on file  Other Topics Concern   Not on file  Social History Narrative   Lives with wife   Caffeine- coffee 2 c, soda 1, tea 1   Social Determinants of Health   Financial Resource Strain: Not on file  Food Insecurity: Not on file  Transportation Needs: Not on file  Physical Activity: Not on file  Stress: Not on file  Social Connections: Not on file  Intimate Partner Violence: Not on file     ROS- All systems are reviewed and negative except as per the HPI above.  Physical Exam: Vitals:   11/03/22 1051  BP: 100/62  Pulse: 77  Weight: 103.7 kg  Height:  6' 1" (1.854 m)    GEN- The patient is a well appearing male, alert and oriented x 3 today.   Head- normocephalic, atraumatic Eyes-  Sclera clear, conjunctiva pink Ears- hearing intact Oropharynx- clear Neck- supple  Lungs- Clear to ausculation bilaterally, normal work of breathing Heart- irregular rate and rhythm, no murmurs, rubs or gallops  GI- soft, NT, ND, + BS Extremities- no clubbing, cyanosis, or edema MS- no significant deformity or atrophy Skin- no rash or lesion Psych- euthymic mood, full affect Neuro- strength and sensation are intact  Wt Readings from Last 3   Encounters:  11/03/22 103.7 kg  10/06/22 102.5 kg  07/21/22 102.5 kg    EKG today demonstrates  Afib, RBBB Vent. rate 77 BPM PR interval * ms QRS duration 152 ms QT/QTcB 406/459 ms  Echo 09/23/21 demonstrated  1. Left ventricular ejection fraction, by estimation, is 60 to 65%. Left  ventricular ejection fraction by 3D volume is 61 %. The left ventricle has  normal function. The left ventricle has no regional wall motion  abnormalities. Left ventricular diastolic parameters are consistent with Grade I diastolic dysfunction (impaired relaxation).   2. Right ventricular systolic function is normal. The right ventricular  size is normal.   3. Left atrial size was mildly dilated.   4. The mitral valve is grossly normal. Trivial mitral valve  regurgitation.   5. The aortic valve is tricuspid. Aortic valve regurgitation is not  visualized.   6. Aortic dilatation noted. There is mild dilatation of the ascending  aorta, measuring 40 mm.   7. The inferior vena cava is normal in size with greater than 50%  respiratory variability, suggesting right atrial pressure of 3 mmHg.    Epic records are reviewed at length today.  CHA2DS2-VASc Score = 3  The patient's score is based upon: CHF History: 0 HTN History: 1 Diabetes History: 0 Stroke History: 0 Vascular Disease History: 1 Age Score: 1 Gender Score: 0        ASSESSMENT AND PLAN: 1. Persistent Atrial Fibrillation (ICD10:  I48.19) The patient's CHA2DS2-VASc score is 3, indicating a 3.2% annual risk of stroke.   S/p afib ablation 10/06/22 Patient back in rate controlled afib. We discussed that it is not unusual for patients to have recurrence of afib within the first 3 months post ablation. We discussed rhythm control options. Will plan for DCCV.  Continue Eliquis 5 mg BID with no missed doses for 3 months post ablation.  Continue Toprol 25 mg daily  2. Secondary Hypercoagulable State (ICD10:  D68.69) The patient is at significant risk for stroke/thromboembolism based upon his CHA2DS2-VASc Score of 3.  Continue Apixaban (Eliquis).   3. CAD S/p multiple DES No anginal symptoms.  Patient has visit to establish care with Dr Skains.   4. HTN Stable, no changes today.    Follow up with Dr Skains and Dr Mealor as scheduled.    Ricky Damier Disano PA-C [facility] [address] [place], [place] [address] [phone] 11/03/2022 11:34 AM  

## 2022-11-03 NOTE — Patient Instructions (Signed)
Cardioversion scheduled for: Wednesday, May 1st   - Arrive at the [facility] and go to admitting at 1130am   - Do not eat or drink anything after midnight the night prior to your procedure.   - Take all your morning medication (except diabetic medications) with a sip of water prior to arrival.  - You will not be able to drive home after your procedure.    - Do NOT miss any doses of your blood thinner - if you should miss a dose please notify our office immediately.   - If you feel as if you go back into normal rhythm prior to scheduled cardioversion, please notify our office immediately.   If your procedure is canceled in the cardioversion suite you will be charged a cancellation fee.

## 2022-11-03 NOTE — Progress Notes (Signed)
Primary Care Physician: Daisy Floro, MD Primary Cardiologist: Dr Anne Fu (former Dr Katrinka Blazing pt) Primary Electrophysiologist: Dr Nelly Laurence Referring Physician: Dr Alger Memos is a 77 y.o. male with a history of CAD, PAD, DM, COPD, HTN, HLD, AAA s/p repair, atrial fibrillation who presents for follow up in the [facility]. He underwent DC cardioversion in August 2023 and had early recurrence of AF. He reports that he is very symptomatic with atrial fibrillation with fatigue and SOB with exertion. He underwent afib ablation with Dr Nelly Laurence on 10/06/22. Patient is on Eliquis for a CHADS2VASC score of 3.  On follow up today, patient reports that he has not felt much different post ablation. He is in rate controlled afib today with symptoms of fatigue, legs feel heavy. He denies chest pain, swallowing pain, or groin issues.   Today, he denies symptoms of palpitations, chest pain, shortness of breath, orthopnea, PND, lower extremity edema, dizziness, presyncope, syncope, snoring, daytime somnolence, bleeding, or neurologic sequela. The patient is tolerating medications without difficulties and is otherwise without complaint today.    Atrial Fibrillation Risk Factors:  he does not have symptoms or diagnosis of sleep apnea. he does not have a history of rheumatic fever.   he has a BMI of Body mass index is 30.16 kg/m.Marland Kitchen Filed Weights   11/03/22 1051  Weight: 103.7 kg    Family History  Problem Relation Age of Onset   Throat cancer Brother    Heart disease Father    Heart disease Mother      Atrial Fibrillation Management history:  Previous antiarrhythmic drugs: none Previous cardioversions: 02/2022 Previous ablations: 10/06/22 Anticoagulation history: Eliquis   Past Medical History:  Diagnosis Date   CAD (coronary artery disease)    Chronic hepatitis B    CKD (chronic kidney disease)    COPD (chronic obstructive pulmonary disease)     Diabetes mellitus without complication    Hyperlipemia    Hypertension    Myocardial infarction    OSA on CPAP    Peripheral neuropathy    Past Surgical History:  Procedure Laterality Date   ABDOMINAL AORTIC ANEURYSM REPAIR     ATRIAL FIBRILLATION ABLATION N/A 10/06/2022   Procedure: ATRIAL FIBRILLATION ABLATION;  Surgeon: Maurice Small, MD;  [facility];  Service: Cardiovascular;  Laterality: N/A;   CARDIOVERSION N/A 02/14/2022   Procedure: CARDIOVERSION;  Surgeon: Lewayne Bunting, MD;  Location: [facility];  Service: Cardiovascular;  Laterality: N/A;   LUMBAR DISC SURGERY  1997   LUNG SURGERY     found spot on lung 10 years ago    Current Outpatient Medications  Medication Sig Dispense Refill   albuterol (PROVENTIL HFA;VENTOLIN HFA) 108 (90 BASE) MCG/ACT inhaler Inhale 2 puffs into the lungs 4 (four) times daily as needed for wheezing or shortness of breath.     amLODipine (NORVASC) 10 MG tablet Take 1 tablet (10 mg total) by mouth daily. 90 tablet 3   apixaban (ELIQUIS) 5 MG TABS tablet Take 1 tablet (5 mg total) by mouth 2 (two) times daily. 180 tablet 3   atorvastatin (LIPITOR) 80 MG tablet Take 80 mg by mouth at bedtime.     Cholecalciferol (VITAMIN D3) 5000 units CAPS Take 5,000 Units by mouth daily.     empagliflozin (JARDIANCE) 25 MG TABS tablet Take 12.5 mg by mouth daily.     gabapentin (NEURONTIN) 300 MG capsule Take 300 mg by mouth at  bedtime.     glimepiride (AMARYL) 2 MG tablet Take 2 mg by mouth daily with breakfast.     glucose blood (ACCU-CHEK SMARTVIEW) test strip See admin instructions.     losartan-hydrochlorothiazide (HYZAAR) 50-12.5 MG tablet Take 1 tablet by mouth daily. 90 tablet 3   Magnesium Oxide 420 MG TABS Take 2 tablets by mouth See admin instructions. Take 2 tablets daily except skip dose on Sundays     metFORMIN (GLUCOPHAGE-XR) 500 MG 24 hr tablet Take 1,000 mg by mouth 2 (two) times daily.     metoprolol succinate (TOPROL-XL)  50 MG 24 hr tablet Take 25 mg by mouth daily.     nitroGLYCERIN (NITROSTAT) 0.4 MG SL tablet Place 0.4 mg under the tongue every 5 (five) minutes x 3 doses as needed for chest pain (Call 911 at 3rd dose within 15 minutes.).     pantoprazole (PROTONIX) 40 MG tablet Take 40 mg by mouth daily.     pioglitazone (ACTOS) 30 MG tablet Take 15 mg by mouth daily.     Tiotropium Bromide-Olodaterol (STIOLTO RESPIMAT) 2.5-2.5 MCG/ACT AERS Inhale into the lungs.     colchicine 0.6 MG tablet Take 1 tablet (0.6 mg total) by mouth 2 (two) times daily for 5 days. 10 tablet 0   No current facility-administered medications for this encounter.    No Known Allergies  Social History   Socioeconomic History   Marital status: Married    Spouse name: Meriam Sprague   Number of children: 3   Years of education: 12   Highest education level: Not on file  Occupational History   Occupation: retired  Tobacco Use   Smoking status: Former    Packs/day: 1.00    Years: 50.00    Additional pack years: 0.00    Total pack years: 50.00    Types: Cigarettes    Quit date: 07/14/2010    Years since quitting: 12.3   Smokeless tobacco: Never   Tobacco comments:    Former smoker 11/03/22  Vaping Use   Vaping Use: Never used  Substance and Sexual Activity   Alcohol use: No    Alcohol/week: 0.0 standard drinks of alcohol   Drug use: No   Sexual activity: Not on file  Other Topics Concern   Not on file  Social History Narrative   Lives with wife   Caffeine- coffee 2 c, soda 1, tea 1   Social Determinants of Health   Financial Resource Strain: Not on file  Food Insecurity: Not on file  Transportation Needs: Not on file  Physical Activity: Not on file  Stress: Not on file  Social Connections: Not on file  Intimate Partner Violence: Not on file     ROS- All systems are reviewed and negative except as per the HPI above.  Physical Exam: Vitals:   11/03/22 1051  BP: 100/62  Pulse: 77  Weight: 103.7 kg  Height:  6\' 1"  (1.854 m)    GEN- The patient is a well appearing male, alert and oriented x 3 today.   Head- normocephalic, atraumatic Eyes-  Sclera clear, conjunctiva pink Ears- hearing intact Oropharynx- clear Neck- supple  Lungs- Clear to ausculation bilaterally, normal work of breathing Heart- irregular rate and rhythm, no murmurs, rubs or gallops  GI- soft, NT, ND, + BS Extremities- no clubbing, cyanosis, or edema MS- no significant deformity or atrophy Skin- no rash or lesion Psych- euthymic mood, full affect Neuro- strength and sensation are intact  Wt Readings from Last 3  Encounters:  11/03/22 103.7 kg  10/06/22 102.5 kg  07/21/22 102.5 kg    EKG today demonstrates  Afib, RBBB Vent. rate 77 BPM PR interval * ms QRS duration 152 ms QT/QTcB 406/459 ms  Echo 09/23/21 demonstrated  1. Left ventricular ejection fraction, by estimation, is 60 to 65%. Left  ventricular ejection fraction by 3D volume is 61 %. The left ventricle has  normal function. The left ventricle has no regional wall motion  abnormalities. Left ventricular diastolic parameters are consistent with Grade I diastolic dysfunction (impaired relaxation).   2. Right ventricular systolic function is normal. The right ventricular  size is normal.   3. Left atrial size was mildly dilated.   4. The mitral valve is grossly normal. Trivial mitral valve  regurgitation.   5. The aortic valve is tricuspid. Aortic valve regurgitation is not  visualized.   6. Aortic dilatation noted. There is mild dilatation of the ascending  aorta, measuring 40 mm.   7. The inferior vena cava is normal in size with greater than 50%  respiratory variability, suggesting right atrial pressure of 3 mmHg.    Epic records are reviewed at length today.  CHA2DS2-VASc Score = 3  The patient's score is based upon: CHF History: 0 HTN History: 1 Diabetes History: 0 Stroke History: 0 Vascular Disease History: 1 Age Score: 1 Gender Score: 0        ASSESSMENT AND PLAN: 1. Persistent Atrial Fibrillation (ICD10:  I48.19) The patient's CHA2DS2-VASc score is 3, indicating a 3.2% annual risk of stroke.   S/p afib ablation 10/06/22 Patient back in rate controlled afib. We discussed that it is not unusual for patients to have recurrence of afib within the first 3 months post ablation. We discussed rhythm control options. Will plan for DCCV.  Continue Eliquis 5 mg BID with no missed doses for 3 months post ablation.  Continue Toprol 25 mg daily  2. Secondary Hypercoagulable State (ICD10:  D68.69) The patient is at significant risk for stroke/thromboembolism based upon his CHA2DS2-VASc Score of 3.  Continue Apixaban (Eliquis).   3. CAD S/p multiple DES No anginal symptoms.  Patient has visit to establish care with Dr Anne Fu.   4. HTN Stable, no changes today.    Follow up with Dr Anne Fu and Dr Nelly Laurence as scheduled.    [name] PA-C [facility] [address] [place], [place] [address] [phone] 11/03/2022 11:34 AM

## 2022-11-11 NOTE — Progress Notes (Signed)
Spoke with Patient , Procedure scheduled for 1230     , Please arrive at the hospital at    1130 , NPO after midnight on Tuesday, May take meds with sips of water in the AM, please have transportation for home post procedure, and someone to stay with pt for approximately 24 hours after

## 2022-11-12 ENCOUNTER — Other Ambulatory Visit: Payer: Self-pay

## 2022-11-12 ENCOUNTER — Encounter (HOSPITAL_COMMUNITY): Payer: Self-pay | Admitting: Cardiovascular Disease

## 2022-11-12 ENCOUNTER — Encounter (HOSPITAL_COMMUNITY)
Admission: RE | Disposition: A | Discharge status: Home / Self Care | Payer: Self-pay | Source: Ambulatory Visit | Attending: Cardiovascular Disease

## 2022-11-12 ENCOUNTER — Ambulatory Visit (HOSPITAL_BASED_OUTPATIENT_CLINIC_OR_DEPARTMENT_OTHER): Payer: Medicare HMO | Admitting: Anesthesiology

## 2022-11-12 ENCOUNTER — Ambulatory Visit (HOSPITAL_COMMUNITY): Payer: Medicare HMO | Admitting: Anesthesiology

## 2022-11-12 ENCOUNTER — Ambulatory Visit (HOSPITAL_COMMUNITY)
Admission: RE | Admit: 2022-11-12 | Discharge: 2022-11-12 | Disposition: A | Discharge status: Home / Self Care | Payer: Medicare HMO | Source: Ambulatory Visit | Attending: Cardiovascular Disease | Admitting: Cardiovascular Disease

## 2022-11-12 DIAGNOSIS — I4891 Unspecified atrial fibrillation: Secondary | ICD-10-CM

## 2022-11-12 DIAGNOSIS — D6869 Other thrombophilia: Secondary | ICD-10-CM | POA: Insufficient documentation

## 2022-11-12 DIAGNOSIS — I10 Essential (primary) hypertension: Secondary | ICD-10-CM | POA: Insufficient documentation

## 2022-11-12 DIAGNOSIS — Z87891 Personal history of nicotine dependence: Secondary | ICD-10-CM

## 2022-11-12 DIAGNOSIS — Z7901 Long term (current) use of anticoagulants: Secondary | ICD-10-CM | POA: Insufficient documentation

## 2022-11-12 DIAGNOSIS — J449 Chronic obstructive pulmonary disease, unspecified: Secondary | ICD-10-CM | POA: Diagnosis not present

## 2022-11-12 DIAGNOSIS — Z79899 Other long term (current) drug therapy: Secondary | ICD-10-CM | POA: Insufficient documentation

## 2022-11-12 DIAGNOSIS — I4819 Other persistent atrial fibrillation: Secondary | ICD-10-CM

## 2022-11-12 DIAGNOSIS — I252 Old myocardial infarction: Secondary | ICD-10-CM | POA: Diagnosis not present

## 2022-11-12 DIAGNOSIS — I2510 Atherosclerotic heart disease of native coronary artery without angina pectoris: Secondary | ICD-10-CM

## 2022-11-12 HISTORY — PX: CARDIOVERSION: SHX1299

## 2022-11-12 LAB — GLUCOSE, CAPILLARY: Glucose-Capillary: 196 mg/dL — ABNORMAL HIGH (ref 70–99)

## 2022-11-12 SURGERY — CARDIOVERSION
Anesthesia: General

## 2022-11-12 MED ORDER — LIDOCAINE 2% (20 MG/ML) 5 ML SYRINGE
INTRAMUSCULAR | Status: DC | PRN
  Administered 2022-11-12: 60 mg via INTRAVENOUS

## 2022-11-12 MED ORDER — SODIUM CHLORIDE 0.9 % IV SOLN: INTRAVENOUS | Status: DC | PRN

## 2022-11-12 MED ORDER — SODIUM CHLORIDE 0.9 % IV SOLN: INTRAVENOUS | Status: DC

## 2022-11-12 MED ORDER — PROPOFOL 10 MG/ML IV BOLUS
INTRAVENOUS | Status: DC | PRN
  Administered 2022-11-12: 60 mg via INTRAVENOUS

## 2022-11-12 SURGICAL SUPPLY — 1 items: ELECT DEFIB PAD ADLT CADENCE (PAD) ×1 IMPLANT

## 2022-11-12 NOTE — Transfer of Care (Signed)
Immediate Anesthesia Transfer of Care Note  Patient: Samuel Watkins  Procedure(s) Performed: CARDIOVERSION  Patient Location: Cath Lab  Anesthesia Type:General  Level of Consciousness: awake, alert , and oriented  Airway & Oxygen Therapy: Patient Spontanous Breathing  Post-op Assessment: Report given to RN and Post -op Vital signs reviewed and stable  Post vital signs: Reviewed and stable  Last Vitals:  Vitals Value Taken Time  BP 126/79 11/12/22 1210  Temp    Pulse 85 11/12/22 1214  Resp 21 11/12/22 1214  SpO2 91 % 11/12/22 1214  Vitals shown include unvalidated device data.  Last Pain:  Vitals:   11/12/22 1143  TempSrc: Temporal  PainSc: 0-No pain         Complications: No notable events documented.

## 2022-11-12 NOTE — Anesthesia Procedure Notes (Signed)
Procedure Name: General with mask airway Date/Time: 11/12/2022 12:17 PM  Performed by: Marena Chancy, CRNAPre-anesthesia Checklist: Timeout performed, Patient being monitored, Suction available, Emergency Drugs available and Patient identified Patient Re-evaluated:Patient Re-evaluated prior to induction Oxygen Delivery Method: Ambu bag Preoxygenation: Pre-oxygenation with 100% oxygen Induction Type: IV induction

## 2022-11-12 NOTE — Anesthesia Preprocedure Evaluation (Addendum)
Anesthesia Evaluation  Patient identified by MRN, date of birth, ID band Patient awake    Reviewed: Allergy & Precautions, NPO status , Patient's Chart, lab work & pertinent test results  Airway Mallampati: III  TM Distance: <3 FB Neck ROM: Full    Dental  (+) Dental Advisory Given, Edentulous Lower, Edentulous Upper   Pulmonary sleep apnea and Continuous Positive Airway Pressure Ventilation , COPD,  COPD inhaler, former smoker   Pulmonary exam normal breath sounds clear to auscultation       Cardiovascular hypertension, Pt. on medications and Pt. on home beta blockers + CAD and + Past MI   Rhythm:Irregular Rate:Abnormal     Neuro/Psych  Neuromuscular disease  negative psych ROS   GI/Hepatic negative GI ROS,,,(+) Hepatitis -, B  Endo/Other  diabetes, Type 2, Oral Hypoglycemic Agents    Renal/GU Renal InsufficiencyRenal disease     Musculoskeletal negative musculoskeletal ROS (+)    Abdominal   Peds  Hematology  (+) Blood dyscrasia (Eliquis)   Anesthesia Other Findings Day of surgery medications reviewed with the patient.  Reproductive/Obstetrics                             Anesthesia Physical Anesthesia Plan  ASA: 3  Anesthesia Plan: General   Post-op Pain Management: Minimal or no pain anticipated   Induction: Intravenous  PONV Risk Score and Plan: 2 and TIVA  Airway Management Planned: Mask  Additional Equipment:   Intra-op Plan:   Post-operative Plan:   Informed Consent: I have reviewed the patients History and Physical, chart, labs and discussed the procedure including the risks, benefits and alternatives for the proposed anesthesia with the patient or authorized representative who has indicated his/her understanding and acceptance.     Dental advisory given  Plan Discussed with: CRNA  Anesthesia Plan Comments:         Anesthesia Quick Evaluation

## 2022-11-12 NOTE — Interval H&P Note (Signed)
History and Physical Interval Note:  11/12/2022 10:58 AM  Samuel Watkins  has presented today for surgery, with the diagnosis of AFIB.  The various methods of treatment have been discussed with the patient and family. After consideration of risks, benefits and other options for treatment, the patient has consented to  Procedure(s): CARDIOVERSION (N/A) as a surgical intervention.  The patient's history has been reviewed, patient examined, no change in status, stable for surgery.  I have reviewed the patient's chart and labs.  Questions were answered to the patient's satisfaction.     [name]

## 2022-11-12 NOTE — Anesthesia Postprocedure Evaluation (Signed)
Anesthesia Post Note  Patient: Samuel Watkins  Procedure(s) Performed: CARDIOVERSION     Patient location during evaluation: Cath Lab Anesthesia Type: General Level of consciousness: awake and alert Pain management: pain level controlled Vital Signs Assessment: post-procedure vital signs reviewed and stable Respiratory status: spontaneous breathing, nonlabored ventilation, respiratory function stable and patient connected to nasal cannula oxygen Cardiovascular status: blood pressure returned to baseline and stable Postop Assessment: no apparent nausea or vomiting Anesthetic complications: no   No notable events documented.  Last Vitals:  Vitals:   11/12/22 1300 11/12/22 1301  BP: 112/70 112/70  Pulse: (!) 55 (!) 56  Resp: (!) 0 (!) 7  Temp:    SpO2: 96% 92%    Last Pain:  Vitals:   11/12/22 1230  TempSrc:   PainSc: 0-No pain                 Collene Schlichter

## 2022-11-12 NOTE — Op Note (Signed)
Procedure: Electrical Cardioversion Indications:  Atrial Fibrillation  Procedure Details:  Consent: Risks of procedure as well as the alternatives and risks of each were explained to the (patient/caregiver).  Consent for procedure obtained.  Time Out: Verified patient identification, verified procedure, site/side was marked, verified correct patient position, special equipment/implants available, medications/allergies/relevent history reviewed, required imaging and test results available.  2  Patient placed on cardiac monitor, pulse oximetry, supplemental oxygen as necessary.  Sedation given:  propofol IV, Dr. Desmond Lope Pacer pads placed anterior and posterior chest.  Cardioverted 1 time(s).  Cardioversion with synchronized biphasic 150J shock.  Evaluation: Findings: Post procedure EKG shows: NSR Complications: None Patient did tolerate procedure well.  Time Spent Directly with the Patient:  30 minutes   Samuel Watkins 11/12/2022, 12:22 PM

## 2022-11-13 ENCOUNTER — Encounter (HOSPITAL_COMMUNITY): Payer: Self-pay | Admitting: Cardiovascular Disease

## 2022-11-24 LAB — LAB REPORT - SCANNED
A1c: 8.7
Creatinine, POC: 44.9 mg/dL
Microalb Creat Ratio: 6.7
Microalbumin, Urine: 0.3

## 2022-12-02 ENCOUNTER — Ambulatory Visit: Payer: Medicare HMO | Attending: Cardiology | Admitting: Cardiology

## 2022-12-02 ENCOUNTER — Encounter: Payer: Self-pay | Admitting: Cardiology

## 2022-12-02 VITALS — BP 94/60 | HR 37 | Ht 73.0 in | Wt 226.4 lb

## 2022-12-02 DIAGNOSIS — I4819 Other persistent atrial fibrillation: Secondary | ICD-10-CM | POA: Diagnosis not present

## 2022-12-02 DIAGNOSIS — I453 Trifascicular block: Secondary | ICD-10-CM | POA: Diagnosis not present

## 2022-12-02 DIAGNOSIS — E785 Hyperlipidemia, unspecified: Secondary | ICD-10-CM | POA: Diagnosis not present

## 2022-12-02 NOTE — Progress Notes (Signed)
Cardiology Office Note:    Date:  12/02/2022   ID:  [name], DOB [birth year], MRN [id number]  PCP:  Daisy Floro, MD   [facility] Providers Cardiologist:  Donato Schultz, MD Electrophysiologist:  Maurice Small, MD     Referring MD: Daisy Floro, MD    History of Present Illness:    Samuel Watkins is a 77 y.o. male former patient of Dr. Corky Sing with persistent atrial fibrillation status post cardioversion on 11/12/2022.  He has had ablation with Dr. Nelly Laurence on 10/06/2022.  He is on Eliquis.  Previous cardioversion in August 2023 with early recurrence of AF.  Symptomatic with this.  Shortness of breath.  Has been seen by atrial fibrillation clinic.  CAD (BMS to RCA and DES to RCA 2001 and 2008, LAD DES 2008), PAD, DM2, COPD, hypertension, hyperlipidemia, AAA s/p repair   Has had 3 cardioversion.  Past Medical History:  Diagnosis Date   CAD (coronary artery disease)    CKD (chronic kidney disease)    COPD (chronic obstructive pulmonary disease) (HCC)    Diabetes mellitus without complication (HCC)    Hyperlipemia    Hypertension    Myocardial infarction (HCC)    OSA on CPAP    Peripheral neuropathy     Past Surgical History:  Procedure Laterality Date   ABDOMINAL AORTIC ANEURYSM REPAIR     ATRIAL FIBRILLATION ABLATION N/A 10/06/2022   Procedure: ATRIAL FIBRILLATION ABLATION;  Surgeon: Maurice Small, MD;  Location: [facility] INVASIVE CV LAB;  Service: Cardiovascular;  Laterality: N/A;   CARDIOVERSION N/A 02/14/2022   Procedure: CARDIOVERSION;  Surgeon: Lewayne Bunting, MD;  Location: [facility] ENDOSCOPY;  Service: Cardiovascular;  Laterality: N/A;   CARDIOVERSION N/A 11/12/2022   Procedure: CARDIOVERSION;  Surgeon: Thurmon Fair, MD;  Location: [facility] INVASIVE CV LAB;  Service: Cardiovascular;  Laterality: N/A;   LUMBAR DISC SURGERY  1997   LUNG SURGERY     found spot on lung 10 years ago    Current Medications: Current Meds  Medication Sig    acetaminophen (TYLENOL) 500 MG tablet Take 1,000 mg by mouth every 6 (six) hours as needed for moderate pain.   albuterol (PROVENTIL HFA;VENTOLIN HFA) 108 (90 BASE) MCG/ACT inhaler Inhale 2 puffs into the lungs 4 (four) times daily as needed for wheezing or shortness of breath.   amLODipine (NORVASC) 10 MG tablet Take 1 tablet (10 mg total) by mouth daily.   apixaban (ELIQUIS) 5 MG TABS tablet Take 1 tablet (5 mg total) by mouth 2 (two) times daily.   atorvastatin (LIPITOR) 80 MG tablet Take 80 mg by mouth at bedtime.   Cholecalciferol (VITAMIN D3) 5000 units CAPS Take 5,000 Units by mouth daily.   empagliflozin (JARDIANCE) 25 MG TABS tablet Take 25 mg by mouth daily.   gabapentin (NEURONTIN) 300 MG capsule Take 300 mg by mouth at bedtime.   glimepiride (AMARYL) 2 MG tablet Take 2 mg by mouth daily with breakfast.   glucose blood (ACCU-CHEK SMARTVIEW) test strip See admin instructions.   losartan-hydrochlorothiazide (HYZAAR) 50-12.5 MG tablet Take 1 tablet by mouth daily.   MAGNESIUM PO Take 2 tablets by mouth See admin instructions. Take 2 tablets daily except skip dose on Sundays   metFORMIN (GLUCOPHAGE-XR) 500 MG 24 hr tablet Take 500 mg by mouth 2 (two) times daily.   nitroGLYCERIN (NITROSTAT) 0.4 MG SL tablet Place 0.4 mg under the tongue every 5 (five) minutes x 3 doses as needed for chest pain (  Call 911 at 3rd dose within 15 minutes.).   pantoprazole (PROTONIX) 40 MG tablet TAKE ONE TABLET BY MOUTH DAILY (TAKE ON AN EMPTY STOMACH 30 MINUTES PRIOR TO A MEAL)   pioglitazone (ACTOS) 30 MG tablet Take 15 mg by mouth daily.   Tiotropium Bromide-Olodaterol (STIOLTO RESPIMAT) 2.5-2.5 MCG/ACT AERS Inhale 2 each into the lungs daily.   [DISCONTINUED] metoprolol succinate (TOPROL-XL) 50 MG 24 hr tablet Take 25 mg by mouth daily.     Allergies:   Patient has no known allergies.   Social History   Socioeconomic History   Marital status: Married    Spouse name: Meriam Sprague   Number of children: 3    Years of education: 12   Highest education level: Not on file  Occupational History   Occupation: retired  Tobacco Use   Smoking status: Former    Packs/day: 1.00    Years: 50.00    Additional pack years: 0.00    Total pack years: 50.00    Types: Cigarettes    Quit date: 07/14/2010    Years since quitting: 12.3   Smokeless tobacco: Never   Tobacco comments:    Former smoker 11/03/22  Vaping Use   Vaping Use: Never used  Substance and Sexual Activity   Alcohol use: No    Alcohol/week: 0.0 standard drinks of alcohol   Drug use: No   Sexual activity: Not on file  Other Topics Concern   Not on file  Social History Narrative   Lives with wife   Caffeine- coffee 2 c, soda 1, tea 1   Social Determinants of Health   Financial Resource Strain: Not on file  Food Insecurity: Not on file  Transportation Needs: Not on file  Physical Activity: Not on file  Stress: Not on file  Social Connections: Not on file     Family History: The patient's family history includes Heart disease in his father and mother; Throat cancer in his brother.  ROS:   Please see the history of present illness.     All other systems reviewed and are negative.  EKGs/Labs/Other Studies Reviewed:    The following studies were reviewed today: Cardiac Studies & Procedures       ECHOCARDIOGRAM  ECHOCARDIOGRAM COMPLETE 09/23/2021  Narrative ECHOCARDIOGRAM REPORT    Patient Name:   Samuel Watkins Date of Exam: 09/23/2021 Medical Rec #:  [id number]      Height:       73.0 in Accession #:    [id number]     Weight:       235.0 lb Date of Birth:  1946/04/14      BSA:          2.304 m Patient Age:    76 years       BP:           118/68 mmHg Patient Gender: M              HR:           62 bpm. Exam Location:  [address]  Procedure: 2D Echo, 3D Echo, Cardiac Doppler and Color Doppler  Indications:    R06.00 Dyspnea  History:        Patient has no prior history of Echocardiogram examinations. CAD and  Previous Myocardial Infarction, COPD, Signs/Symptoms:Dyspnea; Risk Factors:Former Smoker, Sleep Apnea, Family History of Coronary Artery Disease, Hypertension, Diabetes and Dyslipidemia. Hepatitis B, Chronic Kidney Disease, Abdominal Aortic Aneurysm status post Repair.  Sonographer:    Farrel Conners  RDCS Referring Phys: Barry Dienes [facility]  IMPRESSIONS   1. Left ventricular ejection fraction, by estimation, is 60 to 65%. Left ventricular ejection fraction by 3D volume is 61 %. The left ventricle has normal function. The left ventricle has no regional wall motion abnormalities. Left ventricular diastolic parameters are consistent with Grade I diastolic dysfunction (impaired relaxation). 2. Right ventricular systolic function is normal. The right ventricular size is normal. 3. Left atrial size was mildly dilated. 4. The mitral valve is grossly normal. Trivial mitral valve regurgitation. 5. The aortic valve is tricuspid. Aortic valve regurgitation is not visualized. 6. Aortic dilatation noted. There is mild dilatation of the ascending aorta, measuring 40 mm. 7. The inferior vena cava is normal in size with greater than 50% respiratory variability, suggesting right atrial pressure of 3 mmHg.  Comparison(s): No prior Echocardiogram.  FINDINGS Left Ventricle: Left ventricular ejection fraction, by estimation, is 60 to 65%. Left ventricular ejection fraction by 3D volume is 61 %. The left ventricle has normal function. The left ventricle has no regional wall motion abnormalities. The left ventricular internal cavity size was normal in size. There is no left ventricular hypertrophy. Left ventricular diastolic parameters are consistent with Grade I diastolic dysfunction (impaired relaxation). Indeterminate filling pressures.  Right Ventricle: The right ventricular size is normal. No increase in right ventricular wall thickness. Right ventricular systolic function is normal.  Left Atrium: Left atrial  size was mildly dilated.  Right Atrium: Right atrial size was normal in size.  Pericardium: There is no evidence of pericardial effusion.  Mitral Valve: The mitral valve is grossly normal. Trivial mitral valve regurgitation.  Tricuspid Valve: The tricuspid valve is grossly normal. Tricuspid valve regurgitation is trivial.  Aortic Valve: The aortic valve is tricuspid. Aortic valve regurgitation is not visualized.  Pulmonic Valve: The pulmonic valve was normal in structure. Pulmonic valve regurgitation is not visualized.  Aorta: Aortic dilatation noted. There is mild dilatation of the ascending aorta, measuring 40 mm.  Venous: The inferior vena cava is normal in size with greater than 50% respiratory variability, suggesting right atrial pressure of 3 mmHg.  IAS/Shunts: No atrial level shunt detected by color flow Doppler.   LEFT VENTRICLE PLAX 2D LVIDd:         5.30 cm         Diastology LVIDs:         2.85 cm         LV e' medial:    8.33 cm/s LV PW:         1.10 cm         LV E/e' medial:  9.9 LV IVS:        0.85 cm         LV e' lateral:   10.80 cm/s LVOT diam:     2.70 cm         LV E/e' lateral: 7.6 LV SV:         123 LV SV Index:   53 LVOT Area:     5.73 cm        3D Volume EF LV 3D EF:    Left ventricul ar ejection fraction by 3D volume is 61 %.  3D Volume EF: 3D EF:        61 % LV EDV:       110 ml LV ESV:       43 ml LV SV:        67 ml  RIGHT VENTRICLE RV S  prime:     18.00 cm/s TAPSE (M-mode): 2.5 cm  LEFT ATRIUM             Index        RIGHT ATRIUM           Index LA diam:        5.05 cm 2.19 cm/m   RA Area:     23.20 cm LA Vol (A2C):   86.7 ml 37.63 ml/m  RA Volume:   67.90 ml  29.47 ml/m LA Vol (A4C):   79.4 ml 34.46 ml/m LA Biplane Vol: 84.8 ml 36.80 ml/m AORTIC VALVE LVOT Vmax:   125.67 cm/s LVOT Vmean:  75.933 cm/s LVOT VTI:    0.215 m  AORTA Ao Root diam: 3.30 cm Ao Asc diam:  3.60 cm  MITRAL VALVE MV Area (PHT): cm          SHUNTS MV Decel Time: 242 msec    Systemic VTI:  0.21 m MV E velocity: 82.50 cm/s  Systemic Diam: 2.70 cm MV A velocity: 98.00 cm/s MV E/A ratio:  0.84  Zoila Shutter MD Electronically signed by Zoila Shutter MD Signature Date/Time: 09/23/2021/4:57:10 PM    Final              EKG: 12/02/2022-junctional 67 rare P waves noted at beginning of tracing, PVCs  Recent Labs: 12/24/2021: ALT 23; Magnesium 2.0; TSH 1.900 11/03/2022: BUN 20; Creatinine, Ser 1.54; Hemoglobin 16.2; Platelets 159; Potassium 4.1; Sodium 135  Recent Lipid Panel    Component Value Date/Time   CHOL 93 (L) 06/17/2019 1024   TRIG 79 06/17/2019 1024   HDL 30 (L) 06/17/2019 1024   CHOLHDL 3.1 06/17/2019 1024   CHOLHDL 10.5 07/10/2007 0345   VLDL 21 07/10/2007 0345   LDLCALC 47 06/17/2019 1024     Risk Assessment/Calculations:               Physical Exam:    VS:  BP 94/60   Pulse (!) 37   Ht 6\' 1"  (1.854 m)   Wt 226 lb 6.4 oz (102.7 kg)   SpO2 95%   BMI 29.87 kg/m     Wt Readings from Last 3 Encounters:  12/02/22 226 lb 6.4 oz (102.7 kg)  11/12/22 228 lb (103.4 kg)  11/03/22 228 lb 9.6 oz (103.7 kg)     GEN:  Well nourished, well developed in no acute distress HEENT: Normal NECK: No JVD; No carotid bruits LYMPHATICS: No lymphadenopathy CARDIAC: ectopy RRR, no murmurs, rubs, gallops RESPIRATORY:  Clear to auscultation without rales, wheezing or rhonchi  ABDOMEN: Soft, non-tender, non-distended MUSCULOSKELETAL:  No edema; No deformity  SKIN: Warm and dry NEUROLOGIC:  Alert and oriented x 3 PSYCHIATRIC:  Normal affect   ASSESSMENT:    1. Persistent atrial fibrillation (HCC)   2. Hyperlipidemia LDL goal <70   3. Trifascicular block    PLAN:    In order of problems listed above:  Atrial fibrillation persistent with ablation early recurrence - Today appears to be in junctional rhythm with PVCs.  Rare isolated P waves noted at the beginning of tracing.  Does not appear to be A-fib. -I  will go ahead and stop his Toprol 25 mg.  He is very sensitive to his arrhythmia.  No need for cardioversion.  He has had 3 total cardioversions as well as ablation.  Frustrated.  Continue to exercise, diet.  Has COPD but he states that this is manageable. -Continue with Eliquis.  Stroke prophylaxis.  Coronary artery disease - Multiple PCI as above.  Goal-directed medical therapy.  Previously [name] discontinued his niacin.  If triglycerides became an issue encouraged Vascepa.  Last check triglycerides 79  Last triglycerides at [facility] 227.  LDL 63.  Right bundle branch block             Medication Adjustments/Labs and Tests Ordered: Current medicines are reviewed at length with the patient today.  Concerns regarding medicines are outlined above.  No orders of the defined types were placed in this encounter.  No orders of the defined types were placed in this encounter.   Patient Instructions  Medication Instructions:  Please discontinue your Metoprolol. Continue all other medications as listed.  *If you need a refill on your cardiac medications before your next appointment, please call your pharmacy*  Follow-Up: At [facility], you and your health needs are our priority.  As part of our continuing mission to provide you with exceptional heart care, we have created designated Provider Care Teams.  These Care Teams include your primary Cardiologist (physician) and Advanced Practice Providers (APPs -  Physician Assistants and Nurse Practitioners) who all work together to provide you with the care you need, when you need it.  We recommend signing up for the patient portal called "MyChart".  Sign up information is provided on this After Visit Summary.  MyChart is used to connect with patients for Virtual Visits (Telemedicine).  Patients are able to view lab/test results, encounter notes, upcoming appointments, etc.  Non-urgent messages can be sent to your provider as well.    To learn more about what you can do with MyChart, go to [email or web address].    Your next appointment:   6 month(s)  Provider:   Then Jari Favre, PA-C, Robin Searing, NP, Jacolyn Reedy, PA-C, Eligha Bridegroom, NP, or Tereso Newcomer, PA-C     Then, Donato Schultz, MD will plan to see you again in 1 year(s).       Signed, Donato Schultz, MD  12/02/2022 11:01 AM    [facility]

## 2022-12-02 NOTE — Patient Instructions (Signed)
Medication Instructions:  Please discontinue your Metoprolol. Continue all other medications as listed.  *If you need a refill on your cardiac medications before your next appointment, please call your pharmacy*  Follow-Up: At [facility], you and your health needs are our priority.  As part of our continuing mission to provide you with exceptional heart care, we have created designated Provider Care Teams.  These Care Teams include your primary Cardiologist (physician) and Advanced Practice Providers (APPs -  Physician Assistants and Nurse Practitioners) who all work together to provide you with the care you need, when you need it.  We recommend signing up for the patient portal called "MyChart".  Sign up information is provided on this After Visit Summary.  MyChart is used to connect with patients for Virtual Visits (Telemedicine).  Patients are able to view lab/test results, encounter notes, upcoming appointments, etc.  Non-urgent messages can be sent to your provider as well.   To learn more about what you can do with MyChart, go to [email or web address].    Your next appointment:   6 month(s)  Provider:   Then Jari Favre, PA-C, Robin Searing, NP, Jacolyn Reedy, PA-C, Eligha Bridegroom, NP, or Tereso Newcomer, PA-C     Then, Donato Schultz, MD will plan to see you again in 1 year(s).

## 2022-12-04 NOTE — Addendum Note (Signed)
Addended by: Tommi Rumps on: 12/04/2022 07:29 AM   Modules accepted: Orders

## 2022-12-22 DIAGNOSIS — E1165 Type 2 diabetes mellitus with hyperglycemia: Secondary | ICD-10-CM | POA: Diagnosis not present

## 2022-12-22 DIAGNOSIS — E559 Vitamin D deficiency, unspecified: Secondary | ICD-10-CM | POA: Diagnosis not present

## 2022-12-22 DIAGNOSIS — I480 Paroxysmal atrial fibrillation: Secondary | ICD-10-CM | POA: Diagnosis not present

## 2022-12-22 DIAGNOSIS — E782 Mixed hyperlipidemia: Secondary | ICD-10-CM | POA: Diagnosis not present

## 2022-12-22 DIAGNOSIS — I10 Essential (primary) hypertension: Secondary | ICD-10-CM | POA: Diagnosis not present

## 2022-12-22 DIAGNOSIS — J439 Emphysema, unspecified: Secondary | ICD-10-CM | POA: Diagnosis not present

## 2022-12-22 DIAGNOSIS — E1140 Type 2 diabetes mellitus with diabetic neuropathy, unspecified: Secondary | ICD-10-CM | POA: Diagnosis not present

## 2022-12-22 DIAGNOSIS — I2510 Atherosclerotic heart disease of native coronary artery without angina pectoris: Secondary | ICD-10-CM | POA: Diagnosis not present

## 2023-01-08 ENCOUNTER — Encounter: Payer: Self-pay | Admitting: Cardiovascular Disease

## 2023-01-08 ENCOUNTER — Ambulatory Visit: Payer: Medicare HMO | Attending: Cardiovascular Disease | Admitting: Cardiovascular Disease

## 2023-01-08 VITALS — BP 114/68 | HR 105 | Ht 73.0 in | Wt 224.6 lb

## 2023-01-08 DIAGNOSIS — I4819 Other persistent atrial fibrillation: Secondary | ICD-10-CM | POA: Diagnosis not present

## 2023-01-08 MED ORDER — AMIODARONE HCL 200 MG PO TABS: ORAL_TABLET | ORAL | 3 refills | Status: DC

## 2023-01-08 NOTE — Patient Instructions (Signed)
Medication Instructions:  START Amiodarone 200 mg twice daily for 14 days, then decrease to 200 mg once daily  *If you need a refill on your cardiac medications before your next appointment, please call your pharmacy*   Follow-Up: At [facility], you and your health needs are our priority.  As part of our continuing mission to provide you with exceptional heart care, we have created designated Provider Care Teams.  These Care Teams include your primary Cardiologist (physician) and Advanced Practice Providers (APPs -  Physician Assistants and Nurse Practitioners) who all work together to provide you with the care you need, when you need it.  We recommend signing up for the patient portal called "MyChart".  Sign up information is provided on this After Visit Summary.  MyChart is used to connect with patients for Virtual Visits (Telemedicine).  Patients are able to view lab/test results, encounter notes, upcoming appointments, etc.  Non-urgent messages can be sent to your provider as well.   To learn more about what you can do with MyChart, go to [email or web address].    Your next appointment:   2 week(s) with AF Clinic 6 months with Dr Nelly Laurence  Provider:   York Pellant, MD

## 2023-01-08 NOTE — Progress Notes (Signed)
Electrophysiology Office Note:    Date:  01/08/2023   ID:  Samuel Watkins, DOB 1945/12/12, MRN [id number]  PCP:  Daisy Floro, MD    [facility] Providers Cardiologist:  Donato Schultz, MD Electrophysiologist:  Maurice Small, MD     Referring MD: Daisy Floro, MD   History of Present Illness:    Samuel Watkins is a 77 y.o. male with a hx listed below, significant for CAD, multiple DES, PAD, DM 2, hypertension, hyperlipidemia, AAA s/p repair, atrial fibrillation, referred for arrhythmia management.  He underwent DC cardioversion in August 2023 and had early recurrence of AF.  He reports that he is very symptomatic with atrial fibrillation.  He is easily fatigued and winded.  He not enjoy doing the things he joined doing in the past due to to fatigue and shortness of breath.  He does not have chest pain, syncope, presyncope.  He underwent A-fib ablation on October 06, 2022.  He had recurrence of atrial fibrillation shortly after the procedure and, when seen in atrial fibrillation clinic on April 22, reported that he did not feel much better. He underwent cardioversion on Nov 12, 2022.   Past Medical History:  Diagnosis Date   CAD (coronary artery disease)    CKD (chronic kidney disease)    COPD (chronic obstructive pulmonary disease) (HCC)    Diabetes mellitus without complication (HCC)    Hyperlipemia    Hypertension    Myocardial infarction (HCC)    OSA on CPAP    Peripheral neuropathy     Past Surgical History:  Procedure Laterality Date   ABDOMINAL AORTIC ANEURYSM REPAIR     ATRIAL FIBRILLATION ABLATION N/A 10/06/2022   Procedure: ATRIAL FIBRILLATION ABLATION;  Surgeon: Maurice Small, MD;  Location: [facility];  Service: Cardiovascular;  Laterality: N/A;   CARDIOVERSION N/A 02/14/2022   Procedure: CARDIOVERSION;  Surgeon: Lewayne Bunting, MD;  Location: [facility] ENDOSCOPY;  Service: Cardiovascular;  Laterality: N/A;   CARDIOVERSION N/A 11/12/2022    Procedure: CARDIOVERSION;  Surgeon: Thurmon Fair, MD;  Location: [facility] IN[facility]AS[facility] CV LAB;  Service: Cardiovascular;  Laterality: N/A;   LUMBAR DISC SURGERY  1997   LUNG SURGERY     found spot on lung 10 years ago    Current Medications: Current Meds  Medication Sig   acetaminophen (TYLENOL) 500 MG tablet Take 1,000 mg by mouth every 6 (six) hours as needed for moderate pain.   albuterol (PROVENTIL HFA;VENTOLIN HFA) 108 (90 BASE) MCG/ACT inhaler Inhale 2 puffs into the lungs 4 (four) times daily as needed for wheezing or shortness of breath.   amLODipine (NORVASC) 10 MG tablet Take 1 tablet (10 mg total) by mouth daily.   apixaban (ELIQUIS) 5 MG TABS tablet Take 1 tablet (5 mg total) by mouth 2 (two) times daily.   atorvastatin (LIPITOR) 80 MG tablet Take 80 mg by mouth at bedtime.   Cholecalciferol (VITAMIN D3) 5000 units CAPS Take 5,000 Units by mouth daily.   empagliflozin (JARDIANCE) 25 MG TABS tablet Take 25 mg by mouth daily.   gabapentin (NEURONTIN) 300 MG capsule Take 300 mg by mouth at bedtime.   glimepiride (AMARYL) 2 MG tablet Take 2 mg by mouth daily with breakfast.   glucose blood (ACCU-CHEK SMARTVIEW) test strip See admin instructions.   losartan-hydrochlorothiazide (HYZAAR) 50-12.5 MG tablet Take 1 tablet by mouth daily.   MAGNESIUM PO Take 2 tablets by mouth See admin instructions. Take 2 tablets daily except skip dose on  Sundays   metFORMIN (GLUCOPHAGE-XR) 500 MG 24 hr tablet Take 500 mg by mouth 2 (two) times daily.   nitroGLYCERIN (NITROSTAT) 0.4 MG SL tablet Place 0.4 mg under the tongue every 5 (five) minutes x 3 doses as needed for chest pain (Call 911 at 3rd dose within 15 minutes.).   pantoprazole (PROTONIX) 40 MG tablet TAKE ONE TABLET BY MOUTH DAILY (TAKE ON AN EMPTY STOMACH 30 MINUTES PRIOR TO A MEAL)   pioglitazone (ACTOS) 30 MG tablet Take 15 mg by mouth daily.   Semaglutide,0.25 or 0.5MG /DOS, 2 MG/3ML SOPN    Tiotropium Bromide-Olodaterol (STIOLTO RESPIMAT)  2.5-2.5 MCG/ACT AERS Inhale 2 each into the lungs daily.     Allergies:   Patient has no known allergies.   Social History   Socioeconomic History   Marital status: Married    Spouse name: Meriam Sprague   Number of children: 3   Years of education: 12   Highest education level: Not on file  Occupational History   Occupation: retired  Tobacco Use   Smoking status: Former    Packs/day: 1.00    Years: 50.00    Additional pack years: 0.00    Total pack years: 50.00    Types: Cigarettes    Quit date: 07/14/2010    Years since quitting: 12.4   Smokeless tobacco: Never   Tobacco comments:    Former smoker 11/03/22  Vaping Use   Vaping Use: Never used  Substance and Sexual Activity   Alcohol use: No    Alcohol/week: 0.0 standard drinks of alcohol   Drug use: No   Sexual activity: Not on file  Other Topics Concern   Not on file  Social History Narrative   Lives with wife   Caffeine- coffee 2 c, soda 1, tea 1   Social Determinants of Health   Financial Resource Strain: Not on file  Food Insecurity: Not on file  Transportation Needs: Not on file  Physical Activity: Not on file  Stress: Not on file  Social Connections: Not on file     Family History: The patient's family history includes Heart disease in his father and mother; Throat cancer in his brother.  ROS:   Please see the history of present illness.    All other systems reviewed and are negative.  EKGs/Labs/Other Studies Reviewed Today:     TTE 09/2021: normal EF, LA mildly dilated  EKG:   EKG Interpretation Date/Time:  Thursday January 08 2023 11:44:50 EDT Ventricular Rate:  105 PR Interval:    QRS Duration:  162 QT Interval:  410 QTC Calculation: 541 R Axis:   143  Text Interpretation: Atrial fibrillation Right bundle branch block Septal infarct , age undetermined When compared with ECG of 12-Nov-2022 12:27, AF has replaced sinus rhythm Confirmed by York Pellant [phone]) on 01/08/2023 12:05:47 PM   Recent  Labs: 11/03/2022: BUN 20; Creatinine, Ser 1.54; Hemoglobin 16.2; Platelets 159; Potassium 4.1; Sodium 135     Physical Exam:    VS:  BP 114/68   Pulse (!) 105   Ht 6\' 1"  (1.854 m)   Wt 224 lb 9.6 oz (101.9 kg)   SpO2 95%   BMI 29.63 kg/m     Wt Readings from Last 3 Encounters:  01/08/23 224 lb 9.6 oz (101.9 kg)  12/02/22 226 lb 6.4 oz (102.7 kg)  11/12/22 228 lb (103.4 kg)     GEN: Well nourished, well developed in no acute distress CARDIAC: iRRR, no murmurs, rubs, gallops RESPIRATORY:  Normal  work of breathing MUSCULOSKELETAL: no edema    ASSESSMENT & PLAN:    Persistent AF:  symptomatic. Failed DC cardioversion.  S/p ablation 09/2022 with early recurrence Also now with PVCs, very symptomatic with fatigue Will start amiodarone for both PVCs and AF Will follow-up in 6 months; may decide to DC amiodarone and repeat ablation at that time.  Frequent PVCs Burden very high on ECG Will assess after amiodarone started  Secondary hypercoagulable state: continue apixaban   Hepatitis B  COPD        Medication Adjustments/Labs and Tests Ordered: Current medicines are reviewed at length with the patient today.  Concerns regarding medicines are outlined above.  Orders Placed This Encounter  Procedures   EKG 12-Lead   No orders of the defined types were placed in this encounter.    Signed, Maurice Small, MD  01/08/2023 12:04 PM    [facility]

## 2023-01-09 ENCOUNTER — Other Ambulatory Visit: Payer: Self-pay

## 2023-01-09 MED ORDER — AMIODARONE HCL 200 MG PO TABS: ORAL_TABLET | ORAL | 3 refills | Status: DC

## 2023-01-09 NOTE — Telephone Encounter (Signed)
Pt calling requesting his medication amiodarone be resent to a different pharmacy. Rx resent to the requested pharmacy. Confirmation received.

## 2023-01-21 ENCOUNTER — Telehealth: Payer: Self-pay | Admitting: Cardiovascular Disease

## 2023-01-21 NOTE — Telephone Encounter (Signed)
Spoke with patient about amiodarone concerns. Instructed to take with food to help with nausea side effect - patient states nausea doesn't occur every day but educated on it improving after loading dose is completed. No further questions at this time

## 2023-01-21 NOTE — Telephone Encounter (Signed)
Pt c/o medication issue:  1. Name of Medication: amiodarone (PACERONE) 200 MG tablet   2. How are you currently taking this medication (dosage and times per day)?    Take 1 tablet by mouth twice daily for 14 days, then 1 tablet once daily thereafter    3. Are you having a reaction (difficulty breathing--STAT)? no  4. What is your medication issue? Patient states he started taking this medication almost two weeks ago, and it's making him nauseous.

## 2023-01-26 ENCOUNTER — Ambulatory Visit (HOSPITAL_COMMUNITY)
Admission: RE | Admit: 2023-01-26 | Discharge: 2023-01-26 | Disposition: A | Discharge status: Home / Self Care | Payer: Medicare HMO | Source: Ambulatory Visit | Attending: Internal Medicine | Admitting: Internal Medicine

## 2023-01-26 VITALS — BP 116/68 | HR 65 | Ht 73.0 in | Wt 218.4 lb

## 2023-01-26 DIAGNOSIS — E785 Hyperlipidemia, unspecified: Secondary | ICD-10-CM | POA: Diagnosis not present

## 2023-01-26 DIAGNOSIS — I252 Old myocardial infarction: Secondary | ICD-10-CM | POA: Insufficient documentation

## 2023-01-26 DIAGNOSIS — D6869 Other thrombophilia: Secondary | ICD-10-CM | POA: Diagnosis not present

## 2023-01-26 DIAGNOSIS — I2510 Atherosclerotic heart disease of native coronary artery without angina pectoris: Secondary | ICD-10-CM | POA: Insufficient documentation

## 2023-01-26 DIAGNOSIS — N189 Chronic kidney disease, unspecified: Secondary | ICD-10-CM | POA: Insufficient documentation

## 2023-01-26 DIAGNOSIS — I129 Hypertensive chronic kidney disease with stage 1 through stage 4 chronic kidney disease, or unspecified chronic kidney disease: Secondary | ICD-10-CM | POA: Diagnosis not present

## 2023-01-26 DIAGNOSIS — Z7901 Long term (current) use of anticoagulants: Secondary | ICD-10-CM | POA: Diagnosis not present

## 2023-01-26 DIAGNOSIS — I4819 Other persistent atrial fibrillation: Secondary | ICD-10-CM | POA: Insufficient documentation

## 2023-01-26 DIAGNOSIS — Z79899 Other long term (current) drug therapy: Secondary | ICD-10-CM | POA: Insufficient documentation

## 2023-01-26 DIAGNOSIS — E1122 Type 2 diabetes mellitus with diabetic chronic kidney disease: Secondary | ICD-10-CM | POA: Diagnosis not present

## 2023-01-26 DIAGNOSIS — G4733 Obstructive sleep apnea (adult) (pediatric): Secondary | ICD-10-CM | POA: Diagnosis not present

## 2023-01-26 DIAGNOSIS — Z8249 Family history of ischemic heart disease and other diseases of the circulatory system: Secondary | ICD-10-CM | POA: Diagnosis not present

## 2023-01-26 DIAGNOSIS — Z7984 Long term (current) use of oral hypoglycemic drugs: Secondary | ICD-10-CM | POA: Diagnosis not present

## 2023-01-26 DIAGNOSIS — J449 Chronic obstructive pulmonary disease, unspecified: Secondary | ICD-10-CM | POA: Diagnosis not present

## 2023-01-26 DIAGNOSIS — Z5181 Encounter for therapeutic drug level monitoring: Secondary | ICD-10-CM | POA: Diagnosis not present

## 2023-01-26 MED ORDER — AMIODARONE HCL 200 MG PO TABS: 200.0000 mg | ORAL_TABLET | Freq: Every day | ORAL | 2 refills | Status: DC

## 2023-01-26 NOTE — Progress Notes (Signed)
Primary Care Physician: Daisy Floro, MD Primary Cardiologist: Dr Anne Fu (former Dr Katrinka Blazing pt) Primary Electrophysiologist: Dr Nelly Laurence Referring Physician: Dr Alger Memos is a 77 y.o. male with a history of CAD, PAD, DM, COPD, HTN, HLD, AAA s/p repair, atrial fibrillation who presents for follow up in the [facility]. He underwent DC cardioversion in August 2023 and had early recurrence of AF. He reports that he is very symptomatic with atrial fibrillation with fatigue and SOB with exertion. He underwent afib ablation with Dr Nelly Laurence on 10/06/22. Patient is on Eliquis for a CHADS2VASC score of 5.  On follow up today, patient reports that he has not felt much different post ablation. He is in rate controlled afib today with symptoms of fatigue, legs feel heavy. He denies chest pain, swallowing pain, or groin issues.   On follow up 01/26/23, he is currently in NSR. Taking amiodarone as of 6/27 OV with Dr. Nelly Laurence due to [facility] and PVCs. He feels less nauseous since transition to once daily over the weekend. He thought he was still in Afib due to feeling like having no energy. Dr. Morrie Sheldon plan is to reassess in 6 months for consideration of repeat ablation. No missed doses of Eliquis.  Today, he denies symptoms of palpitations, chest pain, shortness of breath, orthopnea, PND, lower extremity edema, dizziness, presyncope, syncope, snoring, daytime somnolence, bleeding, or neurologic sequela. The patient is tolerating medications without difficulties and is otherwise without complaint today.    Atrial Fibrillation Risk Factors:  he does not have symptoms or diagnosis of sleep apnea. he does not have a history of rheumatic fever.   he has a BMI of Body mass index is 28.81 kg/m.Marland Kitchen Filed Weights   01/26/23 0927  Weight: 99.1 kg     Family History  Problem Relation Age of Onset   Throat cancer Brother    Heart disease Father    Heart disease Mother      Atrial Fibrillation Management history:  Previous antiarrhythmic drugs: amiodarone Previous cardioversions: 02/2022 Previous ablations: 10/06/22 Anticoagulation history: Eliquis   Past Medical History:  Diagnosis Date   CAD (coronary artery disease)    CKD (chronic kidney disease)    COPD (chronic obstructive pulmonary disease) (HCC)    Diabetes mellitus without complication (HCC)    Hyperlipemia    Hypertension    Myocardial infarction (HCC)    OSA on CPAP    Peripheral neuropathy    Past Surgical History:  Procedure Laterality Date   ABDOMINAL AORTIC ANEURYSM REPAIR     ATRIAL FIBRILLATION ABLATION N/A 10/06/2022   Procedure: ATRIAL FIBRILLATION ABLATION;  Surgeon: Maurice Small, MD;  Location: [facility];  Service: Cardiovascular;  Laterality: N/A;   CARDIOVERSION N/A 02/14/2022   Procedure: CARDIOVERSION;  Surgeon: Lewayne Bunting, MD;  Location: [facility] ENDOSCOPY;  Service: Cardiovascular;  Laterality: N/A;   CARDIOVERSION N/A 11/12/2022   Procedure: CARDIOVERSION;  Surgeon: Thurmon Fair, MD;  Location: [facility] CV [facility];  Service: Cardiovascular;  Laterality: N/A;   LUMBAR DISC SURGERY  1997   LUNG SURGERY     found spot on lung 10 years ago    Current Outpatient Medications  Medication Sig Dispense Refill   acetaminophen (TYLENOL) 500 MG tablet Take 1,000 mg by mouth every 6 (six) hours as needed for moderate pain.     albuterol (PROVENTIL HFA;VENTOLIN HFA) 108 (90 BASE) MCG/ACT inhaler Inhale 2 puffs into the lungs 4 (four)  times daily as needed for wheezing or shortness of breath.     Alpha-Lipoic Acid 600 MG CAPS Take 1 capsule by mouth every morning.     amLODipine (NORVASC) 10 MG tablet Take 1 tablet (10 mg total) by mouth daily. 90 tablet 3   apixaban (ELIQUIS) 5 MG TABS tablet Take 1 tablet (5 mg total) by mouth 2 (two) times daily. 180 tablet 3   atorvastatin (LIPITOR) 80 MG tablet Take 80 mg by mouth at bedtime.     Cholecalciferol (VITAMIN D3)  5000 units CAPS Take 5,000 Units by mouth daily.     empagliflozin (JARDIANCE) 25 MG TABS tablet Take 25 mg by mouth daily.     gabapentin (NEURONTIN) 300 MG capsule Take 300 mg by mouth at bedtime.     glimepiride (AMARYL) 2 MG tablet Take 2 mg by mouth daily with breakfast.     glucose blood (ACCU-CHEK SMARTVIEW) test strip See admin instructions.     losartan-hydrochlorothiazide (HYZAAR) 50-12.5 MG tablet Take 1 tablet by mouth daily. 90 tablet 3   MAGNESIUM PO Take 2 tablets by mouth See admin instructions. Take 2 tablets daily except skip dose on Sundays     metFORMIN (GLUCOPHAGE-XR) 500 MG 24 hr tablet Take 500 mg by mouth 2 (two) times daily.     Multiple Vitamins-Minerals (PRESERVISION AREDS PO) Take 1 tablet by mouth 2 (two) times daily.     nitroGLYCERIN (NITROSTAT) 0.4 MG SL tablet Place 0.4 mg under the tongue every 5 (five) minutes x 3 doses as needed for chest pain (Call 911 at 3rd dose within 15 minutes.).     pantoprazole (PROTONIX) 40 MG tablet TAKE ONE TABLET BY MOUTH DAILY (TAKE ON AN EMPTY STOMACH 30 MINUTES PRIOR TO A MEAL)     pioglitazone (ACTOS) 30 MG tablet Take 15 mg by mouth daily.     Semaglutide,0.25 or 0.5MG /DOS, 2 MG/3ML SOPN Inject 0.5 mg as directed once a week. Takes on Mondays     Tiotropium Bromide-Olodaterol (STIOLTO RESPIMAT) 2.5-2.5 MCG/ACT AERS Inhale 2 each into the lungs daily.     amiodarone (PACERONE) 200 MG tablet Take 1 tablet (200 mg total) by mouth daily. 90 tablet 2   No current facility-administered medications for this encounter.    No Known Allergies  ROS- All systems are reviewed and negative except as per the HPI above.  Physical Exam: Vitals:   01/26/23 0927  BP: 116/68  Pulse: 65  Weight: 99.1 kg  Height: 6\' 1"  (1.854 m)    GEN- The patient is well appearing, alert and oriented x 3 today.   Neck - no JVD or carotid bruit noted Lungs- Clear to ausculation bilaterally, normal work of breathing Heart- Regular rate and rhythm, no  murmurs, rubs or gallops, PMI not laterally displaced Extremities- no clubbing, cyanosis, or edema Skin - no rash or ecchymosis noted   Wt Readings from Last 3 Encounters:  01/26/23 99.1 kg  01/08/23 101.9 kg  12/02/22 102.7 kg    EKG today demonstrates  Vent. rate 65 BPM PR interval 172 ms QRS duration 160 ms QT/QTcB 462/480 ms P-R-T axes 69 131 53 Normal sinus rhythm Right bundle branch block Septal infarct , age undetermined Abnormal ECG When compared with ECG of 08-Jan-2023 11:44, sinus rhythm replaced AF Confirmed by Carolan Clines ([id number]) on 01/26/2023 10:01:09 AM  Echo 09/23/21 demonstrated  1. Left ventricular ejection fraction, by estimation, is 60 to 65%. Left  ventricular ejection fraction by 3D volume is 61 %.  The left ventricle has  normal function. The left ventricle has no regional wall motion  abnormalities. Left ventricular diastolic parameters are consistent with Grade I diastolic dysfunction (impaired relaxation).   2. Right ventricular systolic function is normal. The right ventricular  size is normal.   3. Left atrial size was mildly dilated.   4. The mitral valve is grossly normal. Trivial mitral valve  regurgitation.   5. The aortic valve is tricuspid. Aortic valve regurgitation is not  visualized.   6. Aortic dilatation noted. There is mild dilatation of the ascending  aorta, measuring 40 mm.   7. The inferior vena cava is normal in size with greater than 50%  respiratory variability, suggesting right atrial pressure of 3 mmHg.    Epic records are reviewed at length today.  CHA2DS2-VASc Score =   5 The patient's score is based upon:  CAD DM HTN Age        ASSESSMENT AND PLAN: 1. Persistent Atrial Fibrillation (ICD10:  I48.19) The patient's CHA2DS2-VASc score is 5  , indicating a  7.2% annual risk of stroke.   S/p afib ablation 10/06/22 Patient seen by Dr. Nelly Laurence on 6/27 and started on amiodarone for ERAF and PVCs.  He is in NSR  today. Continue amiodarone 200 mg daily. Continue Eliquis 5 mg BID. Continue Toprol 25 mg daily.  He had labs in May; will obtain repeat Cmet and TSH in 3 months.   2. Secondary Hypercoagulable State (ICD10:  D68.69) The patient is at significant risk for stroke/thromboembolism based upon his CHA2DS2-VASc Score of 5  .  Continue Apixaban (Eliquis).   3. CAD S/p multiple DES No anginal symptoms.    4. HTN Stable, no changes today.    Follow up 3 months Afib clinic.   Lake Bells, PA-C Afib Clinic [facility] [address] [place], [place] [address] [phone] 01/26/2023 10:17 AM

## 2023-04-20 ENCOUNTER — Ambulatory Visit (HOSPITAL_COMMUNITY)
Admission: RE | Admit: 2023-04-20 | Discharge: 2023-04-20 | Disposition: A | Discharge status: Home / Self Care | Payer: Medicare HMO | Source: Ambulatory Visit | Attending: Internal Medicine | Admitting: Internal Medicine

## 2023-04-20 VITALS — BP 128/72 | HR 61 | Ht 73.0 in | Wt 211.0 lb

## 2023-04-20 DIAGNOSIS — Z7901 Long term (current) use of anticoagulants: Secondary | ICD-10-CM | POA: Insufficient documentation

## 2023-04-20 DIAGNOSIS — Z7985 Long-term (current) use of injectable non-insulin antidiabetic drugs: Secondary | ICD-10-CM | POA: Insufficient documentation

## 2023-04-20 DIAGNOSIS — D6869 Other thrombophilia: Secondary | ICD-10-CM | POA: Diagnosis not present

## 2023-04-20 DIAGNOSIS — I2510 Atherosclerotic heart disease of native coronary artery without angina pectoris: Secondary | ICD-10-CM | POA: Insufficient documentation

## 2023-04-20 DIAGNOSIS — Z79899 Other long term (current) drug therapy: Secondary | ICD-10-CM | POA: Insufficient documentation

## 2023-04-20 DIAGNOSIS — I4819 Other persistent atrial fibrillation: Secondary | ICD-10-CM | POA: Insufficient documentation

## 2023-04-20 DIAGNOSIS — E785 Hyperlipidemia, unspecified: Secondary | ICD-10-CM | POA: Insufficient documentation

## 2023-04-20 DIAGNOSIS — N189 Chronic kidney disease, unspecified: Secondary | ICD-10-CM | POA: Diagnosis not present

## 2023-04-20 DIAGNOSIS — Z7984 Long term (current) use of oral hypoglycemic drugs: Secondary | ICD-10-CM | POA: Insufficient documentation

## 2023-04-20 DIAGNOSIS — Z5181 Encounter for therapeutic drug level monitoring: Secondary | ICD-10-CM

## 2023-04-20 DIAGNOSIS — I129 Hypertensive chronic kidney disease with stage 1 through stage 4 chronic kidney disease, or unspecified chronic kidney disease: Secondary | ICD-10-CM | POA: Diagnosis not present

## 2023-04-20 DIAGNOSIS — I493 Ventricular premature depolarization: Secondary | ICD-10-CM | POA: Insufficient documentation

## 2023-04-20 DIAGNOSIS — J449 Chronic obstructive pulmonary disease, unspecified: Secondary | ICD-10-CM | POA: Diagnosis not present

## 2023-04-20 DIAGNOSIS — E1122 Type 2 diabetes mellitus with diabetic chronic kidney disease: Secondary | ICD-10-CM | POA: Insufficient documentation

## 2023-04-20 DIAGNOSIS — I4891 Unspecified atrial fibrillation: Secondary | ICD-10-CM | POA: Diagnosis not present

## 2023-04-20 LAB — COMPREHENSIVE METABOLIC PANEL
ALT: 30 U/L (ref 0–44)
AST: 21 U/L (ref 15–41)
Albumin: 3.8 g/dL (ref 3.5–5.0)
Alkaline Phosphatase: 70 U/L (ref 38–126)
Anion gap: 11 (ref 5–15)
BUN: 11 mg/dL (ref 8–23)
CO2: 26 mmol/L (ref 22–32)
Calcium: 9.4 mg/dL (ref 8.9–10.3)
Chloride: 102 mmol/L (ref 98–111)
Creatinine, Ser: 1.2 mg/dL (ref 0.61–1.24)
GFR, Estimated: >60 mL/min (ref >60)
Glucose, Bld: 216 mg/dL — ABNORMAL HIGH (ref 70–99)
Potassium: 3.9 mmol/L (ref 3.5–5.1)
Sodium: 139 mmol/L (ref 135–145)
Total Bilirubin: 1.6 mg/dL — ABNORMAL HIGH (ref 0.3–1.2)
Total Protein: 6.8 g/dL (ref 6.5–8.1)

## 2023-04-20 LAB — TSH: TSH: 2.037 u[IU]/mL (ref 0.350–4.500)

## 2023-04-20 NOTE — Progress Notes (Signed)
Primary Care Physician: Daisy Floro, MD Primary Cardiologist: Dr Anne Fu (former Dr Katrinka Blazing pt) Primary Electrophysiologist: Dr Nelly Laurence Referring Physician: Dr Alger Memos is a 77 y.o. male with a history of CAD, PAD, DM, COPD, HTN, HLD, AAA s/p repair, atrial fibrillation who presents for follow up in the [facility]. He underwent DC cardioversion in August 2023 and had early recurrence of AF. He reports that he is very symptomatic with atrial fibrillation with fatigue and SOB with exertion. He underwent afib ablation with Dr Nelly Laurence on 10/06/22. Patient is on Eliquis for a CHADS2VASC score of 5.  On follow up today, patient reports that he has not felt much different post ablation. He is in rate controlled afib today with symptoms of fatigue, legs feel heavy. He denies chest pain, swallowing pain, or groin issues.   On follow up 01/26/23, he is currently in NSR. Taking amiodarone as of 6/27 OV with Dr. Nelly Laurence due to [facility] and PVCs. He feels less nauseous since transition to once daily over the weekend. He thought he was still in Afib due to feeling like having no energy. Dr. Morrie Sheldon plan is to reassess in 6 months for consideration of repeat ablation. No missed doses of Eliquis.  On follow up 04/20/23, he is currently in NSR. Taking amiodarone 200 mg daily. He has no bleeding issues on Eliquis. No episodes of Afib per patient.   Today, he denies symptoms of palpitations, chest pain, shortness of breath, orthopnea, PND, lower extremity edema, dizziness, presyncope, syncope, snoring, daytime somnolence, bleeding, or neurologic sequela. The patient is tolerating medications without difficulties and is otherwise without complaint today.    Atrial Fibrillation Risk Factors:  he does not have symptoms or diagnosis of sleep apnea. he does not have a history of rheumatic fever.   he has a BMI of Body mass index is 27.84 kg/m.Marland Kitchen Filed Weights   04/20/23  0859  Weight: 95.7 kg    Family History  Problem Relation Age of Onset   Throat cancer Brother    Heart disease Father    Heart disease Mother     Atrial Fibrillation Management history:  Previous antiarrhythmic drugs: amiodarone Previous cardioversions: 02/2022 Previous ablations: 10/06/22 Anticoagulation history: Eliquis   Past Medical History:  Diagnosis Date   CAD (coronary artery disease)    CKD (chronic kidney disease)    COPD (chronic obstructive pulmonary disease) (HCC)    Diabetes mellitus without complication (HCC)    Hyperlipemia    Hypertension    Myocardial infarction (HCC)    OSA on CPAP    Peripheral neuropathy    Past Surgical History:  Procedure Laterality Date   ABDOMINAL AORTIC ANEURYSM REPAIR     ATRIAL FIBRILLATION ABLATION N/A 10/06/2022   Procedure: ATRIAL FIBRILLATION ABLATION;  Surgeon: Maurice Small, MD;  Location: [facility] INVASIVE CV LAB;  Service: Cardiovascular;  Laterality: N/A;   CARDIOVERSION N/A 02/14/2022   Procedure: CARDIOVERSION;  Surgeon: Lewayne Bunting, MD;  Location: [facility] ENDOSCOPY;  Service: Cardiovascular;  Laterality: N/A;   CARDIOVERSION N/A 11/12/2022   Procedure: CARDIOVERSION;  Surgeon: Thurmon Fair, MD;  Location: [facility] INVASIVE CV LAB;  Service: Cardiovascular;  Laterality: N/A;   LUMBAR DISC SURGERY  1997   LUNG SURGERY     found spot on lung 10 years ago    Current Outpatient Medications  Medication Sig Dispense Refill   acetaminophen (TYLENOL) 500 MG tablet Take 1,000 mg by mouth every 6 (  six) hours as needed for moderate pain.     albuterol (PROVENTIL HFA;VENTOLIN HFA) 108 (90 BASE) MCG/ACT inhaler Inhale 2 puffs into the lungs 4 (four) times daily as needed for wheezing or shortness of breath.     Alpha-Lipoic Acid 600 MG CAPS Take 1 capsule by mouth every morning.     amiodarone (PACERONE) 200 MG tablet Take 1 tablet (200 mg total) by mouth daily. 90 tablet 2   amLODipine (NORVASC) 10 MG tablet Take 1 tablet (10 mg  total) by mouth daily. 90 tablet 3   apixaban (ELIQUIS) 5 MG TABS tablet Take 1 tablet (5 mg total) by mouth 2 (two) times daily. 180 tablet 3   atorvastatin (LIPITOR) 80 MG tablet Take 80 mg by mouth at bedtime.     Cholecalciferol (VITAMIN D3) 5000 units CAPS Take 5,000 Units by mouth daily.     empagliflozin (JARDIANCE) 25 MG TABS tablet Take 25 mg by mouth daily.     gabapentin (NEURONTIN) 300 MG capsule Take 300 mg by mouth at bedtime.     Semaglutide,0.25 or 0.5MG /DOS, 2 MG/3ML SOPN Inject 0.5 mg as directed once a week. Takes on Mondays     glimepiride (AMARYL) 2 MG tablet Take 2 mg by mouth daily with breakfast.     glucose blood (ACCU-CHEK SMARTVIEW) test strip See admin instructions.     losartan-hydrochlorothiazide (HYZAAR) 50-12.5 MG tablet Take 1 tablet by mouth daily. 90 tablet 3   MAGNESIUM PO Take 2 tablets by mouth See admin instructions. Take 2 tablets daily except skip dose on Sundays     metFORMIN (GLUCOPHAGE-XR) 500 MG 24 hr tablet Take 500 mg by mouth 2 (two) times daily.     Multiple Vitamins-Minerals (PRESERVISION AREDS PO) Take 1 tablet by mouth 2 (two) times daily.     nitroGLYCERIN (NITROSTAT) 0.4 MG SL tablet Place 0.4 mg under the tongue every 5 (five) minutes x 3 doses as needed for chest pain (Call 911 at 3rd dose within 15 minutes.).     pantoprazole (PROTONIX) 40 MG tablet TAKE ONE TABLET BY MOUTH DAILY (TAKE ON AN EMPTY STOMACH 30 MINUTES PRIOR TO A MEAL)     pioglitazone (ACTOS) 30 MG tablet Take 15 mg by mouth daily.     Tiotropium Bromide-Olodaterol (STIOLTO RESPIMAT) 2.5-2.5 MCG/ACT AERS Inhale 2 each into the lungs daily.     No current facility-administered medications for this encounter.    No Known Allergies  ROS- All systems are reviewed and negative except as per the HPI above.  Physical Exam: Vitals:   04/20/23 0859  BP: 128/72  Pulse: 61  Weight: 95.7 kg  Height: 6\' 1"  (1.854 m)    GEN- The patient is well appearing, alert and oriented x  3 today.   Neck - no JVD or carotid bruit noted Lungs- Clear to ausculation bilaterally, normal work of breathing Heart- Regular rate and rhythm, no murmurs, rubs or gallops, PMI not laterally displaced Extremities- no clubbing, cyanosis, or edema Skin - no rash or ecchymosis noted   Wt Readings from Last 3 Encounters:  04/20/23 95.7 kg  01/26/23 99.1 kg  01/08/23 101.9 kg    EKG today demonstrates  Vent. rate 61 BPM PR interval 204 ms QRS duration 166 ms QT/QTcB 474/477 ms P-R-T axes 77 133 38 Normal sinus rhythm Right bundle branch block Septal infarct , age undetermined Abnormal ECG When compared with ECG of 26-Jan-2023 09:59, PREVIOUS ECG IS PRESENT  Echo 09/23/21 demonstrated  1. Left ventricular  ejection fraction, by estimation, is 60 to 65%. Left  ventricular ejection fraction by 3D volume is 61 %. The left ventricle has  normal function. The left ventricle has no regional wall motion  abnormalities. Left ventricular diastolic parameters are consistent with Grade I diastolic dysfunction (impaired relaxation).   2. Right ventricular systolic function is normal. The right ventricular  size is normal.   3. Left atrial size was mildly dilated.   4. The mitral valve is grossly normal. Trivial mitral valve  regurgitation.   5. The aortic valve is tricuspid. Aortic valve regurgitation is not  visualized.   6. Aortic dilatation noted. There is mild dilatation of the ascending  aorta, measuring 40 mm.   7. The inferior vena cava is normal in size with greater than 50%  respiratory variability, suggesting right atrial pressure of 3 mmHg.    Epic records are reviewed at length today.  CHA2DS2-VASc Score =   5 The patient's score is based upon:  CAD DM HTN Age        ASSESSMENT AND PLAN: 1. Persistent Atrial Fibrillation (ICD10:  I48.19) The patient's CHA2DS2-VASc score is 5  , indicating a  7.2% annual risk of stroke.   S/p afib ablation 10/06/22 Patient seen by  Dr. Nelly Laurence on 6/27 and started on amiodarone for ERAF and PVCs.  He is in NSR today. Qtc stable. Continue amiodarone 200 mg daily. Continue Eliquis 5 mg BID. Continue Toprol 25 mg daily.  Amiodarone labs obtained today.  2. Secondary Hypercoagulable State (ICD10:  D68.69) The patient is at significant risk for stroke/thromboembolism based upon his CHA2DS2-VASc Score of 5  .  Continue Apixaban (Eliquis).   3. CAD S/p multiple DES No anginal symptoms.    4. HTN Stable, no changes today.    Follow up 6 months Afib clinic.   Lake Bells, PA-C Afib Clinic [facility] [address] [place], [place] [address] [phone] 04/20/2023 9:40 AM

## 2023-04-23 DIAGNOSIS — E118 Type 2 diabetes mellitus with unspecified complications: Secondary | ICD-10-CM | POA: Diagnosis not present

## 2023-04-23 DIAGNOSIS — I10 Essential (primary) hypertension: Secondary | ICD-10-CM | POA: Diagnosis not present

## 2023-04-23 DIAGNOSIS — E782 Mixed hyperlipidemia: Secondary | ICD-10-CM | POA: Diagnosis not present

## 2023-04-23 DIAGNOSIS — I2510 Atherosclerotic heart disease of native coronary artery without angina pectoris: Secondary | ICD-10-CM | POA: Diagnosis not present

## 2023-04-27 ENCOUNTER — Other Ambulatory Visit: Payer: Self-pay

## 2023-04-27 ENCOUNTER — Encounter (HOSPITAL_COMMUNITY): Payer: Self-pay

## 2023-04-27 ENCOUNTER — Emergency Department (HOSPITAL_COMMUNITY)
Admission: EM | Admit: 2023-04-27 | Discharge: 2023-04-27 | Disposition: A | Discharge status: Home / Self Care | Payer: No Typology Code available for payment source | Attending: Emergency Medicine | Admitting: Emergency Medicine

## 2023-04-27 ENCOUNTER — Emergency Department (HOSPITAL_COMMUNITY): Payer: No Typology Code available for payment source

## 2023-04-27 DIAGNOSIS — W458XXA Other foreign body or object entering through skin, initial encounter: Secondary | ICD-10-CM | POA: Diagnosis not present

## 2023-04-27 DIAGNOSIS — S60450A Superficial foreign body of right index finger, initial encounter: Secondary | ICD-10-CM | POA: Insufficient documentation

## 2023-04-27 DIAGNOSIS — Z7901 Long term (current) use of anticoagulants: Secondary | ICD-10-CM | POA: Diagnosis not present

## 2023-04-27 DIAGNOSIS — Z23 Encounter for immunization: Secondary | ICD-10-CM | POA: Insufficient documentation

## 2023-04-27 DIAGNOSIS — S6991XA Unspecified injury of right wrist, hand and finger(s), initial encounter: Secondary | ICD-10-CM | POA: Diagnosis present

## 2023-04-27 DIAGNOSIS — M19041 Primary osteoarthritis, right hand: Secondary | ICD-10-CM | POA: Diagnosis not present

## 2023-04-27 DIAGNOSIS — M7989 Other specified soft tissue disorders: Secondary | ICD-10-CM | POA: Diagnosis not present

## 2023-04-27 DIAGNOSIS — Z6828 Body mass index (BMI) 28.0-28.9, adult: Secondary | ICD-10-CM | POA: Diagnosis not present

## 2023-04-27 DIAGNOSIS — T148XXA Other injury of unspecified body region, initial encounter: Secondary | ICD-10-CM | POA: Diagnosis not present

## 2023-04-27 MED ORDER — CEPHALEXIN 500 MG PO CAPS: 500.0000 mg | ORAL_CAPSULE | Freq: Three times a day (TID) | ORAL | 0 refills | Status: AC

## 2023-04-27 MED ORDER — LIDOCAINE HCL (PF) 1 % IJ SOLN
30.0000 mL | Freq: Once | INTRAMUSCULAR | Status: AC
  Administered 2023-04-27: 30 mL
  Filled 2023-04-27: qty 30

## 2023-04-27 MED ORDER — CEPHALEXIN 250 MG PO CAPS
500.0000 mg | ORAL_CAPSULE | Freq: Once | ORAL | Status: AC
  Administered 2023-04-27: 500 mg via ORAL
  Filled 2023-04-27: qty 2

## 2023-04-27 MED ORDER — TETANUS-DIPHTH-ACELL PERTUSSIS 5-2.5-18.5 LF-MCG/0.5 IM SUSY
0.5000 mL | PREFILLED_SYRINGE | Freq: Once | INTRAMUSCULAR | Status: AC
  Administered 2023-04-27: 0.5 mL via INTRAMUSCULAR
  Filled 2023-04-27: qty 0.5

## 2023-04-27 MED ORDER — CEPHALEXIN 500 MG PO CAPS: 500.0000 mg | ORAL_CAPSULE | Freq: Three times a day (TID) | ORAL | 0 refills | Status: DC

## 2023-04-27 NOTE — Discharge Instructions (Signed)
Take Keflex 3 times per day for the next 3 days.  Wash the area, look for signs of infection like redness, swelling or pus.  Return to the emergency department if you are unable to move that finger, or noticed infection developing.  Follow-up with your primary care doctor next week.

## 2023-04-27 NOTE — ED Provider Notes (Signed)
[place] EMERGENCY DEPARTMENT AT [facility] Provider Note   CSN: [id number] Arrival date & time: 04/27/23  1059     History {Add pertinent medical, surgical, social history, OB history to HPI:1} Chief Complaint  Patient presents with   Finger Injury    LESHAUN BIEBEL is a 77 y.o. male.  77 year old male here today because he has an ice pick stuck in his second finger.  He was trying to hang a picture, his hand slipped and the ice pack went through his finger.        Home Medications Prior to Admission medications   Medication Sig Start Date End Date Taking? Authorizing Provider  acetaminophen (TYLENOL) 500 MG tablet Take 1,000 mg by mouth every 6 (six) hours as needed for moderate pain.    [provider]  albuterol (PROVENTIL HFA;VENTOLIN HFA) 108 (90 BASE) MCG/ACT inhaler Inhale 2 puffs into the lungs 4 (four) times daily as needed for wheezing or shortness of breath.    [provider]  Alpha-Lipoic Acid 600 MG CAPS Take 1 capsule by mouth every morning.    [provider]  amiodarone (PACERONE) 200 MG tablet Take 1 tablet (200 mg total) by mouth daily. 01/26/23   Eustace Pen, PA-C  amLODipine (NORVASC) 10 MG tablet Take 1 tablet (10 mg total) by mouth daily. 08/02/19   Lyn Records, MD  apixaban (ELIQUIS) 5 MG TABS tablet Take 1 tablet (5 mg total) by mouth 2 (two) times daily. 02/28/22   Lyn Records, MD  atorvastatin (LIPITOR) 80 MG tablet Take 80 mg by mouth at bedtime.    [provider]  Cholecalciferol (VITAMIN D3) 5000 units CAPS Take 5,000 Units by mouth daily.    [provider]  empagliflozin (JARDIANCE) 25 MG TABS tablet Take 25 mg by mouth daily.    [provider]  gabapentin (NEURONTIN) 300 MG capsule Take 300 mg by mouth at bedtime. 03/04/21   [provider]  glimepiride (AMARYL) 2 MG tablet Take 2 mg by mouth daily with breakfast.    [provider]  glucose blood  (ACCU-CHEK SMARTVIEW) test strip See admin instructions.    [provider]  losartan-hydrochlorothiazide (HYZAAR) 50-12.5 MG tablet Take 1 tablet by mouth daily. 08/20/20   Lyn Records, MD  MAGNESIUM PO Take 2 tablets by mouth See admin instructions. Take 2 tablets daily except skip dose on Sundays    [provider]  metFORMIN (GLUCOPHAGE-XR) 500 MG 24 hr tablet Take 500 mg by mouth 2 (two) times daily.    [provider]  Multiple Vitamins-Minerals (PRESERVISION AREDS PO) Take 1 tablet by mouth 2 (two) times daily.    [provider]  nitroGLYCERIN (NITROSTAT) 0.4 MG SL tablet Place 0.4 mg under the tongue every 5 (five) minutes x 3 doses as needed for chest pain (Call 911 at 3rd dose within 15 minutes.).    [provider]  pantoprazole (PROTONIX) 40 MG tablet TAKE ONE TABLET BY MOUTH DAILY (TAKE ON AN EMPTY STOMACH 30 MINUTES PRIOR TO A MEAL) 11/24/22   [provider]  pioglitazone (ACTOS) 30 MG tablet Take 15 mg by mouth daily. 03/04/21   [provider]  Semaglutide,0.25 or 0.5MG /DOS, 2 MG/3ML SOPN Inject 0.5 mg as directed once a week. Takes on Mondays 11/25/22   [provider]  Tiotropium Bromide-Olodaterol (STIOLTO RESPIMAT) 2.5-2.5 MCG/ACT AERS Inhale 2 each into the lungs daily.    [provider]  Allergies    Patient has no known allergies.    Review of Systems   Review of Systems  Physical Exam Updated Vital Signs BP 127/75 (BP Location: Right Arm)   Pulse 76   Temp (!) 97.3 F (36.3 C)   Resp 17   Ht 6\' 1"  (1.854 m)   Wt 93.4 kg   SpO2 96%   BMI 27.18 kg/m  Physical Exam Vitals reviewed.  Musculoskeletal:        General: Normal range of motion.     Comments: There is a metal ice pick, extending through the patient's between the PIP and DIP killer to the orientation of the bone.  Goes through musculature, no active bleeding.  Neurological:     Comments: Intact sensation in finger.   Will assess additional function once foreign body is removed.     ED Results / Procedures / Treatments   Labs (all labs ordered are listed, but only abnormal results are displayed) Labs Reviewed - No data to display  EKG None  Radiology No results found.  Procedures Procedures  {Document cardiac monitor, telemetry assessment procedure when appropriate:1}  Medications Ordered in ED Medications  Tdap (BOOSTRIX) injection 0.5 mL (has no administration in time range)  lidocaine (PF) (XYLOCAINE) 1 % injection 30 mL (has no administration in time range)    ED Course/ Medical Decision Making/ A&P   {   Click here for ABCD2, HEART and other calculatorsREFRESH Note before signing :1}                              Medical Decision Making 77 year old male here with a metal ice pick in his finger.  Plan-quite impressive, this is a screwdriver type ice pick.  Handling all is intact in the patient's hand.  Low suspicion for fragment, will obtain plain films once foreign bodies removed.  Will assess tendons once foreign body is removed.  Pending lidocaine anesthesia.  Reassessment-was able to remove the patient's foreign body without much difficulty.  Patient had intact FDS, FDP function in the index finger.  Tendon function tested at all joints in the finger and was normal.  Intact sensation.  Copiously irrigated the area.  Plain films did not show any fracture.  Of the patient follow-up with his PCP.  Prophylactic Keflex sent.  Amount and/or Complexity of Data Reviewed Radiology: ordered.  Risk Prescription drug management.   ***  {Document critical care time when appropriate:1} {Document review of labs and clinical decision tools ie heart score, Chads2Vasc2 etc:1}  {Document your independent review of radiology images, and any outside records:1} {Document your discussion with family members, caretakers, and with consultants:1} {Document social determinants of health affecting  pt's care:1} {Document your decision making why or why not admission, treatments were needed:1} Final Clinical Impression(s) / ED Diagnoses Final diagnoses:  None    Rx / DC Orders ED Discharge Orders     None

## 2023-04-27 NOTE — ED Triage Notes (Signed)
Pt to ED from home. Pt was trying to add a hole in a picture frame with an ice pick. Ice pick is in right index finger at second joint. Pt states he attempted to pull out and was unsuccessful. +PMS distal to injury. Movement minimal d/t pain. Unknown when last tetanus.

## 2023-04-28 DIAGNOSIS — S60450A Superficial foreign body of right index finger, initial encounter: Secondary | ICD-10-CM | POA: Diagnosis not present

## 2023-06-02 DIAGNOSIS — I4891 Unspecified atrial fibrillation: Secondary | ICD-10-CM | POA: Diagnosis not present

## 2023-06-02 DIAGNOSIS — E1142 Type 2 diabetes mellitus with diabetic polyneuropathy: Secondary | ICD-10-CM | POA: Diagnosis not present

## 2023-06-02 DIAGNOSIS — Z6827 Body mass index (BMI) 27.0-27.9, adult: Secondary | ICD-10-CM | POA: Diagnosis not present

## 2023-06-02 DIAGNOSIS — G629 Polyneuropathy, unspecified: Secondary | ICD-10-CM | POA: Diagnosis not present

## 2023-06-02 DIAGNOSIS — Z0000 Encounter for general adult medical examination without abnormal findings: Secondary | ICD-10-CM | POA: Diagnosis not present

## 2023-06-02 DIAGNOSIS — I5032 Chronic diastolic (congestive) heart failure: Secondary | ICD-10-CM | POA: Diagnosis not present

## 2023-06-02 DIAGNOSIS — D6869 Other thrombophilia: Secondary | ICD-10-CM | POA: Diagnosis not present

## 2023-06-02 DIAGNOSIS — I77810 Thoracic aortic ectasia: Secondary | ICD-10-CM | POA: Diagnosis not present

## 2023-06-04 NOTE — Progress Notes (Signed)
Cardiology Office Note    Patient Name: Samuel Watkins Date of Encounter: 06/05/2023  Primary Care Provider:  Daisy Floro, MD Primary Cardiologist:  Donato Schultz, MD Primary Electrophysiologist: Maurice Small, MD   Past Medical History    Past Medical History:  Diagnosis Date   CAD (coronary artery disease)    CKD (chronic kidney disease)    COPD (chronic obstructive pulmonary disease) (HCC)    Diabetes mellitus without complication (HCC)    Hyperlipemia    Hypertension    Myocardial infarction (HCC)    OSA on CPAP    Peripheral neuropathy     History of Present Illness  Samuel Watkins is a 77 y.o. male with a PMH of CAD (BMS to RCA and DES to RCA 2001 and 2008, LAD DES 2008), PAD, DM2, COPD, hypertension, hyperlipidemia, AAA s/p repair, atrial fibrillation who presents today for 29-month follow-up.  Mr. Samuel Watkins was previously followed by [name] and is currently followed by [name] for management of coronary artery disease.  He has a history significant for coronary disease with BMS to RCA and DES to RCA in 2001 and 2008 with additional DES to LAD.  He is followed by [facility] for management of PAD with ABIs completed in 2021 demonstrating moderate left lower extremity disease and right ABI showing normal range.  He developed new onset AF in 12/2021 and was started on Eliquis as well as metoprolol.  He underwent DCCV on 02/2022 with conversion to sinus rhythm.  He remained in sinus rhythm for 2 weeks and then converted back to atrial fibrillation.  He was referred to EP and was seen by Dr. Nelly Laurence on 07/21/2022 and discussed alternate treatments including ablation.  He underwent ablation on 10/06/2022 and was seen in the AF clinic by [name], PA on 11/03/2022 for follow-up.  He was continuing to maintain rate controlled AF.  He endorses no missed episodes of Eliquis and underwent DCCV to sinus rhythm on 11/12/2022.  He was last seen by [name] on 12/02/2022 and was no longer in AF  but was in junctional rhythm with PVCs.  His Toprol was discontinued and patient denied any chest pain or angina during visit.  He was started on amiodarone for PVCs and AF on 12/19/2022.  He was seen in the AF clinic by Lake Bells, PA on 01/2023 and also on 04/20/2023 and was maintaining sinus rhythm while taking amiodarone.  Mr. Samuel Watkins presents today with his wife for 35-month follow-up.  During today's visit he reports dizziness, which he attributes to low blood pressure. He reports that his blood pressure has been low on several occasions, with a recent reading of 90 at his PCP this week. The dizziness is particularly noticeable when he bends over. He denies any episodes of syncope.  We discussed the possibility of his blood pressure being low due to his significant weight loss of greater than 20+ pounds over the past 5 months.  He has recently been on Ozempic and has also noted improvement to his hemoglobin A1c.  He reports that his  atrial fibrillation has been well-controlled with amiodarone, with no recurrence since starting the medication. His last cardiac check-up in October showed a normal rhythm. He also reports no chest pain or shortness of breath beyond his baseline due to COPD.  He also reports pain in his legs and feet, which he attributes to neuropathy. He describes the pain as constant and severe, affecting his entire leg. He also reports  that his feet feel swollen, particularly in the morning. He has been taking alpha lipoic acid for the neuropathy, but reports no improvement. Patient denies chest pain, palpitations, dyspnea, PND, orthopnea, nausea, vomiting, dizziness, syncope, edema, weight gain, or early satiety.  Review of Systems  Please see the history of present illness.    All other systems reviewed and are otherwise negative except as noted above.  Physical Exam    Wt Readings from Last 3 Encounters:  06/05/23 206 lb 12.8 oz (93.8 kg)  04/27/23 206 lb (93.4 kg)  04/20/23 211  lb (95.7 kg)   VS: Vitals:   06/05/23 1027  BP: 96/60  Pulse: (!) 59  SpO2: 96%  ,Body mass index is 27.28 kg/m. GEN: Well nourished, well developed in no acute distress Neck: No JVD; No carotid bruits Pulmonary: Clear to auscultation without rales, wheezing or rhonchi  Cardiovascular: Normal rate. Regular rhythm. Normal S1. Normal S2.   Murmurs: There is no murmur.  ABDOMEN: Soft, non-tender, non-distended EXTREMITIES:  No edema; No deformity   EKG/LABS/ Recent Cardiac Studies   ECG personally reviewed by me today -none completed today  Risk Assessment/Calculations:    CHA2DS2-VASc Score = 6   This indicates a 9.7% annual risk of stroke. The patient's score is based upon: CHF History: 1 HTN History: 1 Diabetes History: 1 Stroke History: 0 Vascular Disease History: 1 Age Score: 2 Gender Score: 0         Lab Results  Component Value Date   WBC 6.9 11/03/2022   HGB 16.2 11/03/2022   HCT 47.7 11/03/2022   MCV 94.1 11/03/2022   PLT 159 11/03/2022   Lab Results  Component Value Date   CREATININE 1.20 04/20/2023   BUN 11 04/20/2023   NA 139 04/20/2023   K 3.9 04/20/2023   CL 102 04/20/2023   CO2 26 04/20/2023   Lab Results  Component Value Date   CHOL 93 (L) 06/17/2019   HDL 30 (L) 06/17/2019   LDLCALC 47 06/17/2019   TRIG 79 06/17/2019   CHOLHDL 3.1 06/17/2019    No results found for: "HGBA1C" Assessment & Plan    1.  Paroxysmal AF: -s/p DCCV x 3 and AF ablation and currently on amiodarone for rate control maintaining sinus rhythm -Continue amiodarone 200 mg daily -Continue Eliquis 5 mg twice daily -CHA2DS2-VASc Score = 6 [CHF History: 1, HTN History: 1, Diabetes History: 1, Stroke History: 0, Vascular Disease History: 1, Age Score: 2, Gender Score: 0].  Therefore, the patient's annual risk of stroke is 9.7 %.      2.  Coronary artery disease: -s/p CAD with BMS to RCA and DES to RCA 2001 and 2008 with additional DES to LAD -Patient reports no chest  pain or angina since his previous follow-up. -Continue current GDMT with Lipitor 80 mg daily  3.  Essential hypertension: -Patient's blood pressure today was but noted to be low at 96/60 -He notes significant weight loss and we will decrease his amlodipine to 5 mg daily -Continue Hyzaar 50/12.5 mg daily -Patient will monitor blood pressures over the next 2 weeks and will contact office if hypotension is still occurring.  4.  Hyperlipidemia: -patient's last LDL cholesterol was 47 -Continue Lipitor 80 mg daily  5.  Peripheral artery disease: -AAA repair -Currently managed by [facility]  6. Chronic Obstructive Pulmonary Disease (COPD) Reports of baseline shortness of breath with minimal activity. No exacerbations reported. -Continue current management.  Disposition: Follow-up with Donato Schultz, MD or APP  in 4 months   Signed, Napoleon Form, Leodis Rains, NP 06/05/2023, 12:43 PM [facility]

## 2023-06-05 ENCOUNTER — Telehealth: Payer: Self-pay | Admitting: Nurse Practitioner

## 2023-06-05 ENCOUNTER — Encounter: Payer: Self-pay | Admitting: Nurse Practitioner

## 2023-06-05 ENCOUNTER — Ambulatory Visit: Payer: Medicare HMO | Attending: Nurse Practitioner | Admitting: Nurse Practitioner

## 2023-06-05 VITALS — BP 96/60 | HR 59 | Ht 73.0 in | Wt 206.8 lb

## 2023-06-05 DIAGNOSIS — I25118 Atherosclerotic heart disease of native coronary artery with other forms of angina pectoris: Secondary | ICD-10-CM

## 2023-06-05 DIAGNOSIS — I739 Peripheral vascular disease, unspecified: Secondary | ICD-10-CM

## 2023-06-05 DIAGNOSIS — I10 Essential (primary) hypertension: Secondary | ICD-10-CM

## 2023-06-05 DIAGNOSIS — I480 Paroxysmal atrial fibrillation: Secondary | ICD-10-CM | POA: Diagnosis not present

## 2023-06-05 DIAGNOSIS — J439 Emphysema, unspecified: Secondary | ICD-10-CM | POA: Diagnosis not present

## 2023-06-05 DIAGNOSIS — E785 Hyperlipidemia, unspecified: Secondary | ICD-10-CM

## 2023-06-05 MED ORDER — AMLODIPINE BESYLATE 10 MG PO TABS: 5.0000 mg | ORAL_TABLET | Freq: Every day | ORAL | Status: DC

## 2023-06-05 NOTE — Telephone Encounter (Signed)
Pt c/o medication issue:  1. Name of Medication:  amLODipine (NORVASC) 10 MG tablet  2. How are you currently taking this medication (dosage and times per day)?   3. Are you having a reaction (difficulty breathing--STAT)?   4. What is your medication issue?    Patient states he spoke with Robin Searing, NP regarding Amlodipine 10 MG once daily and Jardiance 25 MG. He would like to clarify that he has been taking these medications and he splits the Jardiance in half for 12.5 MG daily.

## 2023-06-05 NOTE — Telephone Encounter (Signed)
Patient states he is taking Jardiance 12.5mg  daily--rx reads take 0.5 tablet by mouth every morning for blood sugar per [name].   Per AVS from OV today with Alden Server: [facility] AND CHECK YOUR MEDICATION-CONTACT THE OFFICE AND LET us KNOW WHICH MEDICATION IT IS THAT YOU CUT IN HALF  IF ITS NOT THE NORVASC (AMLODIPINE) THEN CUT IT IN HALF TO 5MG  ONCE A DAY    Instructed patient to take a half tablet of his amlodipine and continue to monitor his BP. Med list updated. Patient verbalized understanding.

## 2023-06-05 NOTE — Telephone Encounter (Signed)
Pt returning nurses phone call. Please advise ?

## 2023-06-05 NOTE — Telephone Encounter (Signed)
Call to patient to verify medications and he states he is not sure because he is not at home to read the bottles.   He will call once he gets back home

## 2023-06-05 NOTE — Patient Instructions (Signed)
Medication Instructions:  GO HOME AND CHECK YOUR MEDICATION-CONTACT THE OFFICE AND LET us KNOW WHICH MEDICATION IT IS THAT YOU CUT IN HALF  IF ITS NOT THE NORVASC (AMLODIPINE) THEN CUT IT IN HALF TO 5MG  ONCE A DAY  *If you need a refill on your cardiac medications before your next appointment, please call your pharmacy*   Lab Work: NONE ORDERED If you have labs (blood work) drawn today and your tests are completely normal, you will receive your results only by: MyChart Message (if you have MyChart) OR A paper copy in the mail If you have any lab test that is abnormal or we need to change your treatment, we will call you to review the results.   Testing/Procedures: NONE ORDERED   Follow-Up: At [facility], you and your health needs are our priority.  As part of our continuing mission to provide you with exceptional heart care, we have created designated Provider Care Teams.  These Care Teams include your primary Cardiologist (physician) and Advanced Practice Providers (APPs -  Physician Assistants and Nurse Practitioners) who all work together to provide you with the care you need, when you need it.  We recommend signing up for the patient portal called "MyChart".  Sign up information is provided on this After Visit Summary.  MyChart is used to connect with patients for Virtual Visits (Telemedicine).  Patients are able to view lab/test results, encounter notes, upcoming appointments, etc.  Non-urgent messages can be sent to your provider as well.   To learn more about what you can do with MyChart, go to [email or web address].    Your next appointment:   4 month(s)  Provider:   Robin Searing, NP        Other Instructions Check your blood pressure daily for 2 weeks, then contact the office with your readings.  Make sure to check 2 hours after your medications.   AVOID these things for 30 minutes before checking your blood pressure: No Drinking caffeine. No Drinking  alcohol. No Eating. No Smoking. No Exercising.  Five minutes before checking your blood pressure: Pee. Sit in a dining chair. Avoid sitting in a soft couch or armchair. Be quiet. Do not talk.

## 2023-06-26 ENCOUNTER — Ambulatory Visit (HOSPITAL_BASED_OUTPATIENT_CLINIC_OR_DEPARTMENT_OTHER)
Admission: RE | Admit: 2023-06-26 | Discharge: 2023-06-26 | Disposition: A | Discharge status: Home / Self Care | Payer: Medicare HMO | Source: Ambulatory Visit | Attending: Body Imaging | Admitting: Body Imaging

## 2023-06-26 DIAGNOSIS — I2510 Atherosclerotic heart disease of native coronary artery without angina pectoris: Secondary | ICD-10-CM | POA: Diagnosis not present

## 2023-06-26 DIAGNOSIS — J432 Centrilobular emphysema: Secondary | ICD-10-CM | POA: Insufficient documentation

## 2023-06-26 DIAGNOSIS — I700 Atherosclerosis of aorta: Secondary | ICD-10-CM | POA: Diagnosis not present

## 2023-06-26 DIAGNOSIS — Z122 Encounter for screening for malignant neoplasm of respiratory organs: Secondary | ICD-10-CM | POA: Diagnosis not present

## 2023-06-26 DIAGNOSIS — K7689 Other specified diseases of liver: Secondary | ICD-10-CM | POA: Diagnosis not present

## 2023-06-26 DIAGNOSIS — Z87891 Personal history of nicotine dependence: Secondary | ICD-10-CM | POA: Insufficient documentation

## 2023-06-29 ENCOUNTER — Encounter: Payer: Self-pay | Admitting: Cardiovascular Disease

## 2023-06-29 ENCOUNTER — Ambulatory Visit: Payer: Medicare HMO | Attending: Cardiovascular Disease | Admitting: Cardiovascular Disease

## 2023-06-29 VITALS — BP 114/58 | HR 50 | Ht 73.0 in | Wt 204.2 lb

## 2023-06-29 DIAGNOSIS — I4819 Other persistent atrial fibrillation: Secondary | ICD-10-CM

## 2023-06-29 DIAGNOSIS — I453 Trifascicular block: Secondary | ICD-10-CM

## 2023-06-29 NOTE — Patient Instructions (Addendum)
Medication Instructions:  STOP Amiodarone *If you need a refill on your cardiac medications before your next appointment, please call your pharmacy*    Follow-Up: At [facility], you and your health needs are our priority.  As part of our continuing mission to provide you with exceptional heart care, we have created designated Provider Care Teams.  These Care Teams include your primary Cardiologist (physician) and Advanced Practice Providers (APPs -  Physician Assistants and Nurse Practitioners) who all work together to provide you with the care you need, when you need it.  We recommend signing up for the patient portal called "MyChart".  Sign up information is provided on this After Visit Summary.  MyChart is used to connect with patients for Virtual Visits (Telemedicine).  Patients are able to view lab/test results, encounter notes, upcoming appointments, etc.  Non-urgent messages can be sent to your provider as well.   To learn more about what you can do with MyChart, go to [email or web address].    Your next appointment:   3 month(s)  Provider:   Augustus Mealor, MD  

## 2023-06-29 NOTE — Progress Notes (Signed)
  Electrophysiology Office Note:    Date:  06/29/2023   ID:  Samuel Watkins, DOB [birth year], MRN [id number]  PCP:  Daisy Floro, MD   [facility] Providers Cardiologist:  Donato Schultz, MD Electrophysiologist:  Maurice Small, MD     Referring MD: Daisy Floro, MD   History of Present Illness:    Samuel Watkins is a 77 y.o. male with a hx listed below, significant for CAD, multiple DES, PAD, DM 2, hypertension, hyperlipidemia, AAA s/p repair, atrial fibrillation, referred for arrhythmia management.  He underwent DC cardioversion in August 2023 and had early recurrence of AF.  He reports that he is very symptomatic with atrial fibrillation.  He is easily fatigued and winded.  He not enjoy doing the things he joined doing in the past due to to fatigue and shortness of breath.  He does not have chest pain, syncope, presyncope.  He underwent A-fib ablation on October 06, 2022.  He had recurrence of atrial fibrillation shortly after the procedure and, when seen in atrial fibrillation clinic on April 22, reported that he did not feel much better. He underwent cardioversion on Nov 12, 2022.  Amiodarone was started due to recurrence of atrial fibrillation in the blinding period as well as high burden of PVCs seen on EKG.    EKGs/Labs/Other Studies Reviewed Today:     TTE 09/2021: normal EF, LA mildly dilated  EKG:       Recent Labs: 11/03/2022: Hemoglobin 16.2; Platelets 159 04/20/2023: ALT 30; BUN 11; Creatinine, Ser 1.20; Potassium 3.9; Sodium 139; TSH 2.037     Physical Exam:    VS:  BP (!) 114/58 (BP Location: Left Arm, Patient Position: Sitting, Cuff Size: Large)   Pulse (!) 50   Ht 6\' 1"  (1.854 m)   Wt 204 lb 3.2 oz (92.6 kg)   SpO2 94%   BMI 26.94 kg/m     Wt Readings from Last 3 Encounters:  06/29/23 204 lb 3.2 oz (92.6 kg)  06/05/23 206 lb 12.8 oz (93.8 kg)  04/27/23 206 lb (93.4 kg)     GEN: Well nourished, well developed in no acute  distress CARDIAC: iRRR, no murmurs, rubs, gallops RESPIRATORY:  Normal work of breathing MUSCULOSKELETAL: no edema    ASSESSMENT & PLAN:    Persistent AF:  symptomatic. Failed DC cardioversion.  S/p ablation 09/2022 with early recurrence amiodarone was started for both PVCs and AF Now that he is out of the post-ablation blinding period, will DC amiodarone Follow-up in 3 months  Frequent PVCs Burden very high on ECG Will assess burden in follow-up  Secondary hypercoagulable state: continue apixaban 5  Hepatitis B  COPD        Medication Adjustments/Labs and Tests Ordered: Current medicines are reviewed at length with the patient today.  Concerns regarding medicines are outlined above.  Orders Placed This Encounter  Procedures   EKG 12-Lead   No orders of the defined types were placed in this encounter.    Signed, Maurice Small, MD  06/29/2023 11:16 AM    [facility]

## 2023-06-30 DIAGNOSIS — R051 Acute cough: Secondary | ICD-10-CM | POA: Diagnosis not present

## 2023-06-30 DIAGNOSIS — J018 Other acute sinusitis: Secondary | ICD-10-CM | POA: Diagnosis not present

## 2023-07-13 ENCOUNTER — Encounter: Payer: Self-pay | Admitting: Diagnostic Neuroimaging

## 2023-07-13 ENCOUNTER — Ambulatory Visit: Payer: Medicare HMO | Admitting: Diagnostic Neuroimaging

## 2023-07-13 VITALS — BP 108/68 | HR 55 | Ht 73.0 in | Wt 202.0 lb

## 2023-07-13 DIAGNOSIS — E1142 Type 2 diabetes mellitus with diabetic polyneuropathy: Secondary | ICD-10-CM

## 2023-07-13 DIAGNOSIS — Z7984 Long term (current) use of oral hypoglycemic drugs: Secondary | ICD-10-CM | POA: Diagnosis not present

## 2023-07-13 NOTE — Progress Notes (Signed)
[facility]  PATIENT: Samuel Watkins DOB: 01-14-1946  REFERRING CLINICIAN: Daisy Floro, MD HISTORY FROM: patient  REASON FOR VISIT: follow up   HISTORICAL  CHIEF COMPLAINT:  Chief Complaint  Patient presents with   New Patient (Initial Visit)    Patient in room #6 and alone. Patient states he has weakness and numbness in both feet and legs. Patient states when climbing stairs his leg can feel like it giving out.     HISTORY OF PRESENT ILLNESS:   UPDATE (07/13/23, VRP): Since last visit, doing about the same. A1c increased to 8.2, now on ozempic, and A1c is better. Numbness in feet continues. Painful at nighttime. He is not sure if he is on gabapentin or not.   PRIOR HPI (07/17/21): 77 year old male with diabetic neuropathy.  Patient diagnosed with diabetes about 10 years ago with hemoglobin A1c's of 8-9.  A1c is improved down to 7.  He had onset of numbness and pain in the toes and feet around the diagnosis of diabetes.  He has struggled with this over time.  He has tried gabapentin 300mg  at bedtime without relief.  Pain starts around 10 PM which is around when he takes gabapentin.  He has not tried higher doses.  He has not tried other medications for this problem.   REVIEW OF SYSTEMS: Full 14 system review of systems performed and negative with exception of: as per HPI.  ALLERGIES: No Known Allergies  HOME MEDICATIONS: Outpatient Medications Prior to Visit  Medication Sig Dispense Refill   acetaminophen (TYLENOL) 500 MG tablet Take 1,000 mg by mouth every 6 (six) hours as needed for moderate pain.     albuterol (PROVENTIL HFA;VENTOLIN HFA) 108 (90 BASE) MCG/ACT inhaler Inhale 2 puffs into the lungs 4 (four) times daily as needed for wheezing or shortness of breath.     Alpha-Lipoic Acid 600 MG CAPS Take 1 capsule by mouth every morning.     amLODipine (NORVASC) 10 MG tablet Take 0.5 tablets (5 mg total) by mouth daily.     apixaban (ELIQUIS) 5 MG  TABS tablet Take 1 tablet (5 mg total) by mouth 2 (two) times daily. 180 tablet 3   atorvastatin (LIPITOR) 80 MG tablet Take 80 mg by mouth at bedtime.     Cholecalciferol (VITAMIN D3) 5000 units CAPS Take 5,000 Units by mouth daily.     empagliflozin (JARDIANCE) 25 MG TABS tablet Take 12.5 mg by mouth daily.     gabapentin (NEURONTIN) 300 MG capsule Take 300 mg by mouth at bedtime.     glimepiride (AMARYL) 2 MG tablet Take 2 mg by mouth daily with breakfast.     glucose blood (ACCU-CHEK SMARTVIEW) test strip See admin instructions.     losartan-hydrochlorothiazide (HYZAAR) 50-12.5 MG tablet Take 1 tablet by mouth daily. 90 tablet 3   MAGNESIUM PO Take 2 tablets by mouth See admin instructions. Take 2 tablets daily except skip dose on Sundays     metFORMIN (GLUCOPHAGE-XR) 500 MG 24 hr tablet Take 500 mg by mouth 2 (two) times daily.     Multiple Vitamins-Minerals (PRESERVISION AREDS PO) Take 1 tablet by mouth 2 (two) times daily.     nitroGLYCERIN (NITROSTAT) 0.4 MG SL tablet Place 0.4 mg under the tongue every 5 (five) minutes x 3 doses as needed for chest pain (Call 911 at 3rd dose within 15 minutes.).     pantoprazole (PROTONIX) 40 MG tablet TAKE ONE TABLET BY MOUTH DAILY (TAKE ON AN EMPTY  STOMACH 30 MINUTES PRIOR TO A MEAL)     pioglitazone (ACTOS) 30 MG tablet Take 15 mg by mouth daily.     Semaglutide,0.25 or 0.5MG /DOS, 2 MG/3ML SOPN Inject 0.5 mg as directed once a week. Takes on Mondays     Tiotropium Bromide-Olodaterol (STIOLTO RESPIMAT) 2.5-2.5 MCG/ACT AERS Inhale 2 each into the lungs daily.     No facility-administered medications prior to visit.    PAST MEDICAL HISTORY: Past Medical History:  Diagnosis Date   CAD (coronary artery disease)    CKD (chronic kidney disease)    COPD (chronic obstructive pulmonary disease) (HCC)    Diabetes mellitus without complication (HCC)    Hyperlipemia    Hypertension    Myocardial infarction (HCC)    OSA on CPAP    Peripheral neuropathy      PAST SURGICAL HISTORY: Past Surgical History:  Procedure Laterality Date   ABDOMINAL AORTIC ANEURYSM REPAIR     ATRIAL FIBRILLATION ABLATION N/A 10/06/2022   Procedure: ATRIAL FIBRILLATION ABLATION;  Surgeon: Maurice Small, MD;  Location: [facility] INVASIVE CV LAB;  Service: Cardiovascular;  Laterality: N/A;   CARDIOVERSION N/A 02/14/2022   Procedure: CARDIOVERSION;  Surgeon: Lewayne Bunting, MD;  Location: [facility] ENDOSCOPY;  Service: Cardiovascular;  Laterality: N/A;   CARDIOVERSION N/A 11/12/2022   Procedure: CARDIOVERSION;  Surgeon: Thurmon Fair, MD;  Location: [facility] INVASIVE CV LAB;  Service: Cardiovascular;  Laterality: N/A;   LUMBAR DISC SURGERY  1997   LUNG SURGERY     found spot on lung 10 years ago    FAMILY HISTORY: Family History  Problem Relation Age of Onset   Throat cancer Brother    Heart disease Father    Heart disease Mother     SOCIAL HISTORY: Social History   Socioeconomic History   Marital status: Married    Spouse name: Meriam Sprague   Number of children: 3   Years of education: 12   Highest education level: Not on file  Occupational History   Occupation: retired  Tobacco Use   Smoking status: Former    Current packs/day: 0.00    Average packs/day: 1 pack/day for 50.0 years (50.0 ttl pk-yrs)    Types: Cigarettes    Start date: 07/14/1960    Quit date: 07/14/2010    Years since quitting: 13.0   Smokeless tobacco: Never   Tobacco comments:    Former smoker 11/03/22  Vaping Use   Vaping status: Never Used  Substance and Sexual Activity   Alcohol use: No    Alcohol/week: 0.0 standard drinks of alcohol   Drug use: No   Sexual activity: Not on file  Other Topics Concern   Not on file  Social History Narrative   Lives with wife   Caffeine- coffee 2 c, soda 1, tea 1   Social Drivers of Corporate investment banker Strain: Not on file  Food Insecurity: Not on file  Transportation Needs: Not on file  Physical Activity: Not on file  Stress: Not on file   Social Connections: Not on file  Intimate Partner Violence: Not on file     PHYSICAL EXAM  GENERAL EXAM/CONSTITUTIONAL: Vitals:  Vitals:   07/13/23 1333  BP: 108/68  Pulse: (!) 55  Weight: 202 lb (91.6 kg)  Height: 6\' 1"  (1.854 m)   Body mass index is 26.65 kg/m. Wt Readings from Last 3 Encounters:  07/13/23 202 lb (91.6 kg)  06/29/23 204 lb 3.2 oz (92.6 kg)  06/05/23 206 lb 12.8 oz (93.8  kg)   Patient is in no distress; well developed, nourished and groomed; neck is supple  CARDIOVASCULAR: Examination of carotid arteries is normal; no carotid bruits Regular rate and rhythm, no murmurs Examination of peripheral vascular system by observation and palpation is normal  EYES: Ophthalmoscopic exam of optic discs and posterior segments is normal; no papilledema or hemorrhages No results found.  MUSCULOSKELETAL: Gait, strength, tone, movements noted in Neurologic exam below  NEUROLOGIC: MENTAL STATUS:      No data to display         awake, alert, oriented to person, place and time recent and remote memory intact normal attention and concentration language fluent, comprehension intact, naming intact fund of knowledge appropriate  CRANIAL NERVE:  2nd - no papilledema on fundoscopic exam 2nd, 3rd, 4th, 6th - pupils equal and reactive to light, visual fields full to confrontation, extraocular muscles intact, no nystagmus 5th - facial sensation symmetric 7th - facial strength symmetric 8th - hearing intact 9th - palate elevates symmetrically, uvula midline 11th - shoulder shrug symmetric 12th - tongue protrusion midline  MOTOR:  normal bulk and tone, full strength in the BUE, BLE  SENSORY:  normal and symmetric to light touch; decr vib at knees (left worse than right) and absent at toes  COORDINATION:  finger-nose-finger, fine finger movements normal  REFLEXES:  deep tendon reflexes TRACE and symmetric  GAIT/STATION:  narrow based  gait     DIAGNOSTIC DATA (LABS, IMAGING, TESTING) - I reviewed patient records, labs, notes, testing and imaging myself where available.  Lab Results  Component Value Date   WBC 6.9 11/03/2022   HGB 16.2 11/03/2022   HCT 47.7 11/03/2022   MCV 94.1 11/03/2022   PLT 159 11/03/2022      Component Value Date/Time   NA 139 04/20/2023 0920   NA 137 09/22/2022 1005   K 3.9 04/20/2023 0920   CL 102 04/20/2023 0920   CO2 26 04/20/2023 0920   GLUCOSE 216 (H) 04/20/2023 0920   BUN 11 04/20/2023 0920   BUN 21 09/22/2022 1005   CREATININE 1.20 04/20/2023 0920   CALCIUM 9.4 04/20/2023 0920   PROT 6.8 04/20/2023 0920   PROT 6.6 12/24/2021 0835   ALBUMIN 3.8 04/20/2023 0920   ALBUMIN 4.3 12/24/2021 0835   AST 21 04/20/2023 0920   ALT 30 04/20/2023 0920   ALKPHOS 70 04/20/2023 0920   BILITOT 1.6 (H) 04/20/2023 0920   BILITOT 1.5 (H) 12/24/2021 0835   GFRNONAA >60 04/20/2023 0920   GFRAA 72 09/05/2019 1100   Lab Results  Component Value Date   CHOL 93 (L) 06/17/2019   HDL 30 (L) 06/17/2019   LDLCALC 47 06/17/2019   TRIG 79 06/17/2019   CHOLHDL 3.1 06/17/2019   No results found for: "HGBA1C" No results found for: "VITAMINB12" Lab Results  Component Value Date   TSH 1.305 Test methodology is 3rd generation TSH 07/09/2007      ASSESSMENT AND PLAN  77 y.o. year old male here with:   Dx:  1. Diabetic polyneuropathy associated with type 2 diabetes mellitus (HCC)      PLAN:  DIABETIC NEUROPATHY  - continue to improve diabetic control  - may increase gabapentin to 600mg  at 5pm; gradually may increase up to 900mg  twice a day   - try capsaicin cream, lidocaine patch / cream, alpha-lipoic acid 600mg  daily  - Other Painful neuropathy treatment options: duloxetine 30-60mg  daily, amitriptyline 25-50mg  at bedtime, pregabalin 75-150mg  twice a day  Return for return to  PCP, pending if symptoms worsen or fail to improve.    Suanne Marker, MD 07/13/2023, 2:28  PM Certified in Neurology, Neurophysiology and Neuroimaging  [facility] [address], [address] [place], [place] [address] [phone]

## 2023-07-13 NOTE — Patient Instructions (Signed)
DIABETIC NEUROPATHY  - continue to improve diabetic control  - may increase gabapentin to 600mg  at 5pm; gradually may increase up to 900mg  twice a day   - try capsaicin cream, lidocaine patch / cream, alpha-lipoic acid 600mg  daily  - Other Painful neuropathy treatment options: duloxetine 30-60mg  daily, amitriptyline 25-50mg  at bedtime, pregabalin 75-150mg  twice a day

## 2023-07-20 ENCOUNTER — Telehealth: Payer: Self-pay | Admitting: Acute Care

## 2023-07-20 ENCOUNTER — Other Ambulatory Visit: Payer: Self-pay

## 2023-07-20 DIAGNOSIS — Z87891 Personal history of nicotine dependence: Secondary | ICD-10-CM

## 2023-07-20 DIAGNOSIS — Z122 Encounter for screening for malignant neoplasm of respiratory organs: Secondary | ICD-10-CM

## 2023-07-20 NOTE — Telephone Encounter (Signed)
 Spoke with patient by phone, using two patient identifiers, to review results of LDCT.  No suspicious findings for lung cancer.  Emphysema and atherosclerosis noted, as previously seen. Calcifications noted on the aortic valve.  Patient sees cardiology and results/note to review findings were routed to Dr. Jeffrie.  Results also routed to PCP regarding the liver findings and possible recommendation for MRI.  Advised patient we would have PCP review and advise on further imaging, as per recommendation by radiology.  Patient acknowledged understanding and had no further questions.  Will follow up with PCP regarding liver findings.  Order placed for annual LDCT.

## 2023-07-23 ENCOUNTER — Emergency Department (HOSPITAL_COMMUNITY): Payer: HMO

## 2023-07-23 ENCOUNTER — Emergency Department (HOSPITAL_COMMUNITY)
Admission: EM | Admit: 2023-07-23 | Discharge: 2023-07-23 | Disposition: A | Discharge status: Home / Self Care | Payer: HMO | Attending: Emergency Medicine | Admitting: Emergency Medicine

## 2023-07-23 DIAGNOSIS — I129 Hypertensive chronic kidney disease with stage 1 through stage 4 chronic kidney disease, or unspecified chronic kidney disease: Secondary | ICD-10-CM | POA: Insufficient documentation

## 2023-07-23 DIAGNOSIS — J449 Chronic obstructive pulmonary disease, unspecified: Secondary | ICD-10-CM

## 2023-07-23 DIAGNOSIS — J441 Chronic obstructive pulmonary disease with (acute) exacerbation: Secondary | ICD-10-CM | POA: Insufficient documentation

## 2023-07-23 DIAGNOSIS — N189 Chronic kidney disease, unspecified: Secondary | ICD-10-CM | POA: Insufficient documentation

## 2023-07-23 DIAGNOSIS — I2510 Atherosclerotic heart disease of native coronary artery without angina pectoris: Secondary | ICD-10-CM | POA: Diagnosis not present

## 2023-07-23 DIAGNOSIS — Z87891 Personal history of nicotine dependence: Secondary | ICD-10-CM | POA: Insufficient documentation

## 2023-07-23 DIAGNOSIS — E1122 Type 2 diabetes mellitus with diabetic chronic kidney disease: Secondary | ICD-10-CM | POA: Insufficient documentation

## 2023-07-23 DIAGNOSIS — R059 Cough, unspecified: Secondary | ICD-10-CM | POA: Diagnosis present

## 2023-07-23 DIAGNOSIS — Z20822 Contact with and (suspected) exposure to covid-19: Secondary | ICD-10-CM | POA: Insufficient documentation

## 2023-07-23 LAB — CBC WITH DIFFERENTIAL/PLATELET
Abs Immature Granulocytes: 0.05 10*3/uL (ref 0.00–0.07)
Basophils Absolute: 0 10*3/uL (ref 0.0–0.1)
Basophils Relative: 0 %
Eosinophils Absolute: 0.1 10*3/uL (ref 0.0–0.5)
Eosinophils Relative: 1 %
HCT: 47.3 % (ref 39.0–52.0)
Hemoglobin: 16.1 g/dL (ref 13.0–17.0)
Immature Granulocytes: 1 %
Lymphocytes Relative: 14 %
Lymphs Abs: 1.6 10*3/uL (ref 0.7–4.0)
MCH: 29.9 pg (ref 26.0–34.0)
MCHC: 34 g/dL (ref 30.0–36.0)
MCV: 87.9 fL (ref 80.0–100.0)
Monocytes Absolute: 1 10*3/uL (ref 0.1–1.0)
Monocytes Relative: 9 %
Neutro Abs: 8 10*3/uL — ABNORMAL HIGH (ref 1.7–7.7)
Neutrophils Relative %: 75 %
Platelets: 178 10*3/uL (ref 150–400)
RBC: 5.38 MIL/uL (ref 4.22–5.81)
RDW: 13.8 % (ref 11.5–15.5)
WBC: 10.8 10*3/uL — ABNORMAL HIGH (ref 4.0–10.5)
nRBC: 0 % (ref 0.0–0.2)

## 2023-07-23 LAB — COMPREHENSIVE METABOLIC PANEL
ALT: 29 U/L (ref 0–44)
AST: 24 U/L (ref 15–41)
Albumin: 3.6 g/dL (ref 3.5–5.0)
Alkaline Phosphatase: 77 U/L (ref 38–126)
Anion gap: 11 (ref 5–15)
BUN: 22 mg/dL (ref 8–23)
CO2: 24 mmol/L (ref 22–32)
Calcium: 9.5 mg/dL (ref 8.9–10.3)
Chloride: 104 mmol/L (ref 98–111)
Creatinine, Ser: 1.5 mg/dL — ABNORMAL HIGH (ref 0.61–1.24)
GFR, Estimated: 48 mL/min — ABNORMAL LOW (ref >60)
Glucose, Bld: 182 mg/dL — ABNORMAL HIGH (ref 70–99)
Potassium: 4 mmol/L (ref 3.5–5.1)
Sodium: 139 mmol/L (ref 135–145)
Total Bilirubin: 1.5 mg/dL — ABNORMAL HIGH (ref 0.0–1.2)
Total Protein: 6.9 g/dL (ref 6.5–8.1)

## 2023-07-23 LAB — RESP PANEL BY RT-PCR (RSV, FLU A&B, COVID)  RVPGX2
Influenza A by PCR: NEGATIVE
Influenza B by PCR: NEGATIVE
Resp Syncytial Virus by PCR: POSITIVE — AB
SARS Coronavirus 2 by RT PCR: NEGATIVE

## 2023-07-23 LAB — MAGNESIUM: Magnesium: 2.3 mg/dL (ref 1.7–2.4)

## 2023-07-23 MED ORDER — AZITHROMYCIN 250 MG PO TABS: 250.0000 mg | ORAL_TABLET | Freq: Every day | ORAL | 0 refills | Status: DC

## 2023-07-23 MED ORDER — PREDNISONE 10 MG PO TABS: 40.0000 mg | ORAL_TABLET | Freq: Every day | ORAL | 0 refills | Status: AC

## 2023-07-23 NOTE — ED Provider Triage Note (Signed)
 Emergency Medicine Provider Triage Evaluation Note  Samuel Watkins , a 78 y.o. male  was evaluated in triage.  Pt complains of URI symptoms for about a week.  States he has cough, lack of energy.  Was seen at urgent care and found to be hypotensive.  Review of Systems  Positive: As above Negative: As above  Physical Exam  BP 98/65 (BP Location: Right Arm)   Pulse 70   Temp 97.9 F (36.6 C)   Resp (!) 22   SpO2 90%  Gen:   Awake, no distress   Resp:  Normal effort  MSK:   Moves extremities without difficulty  Other:    Medical Decision Making  Medically screening exam initiated at 2:31 PM.  Appropriate orders placed.  Samuel Watkins was informed that the remainder of the evaluation will be completed by another provider, this initial triage assessment does not replace that evaluation, and the importance of remaining in the ED until their evaluation is complete.    Hildegard Loge, PA-C 07/23/23 1432

## 2023-07-23 NOTE — ED Notes (Signed)
 Patient verbalizes understanding of discharge instructions. Opportunity for questioning and answers were provided. Pt discharged from ED.

## 2023-07-23 NOTE — ED Provider Notes (Signed)
 [place] EMERGENCY DEPARTMENT AT [facility] Provider Note  HPI   Samuel Watkins is a 78 y.o. male patient with a PMHx of CAD, COPD on room air, status post stents, diabetes who is here today with concern for cough.  Patient states that has been coughing for the past couple of weeks since Christmas, and his cough is persisted, maybe some mild increase sputum production, he has not really had any chest pain shortness of breath fevers chills abdominal pain back pain urinary symptoms.  He is here today with persistence, was seen at primary care clinic, was told to come in here to evaluate for pneumonia    ROS Negative except as per HPI   Medical Decision Making   Upon presentation, the patient is afebrile hemodynamically stable overall appears well  I do not see any tachypnea there is no wheezing no intercostal retractions, prior to me even seeing him, has labs already done magnesium 2.3 very mild leukocytosis of 10.8, no other major hematologic abnormalities, his creatinine is elevated at 1.50, but does not appear to be markedly elevated from his baseline around 1.45, no other major metabolic derangement glucose 182 no anion gap suggest DKA it is 11, his bilirubin is slightly elevated at 1.5.  His chest x-ray is clear of any focal opacity that would suggest pneumonia, however he is positive for RSV on our viral swab panel  This patient most likely is coming today with a viral process versus a COPD exacerbation, he may have some increase sputum production is having increased cough, does not fact have COPD and this persistent cough.  We are going to treat this patient with prednisone  and a azithromycin  pack, however, will counseled patient regarding the fact that this may mess up his sugars over the next couple of days, he is going to be very diligent, keep a close eye on this, and will call his doctor if he has any major problems with his sugar.  Will come back if any acute changes.    At this time, I feel that the patient is medically cleared for discharge and have discussed this with my attending who agrees.  I discussed with the patient and/or family my overall assessment, including my physical exam, labs, imaging, other diagnostic tests, and therapeutics given.  All questions answered and understanding is expressed.  I have instructed to call PCP to establish an outpatient appointment after this ED visit, and necessary specialty follow up if needed. I gave strict return precautions to come back to the ED including fevers, chills, severe pain, worsening of symptoms, return of symptoms, new and concerning symptoms, inability to tolerate p.o. intake, among others. I specifically stated to return if symptoms worsen or return   1. Chronic obstructive pulmonary disease, unspecified COPD type (HCC)     @DISPOSITION @  Rx / DC Orders ED Discharge Orders          Ordered    predniSONE  (DELTASONE ) 10 MG tablet  Daily        07/23/23 1705    azithromycin  (ZITHROMAX ) 250 MG tablet  Daily        07/23/23 1705             Past Medical History:  Diagnosis Date   CAD (coronary artery disease)    CKD (chronic kidney disease)    COPD (chronic obstructive pulmonary disease) (HCC)    Diabetes mellitus without complication (HCC)    Hyperlipemia    Hypertension    Myocardial  infarction (HCC)    OSA on CPAP    Peripheral neuropathy    Past Surgical History:  Procedure Laterality Date   ABDOMINAL AORTIC ANEURYSM REPAIR     ATRIAL FIBRILLATION ABLATION N/A 10/06/2022   Procedure: ATRIAL FIBRILLATION ABLATION;  Surgeon: Nancey Eulas BRAVO, MD;  Location: [facility] INVASIVE CV LAB;  Service: Cardiovascular;  Laterality: N/A;   CARDIOVERSION N/A 02/14/2022   Procedure: CARDIOVERSION;  Surgeon: Pietro Redell RAMAN, MD;  Location: [facility] ENDOSCOPY;  Service: Cardiovascular;  Laterality: N/A;   CARDIOVERSION N/A 11/12/2022   Procedure: CARDIOVERSION;  Surgeon: Francyne Headland, MD;  Location: [facility]  INVASIVE CV LAB;  Service: Cardiovascular;  Laterality: N/A;   LUMBAR DISC SURGERY  1997   LUNG SURGERY     found spot on lung 10 years ago   Family History  Problem Relation Age of Onset   Throat cancer Brother    Heart disease Father    Heart disease Mother    Social History   Socioeconomic History   Marital status: Married    Spouse name: Rojelio   Number of children: 3   Years of education: 12   Highest education level: Not on file  Occupational History   Occupation: retired  Tobacco Use   Smoking status: Former    Current packs/day: 0.00    Average packs/day: 1 pack/day for 50.0 years (50.0 ttl pk-yrs)    Types: Cigarettes    Start date: 07/14/1960    Quit date: 07/14/2010    Years since quitting: 13.0   Smokeless tobacco: Never   Tobacco comments:    Former smoker 11/03/22  Vaping Use   Vaping status: Never Used  Substance and Sexual Activity   Alcohol use: No    Alcohol/week: 0.0 standard drinks of alcohol   Drug use: No   Sexual activity: Not on file  Other Topics Concern   Not on file  Social History Narrative   Lives with wife   Caffeine- coffee 2 c, soda 1, tea 1   Social Drivers of Corporate Investment Banker Strain: Not on file  Food Insecurity: Not on file  Transportation Needs: Not on file  Physical Activity: Not on file  Stress: Not on file  Social Connections: Not on file  Intimate Partner Violence: Not on file     Physical Exam   Vitals:   07/23/23 1413 07/23/23 1638 07/23/23 1639 07/23/23 1640  BP: 98/65 115/79    Pulse: 70  72   Resp: (!) 22   20  Temp: 97.9 F (36.6 C)   97.9 F (36.6 C)  TempSrc:    Oral  SpO2: 90%  94%     Physical Exam Vitals and nursing note reviewed.  Constitutional:      General: He is not in acute distress.    Appearance: Normal appearance. He is well-developed. He is not ill-appearing or toxic-appearing.  HENT:     Head: Normocephalic and atraumatic.     Right Ear: External ear normal.     Left Ear:  External ear normal.     Nose: Nose normal.     Mouth/Throat:     Mouth: Mucous membranes are moist.  Eyes:     Extraocular Movements: Extraocular movements intact.     Pupils: Pupils are equal, round, and reactive to light.  Cardiovascular:     Rate and Rhythm: Normal rate.     Pulses: Normal pulses.  Pulmonary:     Effort: Pulmonary effort is normal. No respiratory  distress.     Breath sounds: Normal breath sounds. No stridor. No wheezing, rhonchi or rales.     Comments: Has a mild occasional cough Abdominal:     Palpations: Abdomen is soft.     Tenderness: There is no abdominal tenderness. There is no right CVA tenderness or left CVA tenderness.  Musculoskeletal:        General: Normal range of motion.     Cervical back: Normal range of motion and neck supple.  Skin:    General: Skin is warm and dry.     Capillary Refill: Capillary refill takes less than 2 seconds.  Neurological:     General: No focal deficit present.     Mental Status: He is alert and oriented to person, place, and time. Mental status is at baseline.  Psychiatric:        Mood and Affect: Mood normal.      Procedures   If procedures were preformed on this patient, they are listed below:  Procedures  The patient was seen, evaluated, and treated in conjunction with the attending physician, who voiced agreement in the care provided.  Note generated using Dragon voice dictation software and may contain dictation errors. Please contact me for any clarification or with any questions.   Electronically signed by:  Fairy Kerby Revere, M.D. (PGY-2)    Revere Fairy, MD 07/23/23 1705    Darra Fonda MATSU, MD 07/24/23 2157

## 2023-07-23 NOTE — ED Triage Notes (Signed)
 Pt reports one week of cough, congestion, and runny nose. Pt denies fevers. Was at [facility] today and referred for hypotension.

## 2023-07-23 NOTE — Discharge Instructions (Addendum)
 You have been seen here for RSV and COPD exacebration. Take abx and steroids. Please come back if anything changes. You do not have signs of pneumonia.

## 2023-07-28 ENCOUNTER — Other Ambulatory Visit: Payer: Self-pay | Admitting: Family Medicine

## 2023-07-28 DIAGNOSIS — R918 Other nonspecific abnormal finding of lung field: Secondary | ICD-10-CM

## 2023-09-05 ENCOUNTER — Ambulatory Visit
Admission: RE | Admit: 2023-09-05 | Discharge: 2023-09-05 | Disposition: A | Discharge status: Home / Self Care | Payer: PPO | Source: Ambulatory Visit | Attending: Family Medicine | Admitting: Family Medicine

## 2023-09-05 DIAGNOSIS — R918 Other nonspecific abnormal finding of lung field: Secondary | ICD-10-CM

## 2023-09-05 MED ORDER — GADOPICLENOL 0.5 MMOL/ML IV SOLN
9.0000 mL | Freq: Once | INTRAVENOUS | Status: AC | PRN
  Administered 2023-09-05: 9 mL via INTRAVENOUS

## 2023-09-13 NOTE — Progress Notes (Unsigned)
 Cardiology Office Note    Patient Name: Samuel Watkins Date of Encounter: 09/14/2023  Primary Care Provider:  Daisy Floro, MD Primary Cardiologist:  Donato Schultz, MD Primary Electrophysiologist: Maurice Small, MD   Past Medical History    Past Medical History:  Diagnosis Date   CAD (coronary artery disease)    CKD (chronic kidney disease)    COPD (chronic obstructive pulmonary disease) (HCC)    Diabetes mellitus without complication (HCC)    Hyperlipemia    Hypertension    Myocardial infarction (HCC)    OSA on CPAP    Peripheral neuropathy     History of Present Illness  Samuel Watkins is a 78 y.o. male with a PMH of CAD (BMS to RCA and DES to RCA 2001 and 2008, LAD DES 2008), PAD, DM2, COPD, hypertension, hyperlipidemia, AAA s/p repair, atrial fibrillation who presents today for 29-month follow-up.  Samuel Watkins was seen last on 06/05/2023 for 34-month follow-up.  He reported dizziness which he attributed to low BP.  We discussed the possibility of symptoms being related to recent 20 pound weight loss over 5 months while taking Ozempic.  Is maintaining rate control with amiodarone 200 mg daily and amlodipine was decreased to 5 mg.  He was seen by Dr. Nelly Laurence and amiodarone was discontinued with plan to continue to monitor PVC burden.  He was evaluated by neurology on 07/13/2023 to weakness and numbness in his feet.  He was advised to increase gabapentin and also improve glycemic control.  Samuel Watkins presents with low blood pressure, possibly related to recent weight loss.  Patient blood pressure today was 82/48 and had taken BP medications prior to today's visit.  The patient has been taking amlodipine and Hyzaar for hypertension,  The patient reports feeling fine despite the low blood pressure, with no symptoms of dizziness. The patient's weight loss appears to have plateaued, with a current weight of around 207 pounds in the morning. He also reports a recent hospital visit due  to RSV, which has since cleared up. However, a recent MRI of the abdomen has raised concerns about possible metastatic disease in the liver. The patient reports no changes in bowel habits, but does experience constipation, which he attributes to his medication.  He also has a history of lung nodules which he completed a CT and an incidental finding as noted above was seen.  Valuated by neurology regarding his neuropathy in his feet.  He was satisfied with the neurologist does not feel that the gabapentin is not helping.  Patient denies chest pain, palpitations, dyspnea, PND, orthopnea, nausea, vomiting, dizziness, syncope, edema, weight gain, or early satiety.   Review of Systems  Please see the history of present illness.    All other systems reviewed and are otherwise negative except as noted above.  Physical Exam    Wt Readings from Last 3 Encounters:  09/14/23 202 lb 9.6 oz (91.9 kg)  07/13/23 202 lb (91.6 kg)  06/29/23 204 lb 3.2 oz (92.6 kg)   VS: Vitals:   09/14/23 1046  BP: (!) 82/48  Pulse: 60  SpO2: 95%  ,Body mass index is 26.73 kg/m. GEN: Well nourished, well developed in no acute distress Neck: No JVD; No carotid bruits Pulmonary: Clear to auscultation without rales, wheezing or rhonchi  Cardiovascular: Normal rate. Regular rhythm. Normal S1. Normal S2.   Murmurs: There is no murmur.  ABDOMEN: Soft, non-tender, non-distended EXTREMITIES:  No edema; No deformity   EKG/LABS/ Recent Cardiac  Studies   ECG personally reviewed by me today -none completed today  Risk Assessment/Calculations:    CHA2DS2-VASc Score = 6   This indicates a 9.7% annual risk of stroke. The patient's score is based upon: CHF History: 1 HTN History: 1 Diabetes History: 1 Stroke History: 0 Vascular Disease History: 1 Age Score: 2 Gender Score: 0         Lab Results  Component Value Date   WBC 10.8 (H) 07/23/2023   HGB 16.1 07/23/2023   HCT 47.3 07/23/2023   MCV 87.9 07/23/2023    PLT 178 07/23/2023   Lab Results  Component Value Date   CREATININE 1.50 (H) 07/23/2023   BUN 22 07/23/2023   NA 139 07/23/2023   K 4.0 07/23/2023   CL 104 07/23/2023   CO2 24 07/23/2023   Lab Results  Component Value Date   CHOL 93 (L) 06/17/2019   HDL 30 (L) 06/17/2019   LDLCALC 47 06/17/2019   TRIG 79 06/17/2019   CHOLHDL 3.1 06/17/2019    No results found for: "HGBA1C" Assessment & Plan    1.Coronary artery disease: -s/p CAD with BMS to RCA and DES to RCA 2001 and 2008 with additional DES to LAD -Patient reports no chest pain or angina since previous follow-up. -Continue current GDMT with atorvastatin 80 mg daily currently not on ASA 81 mg due to Eliquis  2.  Persistent AF: -Patient recently had amiodarone discontinued and is currently rate controlled at 60 bpm -Continue Eliquis 5 mg twice daily  3.  Essential hypertension : -Patient's blood pressure today was soft at 82/48 and is currently asymptomatic -He has lost significant amount of weight and we will discontinue amlodipine 5 mg today with plan to discontinue further medication if blood pressures remain low.  4.  Hyperlipidemia: -Patient's last LDL cholesterol was 47 -Continue statin 80 mg daily  5. Peripheral artery disease: -AAA repair -Currently managed by [facility]  6. Chronic Obstructive Pulmonary Disease (COPD)  -Followed by pulmonology CT scan showing stable nodules and incidental finding of liver abnormality -Recent MRI showed marked heterogenous appearance of the liver with nodular contour, raising the possibility of diffuse metastatic disease. No symptoms reported. -Recommend patient to contact primary care physician (Dr. Tenny Craw) for further management, including possible liver biopsy.     Disposition: Follow-up with Donato Schultz, MD or APP in 6 months    Signed, Napoleon Form, Leodis Rains, NP 09/14/2023, 11:00 AM [facility]

## 2023-09-14 ENCOUNTER — Ambulatory Visit: Payer: PPO | Attending: Nurse Practitioner | Admitting: Nurse Practitioner

## 2023-09-14 ENCOUNTER — Encounter: Payer: Self-pay | Admitting: Nurse Practitioner

## 2023-09-14 VITALS — BP 82/48 | HR 60 | Ht 73.0 in | Wt 202.6 lb

## 2023-09-14 DIAGNOSIS — I10 Essential (primary) hypertension: Secondary | ICD-10-CM

## 2023-09-14 DIAGNOSIS — I4819 Other persistent atrial fibrillation: Secondary | ICD-10-CM

## 2023-09-14 DIAGNOSIS — J439 Emphysema, unspecified: Secondary | ICD-10-CM | POA: Diagnosis not present

## 2023-09-14 DIAGNOSIS — I25118 Atherosclerotic heart disease of native coronary artery with other forms of angina pectoris: Secondary | ICD-10-CM | POA: Diagnosis not present

## 2023-09-14 DIAGNOSIS — E785 Hyperlipidemia, unspecified: Secondary | ICD-10-CM

## 2023-09-14 NOTE — Patient Instructions (Signed)
 Medication Instructions:  Stop Amlodipine *If you need a refill on your cardiac medications before your next appointment, please call your pharmacy*  Follow-Up: At [facility], you and your health needs are our priority.  As part of our continuing mission to provide you with exceptional heart care, we have created designated Provider Care Teams.  These Care Teams include your primary Cardiologist (physician) and Advanced Practice Providers (APPs -  Physician Assistants and Nurse Practitioners) who all work together to provide you with the care you need, when you need it.  We recommend signing up for the patient portal called "MyChart".  Sign up information is provided on this After Visit Summary.  MyChart is used to connect with patients for Virtual Visits (Telemedicine).  Patients are able to view lab/test results, encounter notes, upcoming appointments, etc.  Non-urgent messages can be sent to your provider as well.   To learn more about what you can do with MyChart, go to [email or web address].    Your next appointment:   6 months  Provider:   Anne Fu, MD  Other Instructions Monitor blood pressure for 2 weeks and send [place] the readings via MyChart    1st Floor: - Lobby - Registration  - Pharmacy  - Lab - Cafe  2nd Floor: - PV Lab - Diagnostic Testing (echo, CT, nuclear med)  3rd Floor: - Vacant  4th Floor: - TCTS (cardiothoracic surgery) - AFib Clinic - Structural Heart Clinic - Vascular Surgery  - Vascular Ultrasound  5th Floor: - HeartCare Cardiology (general and EP) - Clinical Pharmacy for coumadin, hypertension, lipid, weight-loss medications, and med management appointments    Valet parking services will be available as well.

## 2023-10-06 ENCOUNTER — Encounter: Payer: Self-pay | Admitting: Cardiovascular Disease

## 2023-10-06 ENCOUNTER — Ambulatory Visit: Payer: Medicare HMO | Attending: Cardiovascular Disease | Admitting: Cardiovascular Disease

## 2023-10-06 VITALS — BP 100/50 | HR 71 | Resp 16 | Ht 73.0 in | Wt 207.4 lb

## 2023-10-06 DIAGNOSIS — I10 Essential (primary) hypertension: Secondary | ICD-10-CM | POA: Diagnosis not present

## 2023-10-06 DIAGNOSIS — I25118 Atherosclerotic heart disease of native coronary artery with other forms of angina pectoris: Secondary | ICD-10-CM

## 2023-10-06 DIAGNOSIS — I4819 Other persistent atrial fibrillation: Secondary | ICD-10-CM

## 2023-10-06 NOTE — Patient Instructions (Signed)
Medication Instructions:  Your physician recommends that you continue on your current medications as directed. Please refer to the Current Medication list given to you today. *If you need a refill on your cardiac medications before your next appointment, please call your pharmacy*   Follow-Up: At [facility], you and your health needs are our priority.  As part of our continuing mission to provide you with exceptional heart care, we have created designated Provider Care Teams.  These Care Teams include your primary Cardiologist (physician) and Advanced Practice Providers (APPs -  Physician Assistants and Nurse Practitioners) who all work together to provide you with the care you need, when you need it.  We recommend signing up for the patient portal called "MyChart".  Sign up information is provided on this After Visit Summary.  MyChart is used to connect with patients for Virtual Visits (Telemedicine).  Patients are able to view lab/test results, encounter notes, upcoming appointments, etc.  Non-urgent messages can be sent to your provider as well.   To learn more about what you can do with MyChart, go to [email or web address].    Your next appointment:   6 month(s)  Provider:   You will see one of the following Advanced Practice Providers on your designated Care Team:   Francis Dowse, Charlott Holler [address] [place], [place] Sherie Don, NP Canary Brim, NP

## 2023-10-06 NOTE — Progress Notes (Signed)
  Electrophysiology Office Note:    Date:  10/06/2023   ID:  ACEL NATZKE, DOB 04/02/1946, MRN [id number]  PCP:  Daisy Floro, MD   [facility] Providers Cardiologist:  Donato Schultz, MD Electrophysiologist:  Maurice Small, MD     Referring MD: Daisy Floro, MD   History of Present Illness:    Samuel Watkins is a 78 y.o. male with a hx listed below, significant for CAD, multiple DES, PAD, DM 2, hypertension, hyperlipidemia, AAA s/p repair, atrial fibrillation, referred for arrhythmia management.  He underwent DC cardioversion in August 2023 and had early recurrence of AF.  He reports that he is very symptomatic with atrial fibrillation.  He is easily fatigued and winded.  He not enjoy doing the things he joined doing in the past due to to fatigue and shortness of breath.  He does not have chest pain, syncope, presyncope.  He underwent A-fib ablation on October 06, 2022.  He had recurrence of atrial fibrillation shortly after the procedure and, when seen in atrial fibrillation clinic on April 22, reported that he did not feel much better. He underwent cardioversion on Nov 12, 2022.  Amiodarone was started due to recurrence of atrial fibrillation in the blinding period as well as high burden of PVCs seen on EKG.    EKGs/Labs/Other Studies Reviewed Today:     TTE 09/2021: normal EF, LA mildly dilated  EKG:       Recent Labs: 04/20/2023: TSH 2.037 07/23/2023: ALT 29; BUN 22; Creatinine, Ser 1.50; Hemoglobin 16.1; Magnesium 2.3; Platelets 178; Potassium 4.0; Sodium 139     Physical Exam:    VS:  BP (!) 100/50 (BP Location: Left Arm, Patient Position: Sitting, Cuff Size: Normal)   Pulse 71   Resp 16   Ht 6\' 1"  (1.854 m)   Wt 207 lb 6.4 oz (94.1 kg)   SpO2 95%   BMI 27.36 kg/m     Wt Readings from Last 3 Encounters:  10/06/23 207 lb 6.4 oz (94.1 kg)  09/14/23 202 lb 9.6 oz (91.9 kg)  07/13/23 202 lb (91.6 kg)     GEN: Well nourished, well  developed in no acute distress CARDIAC: iRRR, no murmurs, rubs, gallops RESPIRATORY:  Normal work of breathing MUSCULOSKELETAL: no edema    ASSESSMENT & PLAN:    Persistent AF:  symptomatic. Failed DC cardioversion.  S/p ablation 09/2022 with early recurrence, managed temporarily with amiodarone amiodarone was started during the blinding period for both PVCs and AF Now that he is out of the post-ablation blinding period, he is maintaining sinus rhythm   Frequent PVCs Burden high on ECG in the past No PVCs on most recent ECGs  Secondary hypercoagulable state: continue apixaban 5  Hepatitis B  COPD        Medication Adjustments/Labs and Tests Ordered: Current medicines are reviewed at length with the patient today.  Concerns regarding medicines are outlined above.  Orders Placed This Encounter  Procedures   EKG 12-Lead   No orders of the defined types were placed in this encounter.    Signed, Maurice Small, MD  10/06/2023 11:05 AM    [facility]

## 2023-10-14 DIAGNOSIS — E1142 Type 2 diabetes mellitus with diabetic polyneuropathy: Secondary | ICD-10-CM | POA: Diagnosis not present

## 2023-10-14 DIAGNOSIS — M792 Neuralgia and neuritis, unspecified: Secondary | ICD-10-CM | POA: Diagnosis not present

## 2023-10-14 DIAGNOSIS — I739 Peripheral vascular disease, unspecified: Secondary | ICD-10-CM | POA: Diagnosis not present

## 2023-12-14 ENCOUNTER — Ambulatory Visit (HOSPITAL_COMMUNITY)
Admission: RE | Admit: 2023-12-14 | Discharge: 2023-12-14 | Disposition: A | Discharge status: Home / Self Care | Payer: Medicare HMO | Source: Ambulatory Visit | Attending: Internal Medicine | Admitting: Internal Medicine

## 2023-12-14 ENCOUNTER — Encounter (HOSPITAL_COMMUNITY): Payer: Self-pay | Admitting: Internal Medicine

## 2023-12-14 VITALS — BP 118/66 | HR 64 | Ht 73.0 in | Wt 206.6 lb

## 2023-12-14 DIAGNOSIS — I4819 Other persistent atrial fibrillation: Secondary | ICD-10-CM | POA: Diagnosis not present

## 2023-12-14 DIAGNOSIS — D6869 Other thrombophilia: Secondary | ICD-10-CM

## 2023-12-14 NOTE — Progress Notes (Signed)
 Primary Care Physician: Jimmey Mould, MD Primary Cardiologist: Dr Renna Cary (former Dr Felipe Horton pt) Primary Electrophysiologist: Dr Arlester Ladd Referring Physician: Dr Marlyn Sines is a 77 y.o. male with a history of CAD, PAD, DM, COPD, HTN, HLD, AAA s/p repair, atrial fibrillation who presents for follow up in the [facility]. He underwent DC cardioversion in August 2023 and had early recurrence of AF. He reports that he is very symptomatic with atrial fibrillation with fatigue and SOB with exertion. He underwent afib ablation with Dr Arlester Ladd on 10/06/22. Patient is on Eliquis  for a CHADS2VASC score of 5.  On follow up today, patient reports that he has not felt much different post ablation. He is in rate controlled afib today with symptoms of fatigue, legs feel heavy. He denies chest pain, swallowing pain, or groin issues.   On follow up 01/26/23, he is currently in NSR. Taking amiodarone  as of 6/27 OV with Dr. Arlester Ladd due to [facility] and PVCs. He feels less nauseous since transition to once daily over the weekend. He thought he was still in Afib due to feeling like having no energy. Dr. Katheryne Pane plan is to reassess in 6 months for consideration of repeat ablation. No missed doses of Eliquis .  On follow up 04/20/23, he is currently in NSR. Taking amiodarone  200 mg daily. He has no bleeding issues on Eliquis . No episodes of Afib per patient.   On follow up 12/14/23, he is currently in NSR. He was on amiodarone  previously but no longer on it. He has had no episodes of Afib since last office visit.   Today, he denies symptoms of palpitations, chest pain, shortness of breath, orthopnea, PND, lower extremity edema, dizziness, presyncope, syncope, snoring, daytime somnolence, bleeding, or neurologic sequela. The patient is tolerating medications without difficulties and is otherwise without complaint today.    Atrial Fibrillation Risk Factors:  he does not have symptoms  or diagnosis of sleep apnea. he does not have a history of rheumatic fever.   he has a BMI of Body mass index is 27.26 kg/m.Samuel Watkins Filed Weights   12/14/23 0907  Weight: 93.7 kg     Family History  Problem Relation Age of Onset   Throat cancer Brother    Heart disease Father    Heart disease Mother     Atrial Fibrillation Management history:  Previous antiarrhythmic drugs: amiodarone  Previous cardioversions: 02/2022 Previous ablations: 10/06/22 Anticoagulation history: Eliquis    Past Medical History:  Diagnosis Date   CAD (coronary artery disease)    CKD (chronic kidney disease)    COPD (chronic obstructive pulmonary disease) (HCC)    Diabetes mellitus without complication (HCC)    Hyperlipemia    Hypertension    Myocardial infarction (HCC)    OSA on CPAP    Peripheral neuropathy    Past Surgical History:  Procedure Laterality Date   ABDOMINAL AORTIC ANEURYSM REPAIR     ATRIAL FIBRILLATION ABLATION N/A 10/06/2022   Procedure: ATRIAL FIBRILLATION ABLATION;  Surgeon: Efraim Grange, MD;  Location: [facility] INVASIVE CV LAB;  Service: Cardiovascular;  Laterality: N/A;   CARDIOVERSION N/A 02/14/2022   Procedure: CARDIOVERSION;  Surgeon: Lenise Quince, MD;  Location: [facility] ENDOSCOPY;  Service: Cardiovascular;  Laterality: N/A;   CARDIOVERSION N/A 11/12/2022   Procedure: CARDIOVERSION;  Surgeon: Luana Rumple, MD;  Location: [facility] INVASIVE CV LAB;  Service: Cardiovascular;  Laterality: N/A;   LUMBAR DISC SURGERY  1997   LUNG SURGERY  found spot on lung 10 years ago    Current Outpatient Medications  Medication Sig Dispense Refill   acetaminophen  (TYLENOL ) 500 MG tablet Take 1,000 mg by mouth every 6 (six) hours as needed for moderate pain.     albuterol  (PROVENTIL  HFA;VENTOLIN  HFA) 108 (90 BASE) MCG/ACT inhaler Inhale 2 puffs into the lungs 4 (four) times daily as needed for wheezing or shortness of breath.     Alpha-Lipoic Acid 600 MG CAPS Take 1 capsule by mouth every  morning.     apixaban  (ELIQUIS ) 5 MG TABS tablet Take 1 tablet (5 mg total) by mouth 2 (two) times daily. 180 tablet 3   atorvastatin (LIPITOR) 80 MG tablet Take 80 mg by mouth at bedtime.     Cholecalciferol (VITAMIN D3) 5000 units CAPS Take 5,000 Units by mouth daily.     empagliflozin  (JARDIANCE ) 25 MG TABS tablet Take 12.5 mg by mouth daily.     gabapentin (NEURONTIN) 300 MG capsule Take 300 mg by mouth at bedtime.     glimepiride (AMARYL) 2 MG tablet Take 2 mg by mouth daily with breakfast.     glucose blood (ACCU-CHEK SMARTVIEW) test strip See admin instructions.     losartan -hydrochlorothiazide  (HYZAAR) 50-12.5 MG tablet Take 1 tablet by mouth daily. 90 tablet 3   MAGNESIUM PO Take 2 tablets by mouth See admin instructions. Take 2 tablets daily except skip dose on Sundays     metFORMIN (GLUCOPHAGE-XR) 500 MG 24 hr tablet Take 500 mg by mouth 2 (two) times daily.     Multiple Vitamins-Minerals (PRESERVISION AREDS PO) Take 1 tablet by mouth 2 (two) times daily.     nitroGLYCERIN (NITROSTAT) 0.4 MG SL tablet Place 0.4 mg under the tongue every 5 (five) minutes x 3 doses as needed for chest pain (Call 911 at 3rd dose within 15 minutes.).     pantoprazole (PROTONIX) 40 MG tablet TAKE ONE TABLET BY MOUTH DAILY (TAKE ON AN EMPTY STOMACH 30 MINUTES PRIOR TO A MEAL)     pioglitazone (ACTOS) 30 MG tablet Take 15 mg by mouth daily.     Semaglutide,0.25 or 0.5MG /DOS, 2 MG/3ML SOPN Inject 0.5 mg as directed once a week. Takes on Mondays     Tiotropium Bromide-Olodaterol (STIOLTO RESPIMAT) 2.5-2.5 MCG/ACT AERS Inhale 2 each into the lungs daily.     No current facility-administered medications for this encounter.    No Known Allergies  ROS- All systems are reviewed and negative except as per the HPI above.  Physical Exam: Vitals:   12/14/23 0907  BP: 118/66  Pulse: 64  Weight: 93.7 kg  Height: 6\' 1"  (1.854 m)    GEN- The patient is well appearing, alert and oriented x 3 today.   Neck - no  JVD or carotid bruit noted Lungs- Clear to ausculation bilaterally, normal work of breathing Heart- Regular rate and rhythm, no murmurs, rubs or gallops, PMI not laterally displaced Extremities- no clubbing, cyanosis, or edema Skin - no rash or ecchymosis noted   Wt Readings from Last 3 Encounters:  12/14/23 93.7 kg  10/06/23 94.1 kg  09/14/23 91.9 kg    EKG today demonstrates  Vent. rate 64 BPM PR interval 198 ms QRS duration 160 ms QT/QTcB 450/464 ms P-R-T axes 73 139 54 Normal sinus rhythm Right bundle branch block Abnormal ECG When compared with ECG of 06-Oct-2023 09:46, No significant change was found  Echo 09/23/21 demonstrated  1. Left ventricular ejection fraction, by estimation, is 60 to 65%. Left  ventricular ejection fraction by 3D volume is 61 %. The left ventricle has  normal function. The left ventricle has no regional wall motion  abnormalities. Left ventricular diastolic parameters are consistent with Grade I diastolic dysfunction (impaired relaxation).   2. Right ventricular systolic function is normal. The right ventricular  size is normal.   3. Left atrial size was mildly dilated.   4. The mitral valve is grossly normal. Trivial mitral valve  regurgitation.   5. The aortic valve is tricuspid. Aortic valve regurgitation is not  visualized.   6. Aortic dilatation noted. There is mild dilatation of the ascending  aorta, measuring 40 mm.   7. The inferior vena cava is normal in size with greater than 50%  respiratory variability, suggesting right atrial pressure of 3 mmHg.    Epic records are reviewed at length today.  CHA2DS2-VASc Score = 6  The patient's score is based upon: CHF History: 1 HTN History: 1 Diabetes History: 1 Stroke History: 0 Vascular Disease History: 1 Age Score: 2 Gender Score: 0    ASSESSMENT AND PLAN: 1. Persistent Atrial Fibrillation (ICD10:  I48.19) The patient's CHA2DS2-VASc score is  6, indicating a 9.77.2% annual risk  of stroke.   S/p afib ablation 10/06/22 Patient seen by Dr. Arlester Ladd on 6/27 and started on amiodarone  for ERAF and PVCs.   He is currently in NSR.   2. Secondary Hypercoagulable State (ICD10:  D68.69) The patient is at significant risk for stroke/thromboembolism based upon his CHA2DS2-VASc Score of 5 6.  Continue Apixaban  (Eliquis ).   3. CAD S/p multiple DES No anginal symptoms.   4. HTN Stable today.     Follow up 6 months Afib clinic.   Minnie Amber, PA-C Afib Clinic [facility] [address] [place], [place] [address] [phone] 12/14/2023 9:33 AM

## 2023-12-16 ENCOUNTER — Ambulatory Visit (HOSPITAL_COMMUNITY): Payer: Medicare HMO | Admitting: Internal Medicine

## 2024-01-14 DIAGNOSIS — E559 Vitamin D deficiency, unspecified: Secondary | ICD-10-CM | POA: Diagnosis not present

## 2024-01-14 DIAGNOSIS — E538 Deficiency of other specified B group vitamins: Secondary | ICD-10-CM | POA: Diagnosis not present

## 2024-01-14 DIAGNOSIS — I480 Paroxysmal atrial fibrillation: Secondary | ICD-10-CM | POA: Diagnosis not present

## 2024-01-14 DIAGNOSIS — I10 Essential (primary) hypertension: Secondary | ICD-10-CM | POA: Diagnosis not present

## 2024-01-14 DIAGNOSIS — E1142 Type 2 diabetes mellitus with diabetic polyneuropathy: Secondary | ICD-10-CM | POA: Diagnosis not present

## 2024-01-14 DIAGNOSIS — N1831 Chronic kidney disease, stage 3a: Secondary | ICD-10-CM | POA: Diagnosis not present

## 2024-01-14 DIAGNOSIS — E1165 Type 2 diabetes mellitus with hyperglycemia: Secondary | ICD-10-CM | POA: Diagnosis not present

## 2024-01-14 DIAGNOSIS — I739 Peripheral vascular disease, unspecified: Secondary | ICD-10-CM | POA: Diagnosis not present

## 2024-01-14 DIAGNOSIS — I2510 Atherosclerotic heart disease of native coronary artery without angina pectoris: Secondary | ICD-10-CM | POA: Diagnosis not present

## 2024-01-18 ENCOUNTER — Other Ambulatory Visit: Payer: Self-pay | Admitting: Internal Medicine

## 2024-01-18 DIAGNOSIS — I739 Peripheral vascular disease, unspecified: Secondary | ICD-10-CM

## 2024-01-19 ENCOUNTER — Inpatient Hospital Stay
Admission: RE | Admit: 2024-01-19 | Discharge: 2024-01-19 | Discharge status: Home / Self Care | Source: Ambulatory Visit | Attending: Internal Medicine | Admitting: Internal Medicine

## 2024-01-19 DIAGNOSIS — I739 Peripheral vascular disease, unspecified: Secondary | ICD-10-CM

## 2024-01-25 DIAGNOSIS — R42 Dizziness and giddiness: Secondary | ICD-10-CM | POA: Diagnosis not present

## 2024-01-25 DIAGNOSIS — I739 Peripheral vascular disease, unspecified: Secondary | ICD-10-CM | POA: Diagnosis not present

## 2024-01-25 DIAGNOSIS — I480 Paroxysmal atrial fibrillation: Secondary | ICD-10-CM | POA: Diagnosis not present

## 2024-01-25 DIAGNOSIS — Z6827 Body mass index (BMI) 27.0-27.9, adult: Secondary | ICD-10-CM | POA: Diagnosis not present

## 2024-02-01 ENCOUNTER — Ambulatory Visit (HOSPITAL_COMMUNITY)
Admission: RE | Admit: 2024-02-01 | Discharge: 2024-02-01 | Disposition: A | Discharge status: Home / Self Care | Source: Ambulatory Visit | Attending: Physician Assistant | Admitting: Physician Assistant

## 2024-02-01 VITALS — BP 100/60 | HR 54 | Ht 73.0 in | Wt 205.6 lb

## 2024-02-01 DIAGNOSIS — I4891 Unspecified atrial fibrillation: Secondary | ICD-10-CM

## 2024-02-01 DIAGNOSIS — D6869 Other thrombophilia: Secondary | ICD-10-CM | POA: Diagnosis not present

## 2024-02-01 DIAGNOSIS — I4819 Other persistent atrial fibrillation: Secondary | ICD-10-CM

## 2024-02-01 NOTE — Progress Notes (Signed)
 Primary Care Physician: Okey Carlin Redbird, MD Primary Cardiologist: Dr Jeffrie Primary Electrophysiologist: Dr Nancey Referring Physician: Dr Nancey Samuel Watkins is a 78 y.o. male with a history of CAD, PAD, DM, COPD, HTN, HLD, AAA s/p repair, atrial fibrillation who presents for follow up in the [facility]. Samuel Watkins underwent DC cardioversion in August 2023 and had early recurrence of AF. Samuel Watkins reports that Samuel Watkins is very symptomatic with atrial fibrillation with fatigue and SOB with exertion. Samuel Watkins underwent afib ablation with Dr Nancey on 10/06/22. Samuel Watkins is on Eliquis  for a CHADS2VASC score of 5.  On follow up today, Samuel Watkins reports that Samuel Watkins has not felt much different post ablation. Samuel Watkins is in rate controlled afib today with symptoms of fatigue, legs feel heavy. Samuel Watkins denies chest pain, swallowing pain, or groin issues.   On follow up 01/26/23, Samuel Watkins is currently in NSR. Taking amiodarone  as of 6/27 OV with Dr. Nancey due to [facility] and PVCs. Samuel Watkins feels less nauseous since transition to once daily over the weekend. Samuel Watkins thought Samuel Watkins was still in Afib due to feeling like having no energy. Dr. Marko plan is to reassess in 6 months for consideration of repeat ablation. No missed doses of Eliquis .  On follow up 04/20/23, Samuel Watkins is currently in NSR. Taking amiodarone  200 mg daily. Samuel Watkins has no bleeding issues on Eliquis . No episodes of Afib per Samuel Watkins.   On follow up 12/14/23, Samuel Watkins is currently in NSR. Samuel Watkins was on amiodarone  previously but no longer on it. Samuel Watkins has had no episodes of Afib since last office visit.   Follow up 02/01/24. Samuel Watkins returns for follow up for atrial fibrillation. Samuel Watkins seen by his PCP recently and though to be back in afib. Samuel Watkins was asymptomatic. ECG was not done at that time. Samuel Watkins is in SR today. No bleeding issues on anticoagulation. Samuel Watkins does have claudication symptoms when walking and is scheduled to see Dr Court for evaluation.   Today, Samuel Watkins  denies symptoms of palpitations,  chest pain, shortness of breath, orthopnea, PND, lower extremity edema, dizziness, presyncope, syncope, snoring, daytime somnolence, bleeding, or neurologic sequela. The Samuel Watkins is tolerating medications without difficulties and is otherwise without complaint today.    Atrial Fibrillation Risk Factors:  Samuel Watkins does not have symptoms or diagnosis of sleep apnea. Samuel Watkins does not have a history of rheumatic fever.   Atrial Fibrillation Management history:  Previous antiarrhythmic drugs: amiodarone  Previous cardioversions: 02/2022 Previous ablations: 10/06/22 Anticoagulation history: Eliquis    Past Medical History:  Diagnosis Date   CAD (coronary artery disease)    CKD (chronic kidney disease)    COPD (chronic obstructive pulmonary disease) (HCC)    Diabetes mellitus without complication (HCC)    Hyperlipemia    Hypertension    Myocardial infarction (HCC)    OSA on CPAP    Peripheral neuropathy     Current Outpatient Medications  Medication Sig Dispense Refill   acetaminophen  (TYLENOL ) 500 MG tablet Take 1,000 mg by mouth every 6 (six) hours as needed for moderate pain. (Samuel Watkins taking differently: Take 1,000 mg by mouth as needed for moderate pain (pain score 4-6).)     albuterol  (PROVENTIL  HFA;VENTOLIN  HFA) 108 (90 BASE) MCG/ACT inhaler Inhale 2 puffs into the lungs 4 (four) times daily as needed for wheezing or shortness of breath.     Alpha-Lipoic Acid 600 MG CAPS Take 1 capsule by mouth every morning.     apixaban  (ELIQUIS ) 5 MG TABS tablet Take 1 tablet (  5 mg total) by mouth 2 (two) times daily. 180 tablet 3   atorvastatin (LIPITOR) 80 MG tablet Take 80 mg by mouth at bedtime.     Cholecalciferol (VITAMIN D3) 5000 units CAPS Take 5,000 Units by mouth daily.     empagliflozin  (JARDIANCE ) 25 MG TABS tablet Take 12.5 mg by mouth daily.     gabapentin (NEURONTIN) 300 MG capsule Take 300 mg by mouth at bedtime.     glimepiride (AMARYL) 2 MG tablet Take 2 mg by mouth daily with breakfast.      glucose blood (ACCU-CHEK SMARTVIEW) test strip See admin instructions.     losartan -hydrochlorothiazide  (HYZAAR) 50-12.5 MG tablet Take 1 tablet by mouth daily. 90 tablet 3   MAGNESIUM PO Take 2 tablets by mouth See admin instructions. Take 2 tablets daily except skip dose on Sundays     metFORMIN (GLUCOPHAGE-XR) 500 MG 24 hr tablet Take 500 mg by mouth 2 (two) times daily.     Multiple Vitamins-Minerals (PRESERVISION AREDS PO) Take 1 tablet by mouth 2 (two) times daily.     nitroGLYCERIN (NITROSTAT) 0.4 MG SL tablet Place 0.4 mg under the tongue every 5 (five) minutes x 3 doses as needed for chest pain (Call 911 at 3rd dose within 15 minutes.).     pantoprazole (PROTONIX) 40 MG tablet TAKE ONE TABLET BY MOUTH DAILY (TAKE ON AN EMPTY STOMACH 30 MINUTES PRIOR TO A MEAL)     pioglitazone (ACTOS) 30 MG tablet Take 15 mg by mouth daily.     Semaglutide,0.25 or 0.5MG /DOS, 2 MG/3ML SOPN Inject 0.5 mg as directed once a week. Takes on Mondays     Tiotropium Bromide-Olodaterol (STIOLTO RESPIMAT) 2.5-2.5 MCG/ACT AERS Inhale 2 each into the lungs daily.     No current facility-administered medications for this encounter.    No Known Allergies  ROS- All systems are reviewed and negative except as per the HPI above.  Physical Exam: Vitals:   02/01/24 0856  BP: 100/60  Pulse: (!) 54  Weight: 93.3 kg  Height: 6' 1 (1.854 m)     GEN: Well nourished, well developed in no acute distress CARDIAC: Regular rate and rhythm, no murmurs, rubs, gallops RESPIRATORY:  Clear to auscultation without rales, wheezing or rhonchi  ABDOMEN: Soft, non-tender, non-distended EXTREMITIES:  No edema; No deformity    Wt Readings from Last 3 Encounters:  02/01/24 93.3 kg  12/14/23 93.7 kg  10/06/23 94.1 kg    EKG today demonstrates  SB, RBBB Vent. rate 54 BPM PR interval 198 ms QRS duration 158 ms QT/QTcB 464/440 ms   Echo 09/23/21 demonstrated  1. Left ventricular ejection fraction, by estimation, is  60 to 65%. Left  ventricular ejection fraction by 3D volume is 61 %. The left ventricle has  normal function. The left ventricle has no regional wall motion  abnormalities. Left ventricular diastolic parameters are consistent with Grade I diastolic dysfunction (impaired relaxation).   2. Right ventricular systolic function is normal. The right ventricular  size is normal.   3. Left atrial size was mildly dilated.   4. The mitral valve is grossly normal. Trivial mitral valve  regurgitation.   5. The aortic valve is tricuspid. Aortic valve regurgitation is not  visualized.   6. Aortic dilatation noted. There is mild dilatation of the ascending  aorta, measuring 40 mm.   7. The inferior vena cava is normal in size with greater than 50%  respiratory variability, suggesting right atrial pressure of 3 mmHg.  Epic records are reviewed at length today.  CHA2DS2-VASc Score = 6  The Samuel Watkins's score is based upon: CHF History: 1 HTN History: 1 Diabetes History: 1 Stroke History: 0 Vascular Disease History: 1 Age Score: 2 Gender Score: 0       ASSESSMENT AND PLAN: Persistent Atrial Fibrillation (ICD10:  I48.19) The Samuel Watkins's CHA2DS2-VASc score is 6, indicating a 9.7% annual risk of stroke.   S/p afib ablation 10/06/22, now off amiodarone  Samuel Watkins in SR today, unclear if Samuel Watkins was in afib at his PCP office, Samuel Watkins was asymptomatic at that time. We discussed long term monitoring strategies including [employer] mobile.  Continue Eliquis  5 mg BID  Secondary Hypercoagulable State (ICD10:  D68.69) The Samuel Watkins is at significant risk for stroke/thromboembolism based upon his CHA2DS2-VASc Score of 6.  Continue Apixaban  (Eliquis ). No bleeding issues.   CAD S/p Multiple DES No anginal symptoms Followed by Dr Jeffrie  HTN Stable on current regimen    Follow up with Dr Court and Dr Nancey as scheduled.         Daril Kicks PA-C Afib Clinic [facility] [address] [place], [place] [address] [phone] 02/01/2024 9:38 AM

## 2024-02-08 ENCOUNTER — Encounter: Payer: Self-pay | Admitting: Cardiovascular Disease

## 2024-02-08 ENCOUNTER — Ambulatory Visit: Attending: Cardiovascular Disease | Admitting: Cardiovascular Disease

## 2024-02-08 VITALS — BP 110/64 | HR 73 | Ht 73.0 in | Wt 208.6 lb

## 2024-02-08 DIAGNOSIS — I739 Peripheral vascular disease, unspecified: Secondary | ICD-10-CM | POA: Insufficient documentation

## 2024-02-08 MED ORDER — CILOSTAZOL 50 MG PO TABS: 50.0000 mg | ORAL_TABLET | Freq: Two times a day (BID) | ORAL | 1 refills | Status: DC

## 2024-02-08 NOTE — Assessment & Plan Note (Signed)
 Samuel Watkins was referred to me by Dr. Okey for symptomatic PAD.  He does have a history of CAD.  He had lower extremity arterial Doppler studies performed 06/01/2020 revealing a right ABI of 0.99 and a left of 0.77.  His most recent Doppler studies performed 01/19/2024 revealed a right ABI of 0.84 and a left of 0.86.  He does complain of lifestyle-limiting claudication in his hip and his calves at less than 1 block and walking up a flight of stairs.  I am going to get aortoiliac and lower extremity arterial Doppler studies and begin him on Pletal  50 mg p.o. twice daily.  I will see him back in 3 months.  If he does not have significant clinical benefit we will talk about possible endovascular approach depending on the results of his Doppler studies.

## 2024-02-08 NOTE — Patient Instructions (Signed)
 Medication Instructions:  Your physician has recommended you make the following change in your medication:   -Start cilostazol  (pletal ) 50mg  twice daily.  *If you need a refill on your cardiac medications before your next appointment, please call your pharmacy*    Testing/Procedures: Your physician has requested that you have an Aorta/Iliac Duplex. This will take place at [address], 4th floor  No food after 11PM the night before.  Water is OK. (Don't drink liquids if you have been instructed not to for ANOTHER test) Avoid foods that produce bowel gas, for 24 hours prior to exam (see below). No breakfast, no chewing gum, no smoking or carbonated beverages. Patient may take morning medications with water. Come in for test at least 15 minutes early to register.  Please note: We ask at that you not bring children with you during ultrasound (echo/ vascular) testing. Due to room size and safety concerns, children are not allowed in the ultrasound rooms during exams. Our front office staff cannot provide observation of children in our lobby area while testing is being conducted. An adult accompanying a patient to their appointment will only be allowed in the ultrasound room at the discretion of the ultrasound technician under special circumstances. We apologize for any inconvenience.   Your physician has requested that you have a lower extremity arterial duplex. During this test, ultrasound is used to evaluate arterial blood flow in the legs. Allow one hour for this exam. There are no restrictions or special instructions. This will take place at [address], 4th floor  Please note: We ask at that you not bring children with you during ultrasound (echo/ vascular) testing. Due to room size and safety concerns, children are not allowed in the ultrasound rooms during exams. Our front office staff cannot provide observation of children in our lobby area while testing is being conducted. An  adult accompanying a patient to their appointment will only be allowed in the ultrasound room at the discretion of the ultrasound technician under special circumstances. We apologize for any inconvenience.   Follow-Up: At [facility], you and your health needs are our priority.  As part of our continuing mission to provide you with exceptional heart care, our providers are all part of one team.  This team includes your primary Cardiologist (physician) and Advanced Practice Providers or APPs (Physician Assistants and Nurse Practitioners) who all work together to provide you with the care you need, when you need it.  Your next appointment:   3 month(s)  Provider:   Dorn Lesches, MD

## 2024-02-08 NOTE — Progress Notes (Signed)
 02/08/2024 [name]   24-Apr-1946  [id number]  Primary Physician Okey Carlin Redbird, MD Primary Cardiologist: Dorn JINNY Lesches MD [facility], [place], FSCAI  HPI:  DILLYN MENNA is a 78 y.o.  mildly overweight married Caucasian male father of 3 children, grandfather of 5 grandchildren referred by Dr. Okey, his PCP, for evaluation of PAD.  He is a cardiology patient of Dr. Jeffrie and an EP patient of Dr. Marko.  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  History factors include discontinue tobacco abuse 15 years ago, treated hypertension, diabetes and hyperlipidemia as well as family history of heart disease with a father who had heart attacks.  He has had coronary stenting in 2001 and 2008.  He is also had A-fib ablation by Dr. Nancey 10/06/2022.  He has obstructive sleep apnea on CPAP.  He is complaining of hip pain for years as well as some calf pain.  He is most recent Dopplers revealed ABIs in the 0.8 range bilaterally.   Current Meds  Medication Sig   acetaminophen  (TYLENOL ) 500 MG tablet Take 1,000 mg by mouth every 6 (six) hours as needed for moderate pain. (Patient taking differently: Take 1,000 mg by mouth as needed for moderate pain (pain score 4-6).)   albuterol  (PROVENTIL  HFA;VENTOLIN  HFA) 108 (90 BASE) MCG/ACT inhaler Inhale 2 puffs into the lungs 4 (four) times daily as needed for wheezing or shortness of breath.   Alpha-Lipoic Acid 600 MG CAPS Take 1 capsule by mouth every morning.   apixaban  (ELIQUIS ) 5 MG TABS tablet Take 1 tablet (5 mg total) by mouth 2 (two) times daily.   atorvastatin (LIPITOR) 80 MG tablet Take 80 mg by mouth at bedtime.   Cholecalciferol (VITAMIN D3) 5000 units CAPS Take 5,000 Units by mouth daily.   cilostazol  (PLETAL ) 50 MG tablet Take 1 tablet (50 mg total) by mouth 2 (two) times daily.   empagliflozin  (JARDIANCE ) 25 MG TABS tablet Take 12.5 mg by mouth daily.   gabapentin (NEURONTIN) 300 MG capsule Take 300 mg by mouth  at bedtime.   glimepiride (AMARYL) 2 MG tablet Take 2 mg by mouth daily with breakfast.   glucose blood (ACCU-CHEK SMARTVIEW) test strip See admin instructions.   losartan -hydrochlorothiazide  (HYZAAR) 50-12.5 MG tablet Take 1 tablet by mouth daily.   MAGNESIUM PO Take 2 tablets by mouth See admin instructions. Take 2 tablets daily except skip dose on Sundays   metFORMIN (GLUCOPHAGE-XR) 500 MG 24 hr tablet Take 500 mg by mouth 2 (two) times daily.   Multiple Vitamins-Minerals (PRESERVISION AREDS PO) Take 1 tablet by mouth 2 (two) times daily.   nitroGLYCERIN (NITROSTAT) 0.4 MG SL tablet Place 0.4 mg under the tongue every 5 (five) minutes x 3 doses as needed for chest pain (Call 911 at 3rd dose within 15 minutes.).   pantoprazole (PROTONIX) 40 MG tablet TAKE ONE TABLET BY MOUTH DAILY (TAKE ON AN EMPTY STOMACH 30 MINUTES PRIOR TO A MEAL)   pioglitazone (ACTOS) 30 MG tablet Take 15 mg by mouth daily.   Semaglutide,0.25 or 0.5MG /DOS, 2 MG/3ML SOPN Inject 0.5 mg as directed once a week. Takes on Mondays   Tiotropium Bromide-Olodaterol (STIOLTO RESPIMAT) 2.5-2.5 MCG/ACT AERS Inhale 2 each into the lungs daily.     No Known Allergies  Social History   Socioeconomic History   Marital status: Married    Spouse name: Rojelio   Number of children: 3   Years of education: 35  Highest education level: Not on file  Occupational History   Occupation: retired  Tobacco Use   Smoking status: Former    Current packs/day: 0.00    Average packs/day: 1 pack/day for 50.0 years (50.0 ttl pk-yrs)    Types: Cigarettes    Start date: 07/14/1960    Quit date: 07/14/2010    Years since quitting: 13.5   Smokeless tobacco: Never   Tobacco comments:    Former smoker 11/03/22  Vaping Use   Vaping status: Never Used  Substance and Sexual Activity   Alcohol use: No    Alcohol/week: 0.0 standard drinks of alcohol   Drug use: No   Sexual activity: Not on file  Other Topics Concern   Not on file  Social History  Narrative   Lives with wife   Caffeine- coffee 2 c, soda 1, tea 1   Social Drivers of Corporate investment banker Strain: Not on file  Food Insecurity: Not on file  Transportation Needs: Not on file  Physical Activity: Not on file  Stress: Not on file  Social Connections: Not on file  Intimate Partner Violence: Not on file     Review of Systems: General: negative for chills, fever, night sweats or weight changes.  Cardiovascular: negative for chest pain, dyspnea on exertion, edema, orthopnea, palpitations, paroxysmal nocturnal dyspnea or shortness of breath Dermatological: negative for rash Respiratory: negative for cough or wheezing Urologic: negative for hematuria Abdominal: negative for nausea, vomiting, diarrhea, bright red blood per rectum, melena, or hematemesis Neurologic: negative for visual changes, syncope, or dizziness All other systems reviewed and are otherwise negative except as noted above.    Blood pressure 110/64, pulse 73, height 6' 1 (1.854 m), weight 208 lb 9.6 oz (94.6 kg), SpO2 92%.  General appearance: alert and no distress Neck: no adenopathy, no carotid bruit, no JVD, supple, symmetrical, trachea midline, and thyroid  not enlarged, symmetric, no tenderness/mass/nodules Lungs: clear to auscultation bilaterally Heart: regular rate and rhythm, S1, S2 normal, no murmur, click, rub or gallop Extremities: extremities normal, atraumatic, no cyanosis or edema Pulses:   Skin: Skin color, texture, turgor normal. No rashes or lesions Neurologic: Grossly normal  EKG not performed today      ASSESSMENT AND PLAN:   Peripheral arterial disease [facility]) Mr. Tomaszewski was referred to me by Dr. Okey for symptomatic PAD.  He does have a history of CAD.  He had lower extremity arterial Doppler studies performed 06/01/2020 revealing a right ABI of 0.99 and a left of 0.77.  His most recent Doppler studies performed 01/19/2024 revealed a right ABI of 0.84 and a left of 0.86.  He  does complain of lifestyle-limiting claudication in his hip and his calves at less than 1 block and walking up a flight of stairs.  I am going to get aortoiliac and lower extremity arterial Doppler studies and begin him on Pletal  50 mg p.o. twice daily.  I will see him back in 3 months.  If he does not have significant clinical benefit we will talk about possible endovascular approach depending on the results of his Doppler studies.     Dorn DOROTHA Lesches MD FACP,FACC,FAHA, [facility] 02/08/2024 2:10 PM

## 2024-02-15 ENCOUNTER — Other Ambulatory Visit: Payer: Self-pay | Admitting: Family Medicine

## 2024-02-15 DIAGNOSIS — R918 Other nonspecific abnormal finding of lung field: Secondary | ICD-10-CM

## 2024-03-21 ENCOUNTER — Ambulatory Visit: Payer: Self-pay | Admitting: Cardiovascular Disease

## 2024-03-21 ENCOUNTER — Ambulatory Visit (HOSPITAL_COMMUNITY)
Admission: RE | Admit: 2024-03-21 | Discharge: 2024-03-21 | Disposition: A | Discharge status: Home / Self Care | Source: Ambulatory Visit | Attending: Cardiovascular Disease | Admitting: Cardiovascular Disease

## 2024-03-21 ENCOUNTER — Ambulatory Visit (HOSPITAL_BASED_OUTPATIENT_CLINIC_OR_DEPARTMENT_OTHER)
Admission: RE | Admit: 2024-03-21 | Discharge: 2024-03-21 | Disposition: A | Discharge status: Home / Self Care | Source: Ambulatory Visit | Attending: Cardiovascular Disease | Admitting: Cardiovascular Disease

## 2024-03-21 DIAGNOSIS — I739 Peripheral vascular disease, unspecified: Secondary | ICD-10-CM | POA: Diagnosis not present

## 2024-03-28 ENCOUNTER — Encounter: Payer: Self-pay | Admitting: Cardiovascular Disease

## 2024-03-28 ENCOUNTER — Ambulatory Visit: Attending: Cardiovascular Disease | Admitting: Cardiovascular Disease

## 2024-03-28 VITALS — BP 104/68 | HR 84 | Ht 73.0 in | Wt 203.4 lb

## 2024-03-28 DIAGNOSIS — I739 Peripheral vascular disease, unspecified: Secondary | ICD-10-CM

## 2024-03-28 NOTE — H&P (View-Only) (Signed)
 03/28/2024 Samuel Watkins Gravely   01-Apr-1946  [id number]  Primary Physician Okey Carlin Redbird, MD Primary Cardiologist: Dorn JINNY Lesches MD [facility], [place], FSCAI  HPI:  Samuel Watkins is a 78 y.o.   mildly overweight married Caucasian male father of 3 children, grandfather of 5 grandchildren referred by Dr. Okey, his PCP, for evaluation of PAD.  He is a cardiology patient of Dr. Jeffrie and an EP patient of Dr. Marko.  I last saw him in the office 02/08/2024.  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  History factors include discontinue tobacco abuse 15 years ago, treated hypertension, diabetes and hyperlipidemia as well as family history of heart disease with a father who had heart attacks.  He has had coronary stenting in 2001 and 2008.  He is also had A-fib ablation by Dr. Nancey 10/06/2022.  He has obstructive sleep apnea on CPAP.  He is complaining of hip pain for years as well as some calf pain.  He is most recent Dopplers revealed ABIs in the 0.8 range bilaterally.   Since I saw him in the office 2 months ago I did begin him on Pletal  empirically which did not provide him any clinical benefit.  I did get Dopplers on him 03/21/2024 that revealed ABIs in the 0.85 range bilaterally with high-frequency signals in both SFAs.  His iliac Dopplers did not show iliac disease.  He wishes to proceed with outpatient peripheral angiography and endovascular therapy for lifestyle-limiting claudication.  Current Meds  Medication Sig   acetaminophen  (TYLENOL ) 500 MG tablet Take 1,000 mg by mouth every 6 (six) hours as needed for moderate pain. (Patient taking differently: Take 1,000 mg by mouth as needed for moderate pain (pain score 4-6).)   albuterol  (PROVENTIL  HFA;VENTOLIN  HFA) 108 (90 BASE) MCG/ACT inhaler Inhale 2 puffs into the lungs 4 (four) times daily as needed for wheezing or shortness of breath.   Alpha-Lipoic Acid 600 MG CAPS Take 1 capsule by mouth every morning.    apixaban  (ELIQUIS ) 5 MG TABS tablet Take 1 tablet (5 mg total) by mouth 2 (two) times daily.   atorvastatin (LIPITOR) 80 MG tablet Take 80 mg by mouth at bedtime.   Cholecalciferol (VITAMIN D3) 5000 units CAPS Take 5,000 Units by mouth daily.   cilostazol  (PLETAL ) 50 MG tablet Take 1 tablet (50 mg total) by mouth 2 (two) times daily.   empagliflozin  (JARDIANCE ) 25 MG TABS tablet Take 12.5 mg by mouth daily.   gabapentin (NEURONTIN) 300 MG capsule Take 300 mg by mouth at bedtime.   glimepiride (AMARYL) 2 MG tablet Take 2 mg by mouth daily with breakfast.   glucose blood (ACCU-CHEK SMARTVIEW) test strip See admin instructions.   losartan -hydrochlorothiazide  (HYZAAR) 50-12.5 MG tablet Take 1 tablet by mouth daily.   MAGNESIUM PO Take 2 tablets by mouth See admin instructions. Take 2 tablets daily except skip dose on Sundays   metFORMIN (GLUCOPHAGE-XR) 500 MG 24 hr tablet Take 500 mg by mouth 2 (two) times daily.   Multiple Vitamins-Minerals (PRESERVISION AREDS PO) Take 1 tablet by mouth 2 (two) times daily.   nitroGLYCERIN (NITROSTAT) 0.4 MG SL tablet Place 0.4 mg under the tongue every 5 (five) minutes x 3 doses as needed for chest pain (Call 911 at 3rd dose within 15 minutes.).   pantoprazole (PROTONIX) 40 MG tablet TAKE ONE TABLET BY MOUTH DAILY (TAKE ON AN EMPTY STOMACH 30 MINUTES PRIOR TO A MEAL)   pioglitazone (  ACTOS) 30 MG tablet Take 15 mg by mouth daily.   Semaglutide,0.25 or 0.5MG /DOS, 2 MG/3ML SOPN Inject 0.5 mg as directed once a week. Takes on Mondays   Tiotropium Bromide-Olodaterol (STIOLTO RESPIMAT) 2.5-2.5 MCG/ACT AERS Inhale 2 each into the lungs daily.     No Known Allergies  Social History   Socioeconomic History   Marital status: Married    Spouse name: Rojelio   Number of children: 3   Years of education: 12   Highest education level: Not on file  Occupational History   Occupation: retired  Tobacco Use   Smoking status: Former    Current packs/day: 0.00    Average  packs/day: 1 pack/day for 50.0 years (50.0 ttl pk-yrs)    Types: Cigarettes    Start date: 07/14/1960    Quit date: 07/14/2010    Years since quitting: 13.7   Smokeless tobacco: Never   Tobacco comments:    Former smoker 11/03/22  Vaping Use   Vaping status: Never Used  Substance and Sexual Activity   Alcohol use: No    Alcohol/week: 0.0 standard drinks of alcohol   Drug use: No   Sexual activity: Not on file  Other Topics Concern   Not on file  Social History Narrative   Lives with wife   Caffeine- coffee 2 c, soda 1, tea 1   Social Drivers of Corporate investment banker Strain: Not on file  Food Insecurity: Not on file  Transportation Needs: Not on file  Physical Activity: Not on file  Stress: Not on file  Social Connections: Not on file  Intimate Partner Violence: Not on file     Review of Systems: General: negative for chills, fever, night sweats or weight changes.  Cardiovascular: negative for chest pain, dyspnea on exertion, edema, orthopnea, palpitations, paroxysmal nocturnal dyspnea or shortness of breath Dermatological: negative for rash Respiratory: negative for cough or wheezing Urologic: negative for hematuria Abdominal: negative for nausea, vomiting, diarrhea, bright red blood per rectum, melena, or hematemesis Neurologic: negative for visual changes, syncope, or dizziness All other systems reviewed and are otherwise negative except as noted above.    Blood pressure 104/68, pulse 84, height 6' 1 (1.854 m), weight 203 lb 6.4 oz (92.3 kg), SpO2 95%.  General appearance: alert and no distress Neck: no adenopathy, no carotid bruit, no JVD, supple, symmetrical, trachea midline, and thyroid  not enlarged, symmetric, no tenderness/mass/nodules Lungs: clear to auscultation bilaterally Heart: regular rate and rhythm, S1, S2 normal, no murmur, click, rub or gallop Extremities: extremities normal, atraumatic, no cyanosis or edema Pulses: Diminished pedal pulses Skin:  Skin color, texture, turgor normal. No rashes or lesions Neurologic: Grossly normal  EKG not performed today      ASSESSMENT AND PLAN:   Peripheral arterial disease [facility]) Mr. Schultes returns today for follow-up of PAD.  I last saw him in the office 02/08/2024.  He failed empiric treatment with Pletal .  He still has lifestyle-limiting claudication.  Doppler studies performed on/8/25 revealed ABIs in the mid 0.8 range with high-frequency signals in both SFAs.  He wishes to proceed with outpatient peripheral angiography and endovascular therapy.  Will arrange to have this performed on October 9.     Dorn DOROTHA Lesches MD FACP,FACC,FAHA, FSCAI 03/28/2024 9:52 AM

## 2024-03-28 NOTE — Progress Notes (Signed)
 03/28/2024 Samuel Watkins   01-Apr-1946  [id number]  Primary Physician Okey Carlin Redbird, MD Primary Cardiologist: Dorn JINNY Lesches MD [facility], [place], FSCAI  HPI:  Samuel Watkins is a 78 y.o.   mildly overweight married Caucasian male father of 3 children, grandfather of 5 grandchildren referred by Dr. Okey, his PCP, for evaluation of PAD.  He is a cardiology patient of Dr. Jeffrie and an EP patient of Dr. Marko.  I last saw him in the office 02/08/2024.  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  History factors include discontinue tobacco abuse 15 years ago, treated hypertension, diabetes and hyperlipidemia as well as family history of heart disease with a father who had heart attacks.  He has had coronary stenting in 2001 and 2008.  He is also had A-fib ablation by Dr. Nancey 10/06/2022.  He has obstructive sleep apnea on CPAP.  He is complaining of hip pain for years as well as some calf pain.  He is most recent Dopplers revealed ABIs in the 0.8 range bilaterally.   Since I saw him in the office 2 months ago I did begin him on Pletal  empirically which did not provide him any clinical benefit.  I did get Dopplers on him 03/21/2024 that revealed ABIs in the 0.85 range bilaterally with high-frequency signals in both SFAs.  His iliac Dopplers did not show iliac disease.  He wishes to proceed with outpatient peripheral angiography and endovascular therapy for lifestyle-limiting claudication.  Current Meds  Medication Sig   acetaminophen  (TYLENOL ) 500 MG tablet Take 1,000 mg by mouth every 6 (six) hours as needed for moderate pain. (Patient taking differently: Take 1,000 mg by mouth as needed for moderate pain (pain score 4-6).)   albuterol  (PROVENTIL  HFA;VENTOLIN  HFA) 108 (90 BASE) MCG/ACT inhaler Inhale 2 puffs into the lungs 4 (four) times daily as needed for wheezing or shortness of breath.   Alpha-Lipoic Acid 600 MG CAPS Take 1 capsule by mouth every morning.    apixaban  (ELIQUIS ) 5 MG TABS tablet Take 1 tablet (5 mg total) by mouth 2 (two) times daily.   atorvastatin (LIPITOR) 80 MG tablet Take 80 mg by mouth at bedtime.   Cholecalciferol (VITAMIN D3) 5000 units CAPS Take 5,000 Units by mouth daily.   cilostazol  (PLETAL ) 50 MG tablet Take 1 tablet (50 mg total) by mouth 2 (two) times daily.   empagliflozin  (JARDIANCE ) 25 MG TABS tablet Take 12.5 mg by mouth daily.   gabapentin (NEURONTIN) 300 MG capsule Take 300 mg by mouth at bedtime.   glimepiride (AMARYL) 2 MG tablet Take 2 mg by mouth daily with breakfast.   glucose blood (ACCU-CHEK SMARTVIEW) test strip See admin instructions.   losartan -hydrochlorothiazide  (HYZAAR) 50-12.5 MG tablet Take 1 tablet by mouth daily.   MAGNESIUM PO Take 2 tablets by mouth See admin instructions. Take 2 tablets daily except skip dose on Sundays   metFORMIN (GLUCOPHAGE-XR) 500 MG 24 hr tablet Take 500 mg by mouth 2 (two) times daily.   Multiple Vitamins-Minerals (PRESERVISION AREDS PO) Take 1 tablet by mouth 2 (two) times daily.   nitroGLYCERIN (NITROSTAT) 0.4 MG SL tablet Place 0.4 mg under the tongue every 5 (five) minutes x 3 doses as needed for chest pain (Call 911 at 3rd dose within 15 minutes.).   pantoprazole (PROTONIX) 40 MG tablet TAKE ONE TABLET BY MOUTH DAILY (TAKE ON AN EMPTY STOMACH 30 MINUTES PRIOR TO A MEAL)   pioglitazone (  ACTOS) 30 MG tablet Take 15 mg by mouth daily.   Semaglutide,0.25 or 0.5MG /DOS, 2 MG/3ML SOPN Inject 0.5 mg as directed once a week. Takes on Mondays   Tiotropium Bromide-Olodaterol (STIOLTO RESPIMAT) 2.5-2.5 MCG/ACT AERS Inhale 2 each into the lungs daily.     No Known Allergies  Social History   Socioeconomic History   Marital status: Married    Spouse name: Rojelio   Number of children: 3   Years of education: 12   Highest education level: Not on file  Occupational History   Occupation: retired  Tobacco Use   Smoking status: Former    Current packs/day: 0.00    Average  packs/day: 1 pack/day for 50.0 years (50.0 ttl pk-yrs)    Types: Cigarettes    Start date: 07/14/1960    Quit date: 07/14/2010    Years since quitting: 13.7   Smokeless tobacco: Never   Tobacco comments:    Former smoker 11/03/22  Vaping Use   Vaping status: Never Used  Substance and Sexual Activity   Alcohol use: No    Alcohol/week: 0.0 standard drinks of alcohol   Drug use: No   Sexual activity: Not on file  Other Topics Concern   Not on file  Social History Narrative   Lives with wife   Caffeine- coffee 2 c, soda 1, tea 1   Social Drivers of Corporate investment banker Strain: Not on file  Food Insecurity: Not on file  Transportation Needs: Not on file  Physical Activity: Not on file  Stress: Not on file  Social Connections: Not on file  Intimate Partner Violence: Not on file     Review of Systems: General: negative for chills, fever, night sweats or weight changes.  Cardiovascular: negative for chest pain, dyspnea on exertion, edema, orthopnea, palpitations, paroxysmal nocturnal dyspnea or shortness of breath Dermatological: negative for rash Respiratory: negative for cough or wheezing Urologic: negative for hematuria Abdominal: negative for nausea, vomiting, diarrhea, bright red blood per rectum, melena, or hematemesis Neurologic: negative for visual changes, syncope, or dizziness All other systems reviewed and are otherwise negative except as noted above.    Blood pressure 104/68, pulse 84, height 6' 1 (1.854 m), weight 203 lb 6.4 oz (92.3 kg), SpO2 95%.  General appearance: alert and no distress Neck: no adenopathy, no carotid bruit, no JVD, supple, symmetrical, trachea midline, and thyroid  not enlarged, symmetric, no tenderness/mass/nodules Lungs: clear to auscultation bilaterally Heart: regular rate and rhythm, S1, S2 normal, no murmur, click, rub or gallop Extremities: extremities normal, atraumatic, no cyanosis or edema Pulses: Diminished pedal pulses Skin:  Skin color, texture, turgor normal. No rashes or lesions Neurologic: Grossly normal  EKG not performed today      ASSESSMENT AND PLAN:   Peripheral arterial disease [facility]) Mr. Schultes returns today for follow-up of PAD.  I last saw him in the office 02/08/2024.  He failed empiric treatment with Pletal .  He still has lifestyle-limiting claudication.  Doppler studies performed on/8/25 revealed ABIs in the mid 0.8 range with high-frequency signals in both SFAs.  He wishes to proceed with outpatient peripheral angiography and endovascular therapy.  Will arrange to have this performed on October 9.     Dorn DOROTHA Lesches MD FACP,FACC,FAHA, FSCAI 03/28/2024 9:52 AM

## 2024-03-28 NOTE — Assessment & Plan Note (Signed)
 Mr. Esquivias returns today for follow-up of PAD.  I last saw him in the office 02/08/2024.  He failed empiric treatment with Pletal .  He still has lifestyle-limiting claudication.  Doppler studies performed on/8/25 revealed ABIs in the mid 0.8 range with high-frequency signals in both SFAs.  He wishes to proceed with outpatient peripheral angiography and endovascular therapy.  Will arrange to have this performed on October 9.

## 2024-03-28 NOTE — Patient Instructions (Signed)
 Medication Instructions:  Your physician recommends that you continue on your current medications as directed. Please refer to the Current Medication list given to you today.  *If you need a refill on your cardiac medications before your next appointment, please call your pharmacy*   Testing/Procedures: A nurse will be in contact with you to schedule your PV procedure.    Follow-Up: At [facility], you and your health needs are our priority.  As part of our continuing mission to provide you with exceptional heart care, our providers are all part of one team.  This team includes your primary Cardiologist (physician) and Advanced Practice Providers or APPs (Physician Assistants and Nurse Practitioners) who all work together to provide you with the care you need, when you need it.  Your next appointment:   Pending   Provider:   Dorn Lesches, MD

## 2024-03-29 ENCOUNTER — Ambulatory Visit
Admission: RE | Admit: 2024-03-29 | Discharge: 2024-03-29 | Disposition: A | Discharge status: Home / Self Care | Source: Ambulatory Visit | Attending: Family Medicine | Admitting: Family Medicine

## 2024-03-29 DIAGNOSIS — R918 Other nonspecific abnormal finding of lung field: Secondary | ICD-10-CM

## 2024-03-29 DIAGNOSIS — N2889 Other specified disorders of kidney and ureter: Secondary | ICD-10-CM | POA: Diagnosis not present

## 2024-03-29 MED ORDER — GADOPICLENOL 0.5 MMOL/ML IV SOLN
10.0000 mL | Freq: Once | INTRAVENOUS | Status: AC | PRN
  Administered 2024-03-29: 10 mL via INTRAVENOUS

## 2024-04-01 ENCOUNTER — Telehealth: Payer: Self-pay

## 2024-04-01 DIAGNOSIS — I739 Peripheral vascular disease, unspecified: Secondary | ICD-10-CM

## 2024-04-01 NOTE — Telephone Encounter (Signed)
 Spoke with pt regarding PV angiogram procedure for his leg claudication. Pt is ok with 10/9 for his procedural date. Pt request letter to be mailed with all information needed for his procedure. Pt plans to have labs drawn on 10/7 when he already has an appointment to see Dr. Jeffrie. Orders placed. Will also send instructions via mychart. Pt states he will call back with any questions or concerns.

## 2024-04-04 ENCOUNTER — Encounter (HOSPITAL_COMMUNITY)

## 2024-04-13 DIAGNOSIS — N1831 Chronic kidney disease, stage 3a: Secondary | ICD-10-CM | POA: Diagnosis not present

## 2024-04-13 DIAGNOSIS — D49511 Neoplasm of unspecified behavior of right kidney: Secondary | ICD-10-CM | POA: Diagnosis not present

## 2024-04-13 DIAGNOSIS — N281 Cyst of kidney, acquired: Secondary | ICD-10-CM | POA: Diagnosis not present

## 2024-04-14 ENCOUNTER — Other Ambulatory Visit: Payer: Self-pay | Admitting: Cardiovascular Disease

## 2024-04-14 DIAGNOSIS — I739 Peripheral vascular disease, unspecified: Secondary | ICD-10-CM

## 2024-04-19 ENCOUNTER — Ambulatory Visit: Attending: Cardiology | Admitting: Cardiology

## 2024-04-19 ENCOUNTER — Encounter: Payer: Self-pay | Admitting: Cardiology

## 2024-04-19 VITALS — BP 103/66 | HR 63 | Ht 73.0 in | Wt 200.0 lb

## 2024-04-19 DIAGNOSIS — D6869 Other thrombophilia: Secondary | ICD-10-CM

## 2024-04-19 DIAGNOSIS — I25118 Atherosclerotic heart disease of native coronary artery with other forms of angina pectoris: Secondary | ICD-10-CM

## 2024-04-19 DIAGNOSIS — J438 Other emphysema: Secondary | ICD-10-CM | POA: Diagnosis not present

## 2024-04-19 DIAGNOSIS — I4819 Other persistent atrial fibrillation: Secondary | ICD-10-CM | POA: Diagnosis not present

## 2024-04-19 DIAGNOSIS — I739 Peripheral vascular disease, unspecified: Secondary | ICD-10-CM

## 2024-04-19 DIAGNOSIS — I10 Essential (primary) hypertension: Secondary | ICD-10-CM

## 2024-04-19 LAB — CBC

## 2024-04-19 NOTE — Patient Instructions (Signed)

## 2024-04-19 NOTE — Progress Notes (Signed)
 Cardiology Office Note:  .   Date:  04/19/2024  ID:  Samuel Watkins, DOB [birth year], MRN [id number] PCP: Okey Carlin Redbird, MD  [facility] Providers Cardiologist:  Oneil Parchment, MD Electrophysiologist:  Eulas FORBES Furbish, MD  PV Cardiologist:  Dorn Lesches, MD     History of Present Illness: Samuel   MANLEY Watkins is a 78 y.o. male Discussed the use of AI scribe software   History of Present Illness Samuel Watkins is a 78 year old male with peripheral arterial disease who presents for follow-up and upcoming angiogram.  He has a history of peripheral arterial disease with moderate bilateral superficial femoral artery disease noted on imaging from March 21, 2024. He experiences chronic leg pain that worsens with walking or climbing stairs, necessitating rest due to discomfort. He describes the sensation as 'just hurt' and has a history of blood clots in his leg. An angiogram is scheduled for April 21, 2024, to further evaluate his condition. He is unsure if he is currently taking cilostazol  50 mg twice a day.  His cardiovascular history includes persistent atrial fibrillation, for which he underwent an ablation on October 06, 2022. He was previously on amiodarone  but has not needed it since the procedure. He continues to take Eliquis  5 mg twice a day for anticoagulation. Additionally, he has a history of coronary artery disease with multiple drug-eluting stents placed in the right coronary artery in 2001 and 2008, and in the left anterior descending artery in 2008. He carries nitroglycerin but reports never having used it despite a history of heart attacks.  He has chronic kidney disease stage 3A, with a creatinine level of 1.5. There was a past concern for kidney cancer, but recent evaluations suggest the presence of cysts rather than malignancy.  He has a history of COPD, which is followed by a pulmonologist. He reports annual lung x-rays showing two spots, but recent evaluations have  not indicated cancer.  He manages diabetes with Actos, semaglutide, metformin, and glimepiride. For hypertension, he takes losartan  and hydrochlorothiazide  50/12.5 mg daily. He previously discontinued amlodipine  due to hypotension after weight loss.  He expresses distrust in his previous healthcare providers due to past misdiagnoses of cancer, which were later disproven by further testing.      Studies Reviewed: .        Results LABS Creatinine: 1.5  RADIOLOGY Abdominal aortic ultrasound: Mild dilation of distal aorta at 2.6 x 3.1 cm with ectasia. Less than 50% stenosis on right external iliac artery. (03/22/2024) MRI: Renal cysts, benign.  DIAGNOSTIC Echocardiogram: Ejection fraction 65%, trivial mitral regurgitation, ascending aorta 40 mm. (2023) Risk Assessment/Calculations:            Physical Exam:   VS:  BP 103/66   Pulse 63   Ht 6' 1 (1.854 m)   Wt 200 lb (90.7 kg)   SpO2 97%   BMI 26.39 kg/m    Wt Readings from Last 3 Encounters:  04/19/24 200 lb (90.7 kg)  03/28/24 203 lb 6.4 oz (92.3 kg)  02/08/24 208 lb 9.6 oz (94.6 kg)    GEN: Well nourished, well developed in no acute distress NECK: No JVD; No carotid bruits CARDIAC: RRR, no murmurs, no rubs, no gallops RESPIRATORY:  Clear to auscultation without rales, wheezing or rhonchi  ABDOMEN: Soft, non-tender, non-distended EXTREMITIES:  No edema; No deformity   ASSESSMENT AND PLAN: .    Assessment and Plan Assessment & Plan Peripheral arterial disease, bilateral lower extremities Chronic  bilateral lower extremity pain, exacerbated by walking and climbing stairs, suggestive of claudication. - Perform angiogram on October 9th to assess peripheral arterial disease.  Dr. Court note reviewed. - Hold Eliquis  prior to angiogram. - Failed cilostazol  therapy  Coronary artery disease, status post multiple stents Coronary artery disease with multiple stents placed, including bare metal and drug-eluting stents. No  recent angina or use of nitroglycerin. LDL cholesterol was 47 mg/dL in 7979, indicating good control. - Consider adding a lipid panel to recent blood work if possible.  Persistent atrial fibrillation, status post ablation on 10/06/22. Persistent atrial fibrillation, status post ablation on 10/06/22. Previously on amiodarone , but discontinued due to stability post-ablation. Currently managed with Eliquis  5 mg twice daily for anticoagulation. - Continue Eliquis  5 mg twice daily for anticoagulation.  Hypertension Hypertension managed with losartan -hydrochlorothiazide  50/12.5 mg daily. Blood pressure is well-controlled at 103/66 mmHg. - Continue losartan -hydrochlorothiazide  50/12.5 mg daily. - Monitor blood pressure and consider reducing medication if hypotension recurs.  Hyperlipidemia Hyperlipidemia managed with atorvastatin 80 mg daily. LDL cholesterol was 47 mg/dL in 7979, indicating effective control. - Consider adding a lipid panel to recent blood work if possible.  Chronic kidney disease, stage 3a Chronic kidney disease, stage 3a with a creatinine level of 1.5 mg/dL. Recent concerns about potential kidney cancer were alleviated after imaging showed cysts rather than malignancy.  Type 2 diabetes mellitus without complications Type 2 diabetes managed with Actos, semaglutide, metformin, and glimepiride. No complications reported.  Abdominal aortic ectasia Abdominal aortic ectasia with mild dilation of the distal aorta (2.6 x 3.1 cm) noted on ultrasound. Less than 50% stenosis in the right external iliac artery. - Repeat abdominal aortic ultrasound in one year.  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

## 2024-04-20 ENCOUNTER — Telehealth: Payer: Self-pay

## 2024-04-20 ENCOUNTER — Ambulatory Visit: Payer: Self-pay | Admitting: Cardiovascular Disease

## 2024-04-20 ENCOUNTER — Telehealth: Payer: Self-pay | Admitting: *Deleted

## 2024-04-20 LAB — BASIC METABOLIC PANEL WITH GFR
BUN/Creatinine Ratio: 14 (ref 10–24)
BUN: 18 mg/dL (ref 8–27)
CO2: 22 mmol/L (ref 20–29)
Calcium: 9.4 mg/dL (ref 8.6–10.2)
Chloride: 100 mmol/L (ref 96–106)
Creatinine, Ser: 1.32 mg/dL — ABNORMAL HIGH (ref 0.76–1.27)
Glucose: 282 mg/dL — ABNORMAL HIGH (ref 70–99)
Potassium: 4.6 mmol/L (ref 3.5–5.2)
Sodium: 137 mmol/L (ref 134–144)
eGFR: 56 mL/min/1.73 — ABNORMAL LOW (ref >59)

## 2024-04-20 LAB — CBC
Hematocrit: 39.3 % (ref 37.5–51.0)
Hemoglobin: 11.8 g/dL — AB (ref 13.0–17.7)
MCH: 24.8 pg — AB (ref 26.6–33.0)
MCHC: 30 g/dL — AB (ref 31.5–35.7)
MCV: 83 fL (ref 79–97)
Platelets: 172 x10E3/uL (ref 150–450)
RBC: 4.75 x10E6/uL (ref 4.14–5.80)
RDW: 16.4 % — AB (ref 11.6–15.4)
WBC: 7.1 x10E3/uL (ref 3.4–10.8)

## 2024-04-20 NOTE — Telephone Encounter (Signed)
 Unable to reach pt. Voicemail is not set up on phone. Will also send a voicemail. Will also send Mychart message.

## 2024-04-20 NOTE — Telephone Encounter (Signed)
 Cardiac Catheterization scheduled at [facility] for: Thursday April 21, 2024 7:30 AM Arrival time [facility] Main Entrance A at: 5:30 AM  Diet: -Nothing to eat after midnight.  Hydration: -May drink clear liquids until 2 hours before the procedure.  Approved liquids: Water, clear tea, black coffee, fruit juices-non-citric and without pulp,Gatorade, plain Jello/popsicles.   -Please drink 16 oz of water 2 hours before procedure.  Medication instructions: -Hold: Patient tells me he has a list of medications he should not take including:  Eliquis -none 04/19/24 until post procedure   Losartan /hydrochlorothiazide -AM of procedure  Patient knows not to take any diabetes medications the morning of the procedure including:  Empagliflozin /Metformin-day of procedure and 48 hours post procedure  Semaglutide -weekly on Mondays-did not take this past Monday -Other usual morning medications can be taken including aspirin  81 mg.  Plan to go home the same day, you will only stay overnight if medically necessary.  You must have responsible adult to drive you home.  Someone must be with you the first 24 hours after you arrive home.  Reviewed procedure instructions with patient.

## 2024-04-21 ENCOUNTER — Encounter (HOSPITAL_COMMUNITY): Payer: Self-pay | Admitting: Cardiovascular Disease

## 2024-04-21 ENCOUNTER — Other Ambulatory Visit: Payer: Self-pay

## 2024-04-21 ENCOUNTER — Encounter (HOSPITAL_COMMUNITY): Admission: RE | Disposition: A | Payer: Self-pay | Source: Home / Self Care | Attending: Cardiovascular Disease

## 2024-04-21 ENCOUNTER — Ambulatory Visit (HOSPITAL_COMMUNITY)
Admission: RE | Admit: 2024-04-21 | Discharge: 2024-04-22 | Disposition: A | Discharge status: Home / Self Care | Attending: Cardiovascular Disease | Admitting: Cardiovascular Disease

## 2024-04-21 DIAGNOSIS — Z7901 Long term (current) use of anticoagulants: Secondary | ICD-10-CM | POA: Insufficient documentation

## 2024-04-21 DIAGNOSIS — E1151 Type 2 diabetes mellitus with diabetic peripheral angiopathy without gangrene: Secondary | ICD-10-CM | POA: Insufficient documentation

## 2024-04-21 DIAGNOSIS — Z955 Presence of coronary angioplasty implant and graft: Secondary | ICD-10-CM | POA: Insufficient documentation

## 2024-04-21 DIAGNOSIS — Z8249 Family history of ischemic heart disease and other diseases of the circulatory system: Secondary | ICD-10-CM | POA: Diagnosis not present

## 2024-04-21 DIAGNOSIS — Z7984 Long term (current) use of oral hypoglycemic drugs: Secondary | ICD-10-CM | POA: Insufficient documentation

## 2024-04-21 DIAGNOSIS — Z87891 Personal history of nicotine dependence: Secondary | ICD-10-CM | POA: Insufficient documentation

## 2024-04-21 DIAGNOSIS — I70212 Atherosclerosis of native arteries of extremities with intermittent claudication, left leg: Secondary | ICD-10-CM | POA: Diagnosis not present

## 2024-04-21 DIAGNOSIS — Z7985 Long-term (current) use of injectable non-insulin antidiabetic drugs: Secondary | ICD-10-CM | POA: Insufficient documentation

## 2024-04-21 DIAGNOSIS — Z23 Encounter for immunization: Secondary | ICD-10-CM | POA: Insufficient documentation

## 2024-04-21 DIAGNOSIS — I4891 Unspecified atrial fibrillation: Secondary | ICD-10-CM | POA: Diagnosis not present

## 2024-04-21 DIAGNOSIS — E785 Hyperlipidemia, unspecified: Secondary | ICD-10-CM | POA: Diagnosis not present

## 2024-04-21 DIAGNOSIS — I739 Peripheral vascular disease, unspecified: Secondary | ICD-10-CM | POA: Diagnosis present

## 2024-04-21 DIAGNOSIS — G4733 Obstructive sleep apnea (adult) (pediatric): Secondary | ICD-10-CM | POA: Diagnosis not present

## 2024-04-21 DIAGNOSIS — I70213 Atherosclerosis of native arteries of extremities with intermittent claudication, bilateral legs: Secondary | ICD-10-CM | POA: Insufficient documentation

## 2024-04-21 DIAGNOSIS — Z79899 Other long term (current) drug therapy: Secondary | ICD-10-CM | POA: Diagnosis not present

## 2024-04-21 DIAGNOSIS — I10 Essential (primary) hypertension: Secondary | ICD-10-CM | POA: Diagnosis not present

## 2024-04-21 DIAGNOSIS — Z7902 Long term (current) use of antithrombotics/antiplatelets: Secondary | ICD-10-CM | POA: Insufficient documentation

## 2024-04-21 LAB — GLUCOSE, CAPILLARY
Glucose-Capillary: 195 mg/dL — ABNORMAL HIGH (ref 70–99)
Glucose-Capillary: 128 mg/dL — ABNORMAL HIGH (ref 70–99)

## 2024-04-21 LAB — POCT ACTIVATED CLOTTING TIME
Activated Clotting Time: 256 s
Activated Clotting Time: 302 s
Activated Clotting Time: 262 s
Activated Clotting Time: 204 s
Activated Clotting Time: 176 s

## 2024-04-21 MED ORDER — METOPROLOL SUCCINATE ER 25 MG PO TB24
12.5000 mg | ORAL_TABLET | Freq: Every day | ORAL | Status: DC
  Filled 2024-04-21: qty 1

## 2024-04-21 MED ORDER — HEPARIN SODIUM (PORCINE) 1000 UNIT/ML IJ SOLN
INTRAMUSCULAR | Status: AC
  Filled 2024-04-21: qty 10

## 2024-04-21 MED ORDER — MORPHINE SULFATE (PF) 2 MG/ML IV SOLN: 2.0000 mg | INTRAVENOUS | Status: DC | PRN

## 2024-04-21 MED ORDER — ONDANSETRON HCL 4 MG/2ML IJ SOLN: 4.0000 mg | Freq: Four times a day (QID) | INTRAMUSCULAR | Status: DC | PRN

## 2024-04-21 MED ORDER — NITROGLYCERIN 0.4 MG SL SUBL: 0.4000 mg | SUBLINGUAL_TABLET | SUBLINGUAL | Status: DC | PRN

## 2024-04-21 MED ORDER — LIDOCAINE HCL (PF) 1 % IJ SOLN
INTRAMUSCULAR | Status: AC
  Filled 2024-04-21: qty 30

## 2024-04-21 MED ORDER — FREE WATER
500.0000 mL | Freq: Once | Status: AC
  Administered 2024-04-21: 500 mL via ORAL

## 2024-04-21 MED ORDER — SODIUM CHLORIDE 0.9% FLUSH: 3.0000 mL | INTRAVENOUS | Status: DC | PRN

## 2024-04-21 MED ORDER — INFLUENZA VAC SPLIT HIGH-DOSE 0.5 ML IM SUSY
0.5000 mL | PREFILLED_SYRINGE | INTRAMUSCULAR | Status: AC
  Administered 2024-04-22: 0.5 mL via INTRAMUSCULAR
  Filled 2024-04-21: qty 0.5

## 2024-04-21 MED ORDER — MIDAZOLAM HCL 2 MG/2ML IJ SOLN
INTRAMUSCULAR | Status: AC
  Filled 2024-04-21: qty 2

## 2024-04-21 MED ORDER — IODIXANOL 320 MG/ML IV SOLN
INTRAVENOUS | Status: DC | PRN
  Administered 2024-04-21: 120 mL

## 2024-04-21 MED ORDER — FENTANYL CITRATE (PF) 100 MCG/2ML IJ SOLN
INTRAMUSCULAR | Status: DC | PRN
  Administered 2024-04-21 (×2): 25 ug via INTRAVENOUS

## 2024-04-21 MED ORDER — SODIUM CHLORIDE 0.9% FLUSH: 3.0000 mL | Freq: Two times a day (BID) | INTRAVENOUS | Status: DC

## 2024-04-21 MED ORDER — SODIUM CHLORIDE 0.9% FLUSH
3.0000 mL | Freq: Two times a day (BID) | INTRAVENOUS | Status: DC
  Administered 2024-04-21: 3 mL via INTRAVENOUS

## 2024-04-21 MED ORDER — SODIUM CHLORIDE 0.9 % IV SOLN: 250.0000 mL | INTRAVENOUS | Status: DC | PRN

## 2024-04-21 MED ORDER — LOSARTAN POTASSIUM-HCTZ 50-12.5 MG PO TABS
1.0000 | ORAL_TABLET | Freq: Every day | ORAL | Status: DC
Start: 2024-04-21 — End: 2024-04-21

## 2024-04-21 MED ORDER — CLOPIDOGREL BISULFATE 300 MG PO TABS
ORAL_TABLET | ORAL | Status: AC
  Filled 2024-04-21: qty 1

## 2024-04-21 MED ORDER — LOSARTAN POTASSIUM 50 MG PO TABS
50.0000 mg | ORAL_TABLET | Freq: Every day | ORAL | Status: DC
Start: 2024-04-22 — End: 2024-04-22

## 2024-04-21 MED ORDER — ACETAMINOPHEN 325 MG PO TABS: 650.0000 mg | ORAL_TABLET | ORAL | Status: DC | PRN

## 2024-04-21 MED ORDER — LIDOCAINE HCL (PF) 1 % IJ SOLN
INTRAMUSCULAR | Status: DC | PRN
  Administered 2024-04-21: 10 mL

## 2024-04-21 MED ORDER — PANTOPRAZOLE SODIUM 40 MG PO TBEC
40.0000 mg | DELAYED_RELEASE_TABLET | Freq: Every day | ORAL | Status: DC
  Filled 2024-04-21: qty 1

## 2024-04-21 MED ORDER — ASPIRIN 81 MG PO CHEW
CHEWABLE_TABLET | ORAL | Status: AC
  Filled 2024-04-21: qty 1

## 2024-04-21 MED ORDER — ASPIRIN 81 MG PO TBEC
81.0000 mg | DELAYED_RELEASE_TABLET | Freq: Every day | ORAL | Status: DC
  Filled 2024-04-21: qty 1

## 2024-04-21 MED ORDER — ASPIRIN 81 MG PO CHEW
CHEWABLE_TABLET | ORAL | Status: DC | PRN
  Administered 2024-04-21: 81 mg via ORAL

## 2024-04-21 MED ORDER — ATORVASTATIN CALCIUM 80 MG PO TABS: 80.0000 mg | ORAL_TABLET | Freq: Every day | ORAL | Status: DC

## 2024-04-21 MED ORDER — HYDRALAZINE HCL 20 MG/ML IJ SOLN: 5.0000 mg | INTRAMUSCULAR | Status: DC | PRN

## 2024-04-21 MED ORDER — HYDROCHLOROTHIAZIDE 12.5 MG PO TABS: 12.5000 mg | ORAL_TABLET | Freq: Every day | ORAL | Status: DC

## 2024-04-21 MED ORDER — MIDAZOLAM HCL 2 MG/2ML IJ SOLN
INTRAMUSCULAR | Status: DC | PRN
  Administered 2024-04-21: 1 mg via INTRAVENOUS

## 2024-04-21 MED ORDER — SODIUM CHLORIDE 0.9 % IV SOLN: INTRAVENOUS | Status: AC

## 2024-04-21 MED ORDER — HEPARIN (PORCINE) IN NACL 1000-0.9 UT/500ML-% IV SOLN
INTRAVENOUS | Status: DC | PRN
  Administered 2024-04-21: 1000 mL via SURGICAL_CAVITY

## 2024-04-21 MED ORDER — ALBUTEROL (5 MG/ML) CONTINUOUS INHALATION SOLN: 2.5000 mg | INHALATION_SOLUTION | Freq: Four times a day (QID) | RESPIRATORY_TRACT | Status: DC | PRN

## 2024-04-21 MED ORDER — CLOPIDOGREL BISULFATE 75 MG PO TABS: 75.0000 mg | ORAL_TABLET | Freq: Every day | ORAL | Status: DC

## 2024-04-21 MED ORDER — HEPARIN SODIUM (PORCINE) 1000 UNIT/ML IJ SOLN
INTRAMUSCULAR | Status: DC | PRN
  Administered 2024-04-21: 2000 [IU] via INTRAVENOUS
  Administered 2024-04-21: 10000 [IU] via INTRAVENOUS
  Administered 2024-04-21: 2000 [IU] via INTRAVENOUS

## 2024-04-21 MED ORDER — LABETALOL HCL 5 MG/ML IV SOLN: 10.0000 mg | INTRAVENOUS | Status: DC | PRN

## 2024-04-21 MED ORDER — FENTANYL CITRATE (PF) 100 MCG/2ML IJ SOLN
INTRAMUSCULAR | Status: AC
  Filled 2024-04-21: qty 2

## 2024-04-21 MED ORDER — CLOPIDOGREL BISULFATE 300 MG PO TABS
ORAL_TABLET | ORAL | Status: DC | PRN
  Administered 2024-04-21: 300 mg via ORAL

## 2024-04-21 NOTE — Interval H&P Note (Signed)
 History and Physical Interval Note:  04/21/2024 9:38 AM  Samuel Watkins  has presented today for surgery, with the diagnosis of PAD.  The various methods of treatment have been discussed with the patient and family. After consideration of risks, benefits and other options for treatment, the patient has consented to  Procedure(s): Lower Extremity Angiography (N/A) as a surgical intervention.  The patient's history has been reviewed, patient examined, no change in status, stable for surgery.  I have reviewed the patient's chart and labs.  Questions were answered to the patient's satisfaction.     Dorn Lesches

## 2024-04-21 NOTE — Plan of Care (Signed)

## 2024-04-21 NOTE — Progress Notes (Signed)
 Sheath pulled at 1435 Vital signs checked and stable before pull. Sheath was aspirated and removed, pressure held. Vitals were checked every 5 minutes. Site covered with tegaderm and gauze. No hematoma and site is level zero. Distal pulse palpable. Vital signs stable post pull. Education given to patient and bedrest started at 1510.

## 2024-04-22 ENCOUNTER — Encounter (HOSPITAL_COMMUNITY): Payer: Self-pay | Admitting: Cardiovascular Disease

## 2024-04-22 DIAGNOSIS — E1151 Type 2 diabetes mellitus with diabetic peripheral angiopathy without gangrene: Secondary | ICD-10-CM | POA: Diagnosis not present

## 2024-04-22 DIAGNOSIS — I70212 Atherosclerosis of native arteries of extremities with intermittent claudication, left leg: Secondary | ICD-10-CM | POA: Diagnosis not present

## 2024-04-22 LAB — BASIC METABOLIC PANEL WITH GFR
Anion gap: 11 (ref 5–15)
BUN: 22 mg/dL (ref 8–23)
CO2: 23 mmol/L (ref 22–32)
Calcium: 8.7 mg/dL — ABNORMAL LOW (ref 8.9–10.3)
Chloride: 105 mmol/L (ref 98–111)
Creatinine, Ser: 1.29 mg/dL — ABNORMAL HIGH (ref 0.61–1.24)
GFR, Estimated: 57 mL/min — ABNORMAL LOW (ref >60)
Glucose, Bld: 162 mg/dL — ABNORMAL HIGH (ref 70–99)
Potassium: 4.4 mmol/L (ref 3.5–5.1)
Sodium: 139 mmol/L (ref 135–145)

## 2024-04-22 LAB — CBC
HCT: 34.9 % — ABNORMAL LOW (ref 39.0–52.0)
Hemoglobin: 11.1 g/dL — ABNORMAL LOW (ref 13.0–17.0)
MCH: 25.1 pg — ABNORMAL LOW (ref 26.0–34.0)
MCHC: 31.8 g/dL (ref 30.0–36.0)
MCV: 79 fL — ABNORMAL LOW (ref 80.0–100.0)
Platelets: 156 K/uL (ref 150–400)
RBC: 4.42 MIL/uL (ref 4.22–5.81)
RDW: 16.3 % — ABNORMAL HIGH (ref 11.5–15.5)
WBC: 8.2 K/uL (ref 4.0–10.5)
nRBC: 0 % (ref 0.0–0.2)

## 2024-04-22 LAB — LIPID PANEL
Cholesterol: 100 mg/dL (ref 0–200)
HDL: 19 mg/dL — ABNORMAL LOW (ref >40)
LDL Cholesterol: 48 mg/dL (ref 0–99)
Total CHOL/HDL Ratio: 5.3 ratio
Triglycerides: 164 mg/dL — ABNORMAL HIGH (ref <150)
VLDL: 33 mg/dL (ref 0–40)

## 2024-04-22 MED ORDER — ASPIRIN 81 MG PO TBEC: 81.0000 mg | DELAYED_RELEASE_TABLET | Freq: Every day | ORAL | 0 refills | Status: DC

## 2024-04-22 MED ORDER — CLOPIDOGREL BISULFATE 75 MG PO TABS: 75.0000 mg | ORAL_TABLET | Freq: Every day | ORAL | 1 refills | Status: AC

## 2024-04-22 NOTE — Progress Notes (Signed)
 PHARMACIST LIPID MONITORING   Samuel Watkins is a 78 y.o. male admitted on 04/21/2024 with PAD s/p lithrotripsy/shockwave to left SFA.  Pharmacy has been consulted to optimize lipid-lowering therapy with the indication of secondary prevention for clinical ASCVD.  Recent Labs:  Lipid Panel (last 6 months):   Lab Results  Component Value Date   CHOL 100 04/22/2024   TRIG 164 (H) 04/22/2024   HDL 19 (L) 04/22/2024   CHOLHDL 5.3 04/22/2024   VLDL 33 04/22/2024   LDLCALC 48 04/22/2024    Hepatic function panel (last 6 months):   No results found for: AST, ALT, ALKPHOS, BILITOT, BILIDIR, IBILI  SCr (since admission):   Serum creatinine: 1.29 mg/dL (H) 89/89/74 9559 Estimated creatinine clearance: 55.1 mL/min (A)  Current therapy and lipid therapy tolerance Current lipid-lowering therapy: atorvastatin 80 mg PO daily  Assessment:   Patient is a 78 yoM with PAD s/p left SFA shockwave for calcifications. He has been maintained on atorvastatin 80 mg daily for PAD and CAD. LDL is 48, which is at goal.   Plan:   1.Statin intensity (high intensity recommended for all patients regardless of the LDL):  No statin changes. The patient is already on a high intensity statin.  Samuel Watkins, PharmD PGY1 Pharmacy Resident

## 2024-04-22 NOTE — Discharge Summary (Signed)
 Discharge Summary   Patient ID: Samuel Watkins MRN: [id number]; DOB: [birth year]  Admit date: 04/21/2024 Discharge date: 04/22/2024  PCP:  Okey Carlin Redbird, MD   [facility] Providers Cardiologist:  Oneil Parchment, MD  Electrophysiologist:  Eulas FORBES Furbish, MD  PV Cardiologist:  Dorn Lesches, MD   Discharge Diagnoses  Principal Problem:   Claudication in peripheral vascular disease  Diagnostic Studies/Procedures   PV angiogram: 04/21/2024  Angiographic Data:    1: Abdominal aorta-renal arteries were patent.  Infrarenal abdominal aorta was severely atherosclerotic with appear to be ulcerated plaque on the right lateral border. 2: Left lower extremity-long segment 95% calcified proximal/mid left SFA stenosis.  There are collaterals from the profunda to the popliteal artery and the adductor canal.  There is three-vessel runoff. 3: Right lower extremity-right iliac arteries widely patent.  There is a 50% segmental calcified proximal to mid right SFA stenosis and fairly focal 95% stenosis just beyond this with three-vessel runoff   IMPRESSION: Mr. Dutko has bilateral SFA disease left greater than right.  Will proceed with left SFA intervention using intravascular lithotripsy/shockwave followed by DCB.  Final Impression: Successful left SFA intravascular lithotripsy/shockwave followed by [facility] of the long 95% calcified segmental proximal and mid stenosis with lifestyle-limiting claudication.  The sheath of the removed was a steep hospital and 70 pressure be held.  He will be hydrated overnight and discharged home the morning on triple therapy.  Eliquis  to be started tomorrow.  Aspirin  can be discontinued in 30 days.  I will see him back in the office in 1 to 2 weeks after which we will decide on timing of staged right SFA intervention.  He left the lab in stable condition.   Dorn Lesches. MD, [facility] 04/21/2024 11:58 AM _____________   History of Present Illness   Samuel Watkins is a 78 y.o. male with past medical history of hypertension, diabetes, hyperlipidemia, CAD with prior stenting as well as atrial fibrillation status post ablation 09/2022 and OSA on CPAP who was recently seen in the office with Dr. Lesches and complained of lifestyle-limiting claudication.  He had outpatient Dopplers 9/8 which revealed ABIs in the 0.85 range bilaterally #5 high-frequency signals in both SFAs, iliac Dopplers did not show significant disease.  He was set up for outpatient peripheral angiogram.   Hospital Course    Underwent successful left SFA intravascular lithotripsy with shockwave followed by drug-coated balloon for a 95% calcified segment paroxysmal and mid stenosis.  Observed overnight without location.  Recommendations for triple therapy with aspirin , Plavix  and Eliquis  for 1 month then stop aspirin .  Able to ambulate without issue.   General: Well developed, well nourished, male appearing in no acute distress. Head: Normocephalic, atraumatic.  Neck: Supple without bruits, JVD. Lungs:  Resp regular and unlabored, CTA. Heart: RRR, S1, S2, no S3, S4, or murmur; no rub. Abdomen: Soft, non-tender, non-distended with normoactive bowel sounds.  Extremities: No clubbing, cyanosis, edema. Distal pedal pulses are 2+ bilaterally. Right femoral cath site stable without bruising or hematoma Neuro: Alert and oriented X 3. Moves all extremities spontaneously.  Did the patient have an acute coronary syndrome (MI, NSTEMI, STEMI, etc) this admission?:  No                               Did the patient have a percutaneous coronary intervention (stent / angioplasty)?:  No.    _____________  Discharge Vitals Blood pressure ROLLEN)  125/57, pulse 66, temperature 98.3 F (36.8 C), temperature source Oral, resp. rate 18, height 6' 1 (1.854 m), weight 90.7 kg, SpO2 93%.  Filed Weights   04/21/24 0552  Weight: 90.7 kg    Labs & Radiologic Studies  CBC Recent Labs    04/19/24 0957  04/22/24 0440  WBC 7.1 8.2  HGB 11.8* 11.1*  HCT 39.3 34.9*  MCV 83 79.0*  PLT 172 156   Basic Metabolic Panel Recent Labs    89/92/74 0957 04/22/24 0440  NA 137 139  K 4.6 4.4  CL 100 105  CO2 22 23  GLUCOSE 282* 162*  BUN 18 22  CREATININE 1.32* 1.29*  CALCIUM 9.4 8.7*   Liver Function Tests No results for input(s): AST, ALT, ALKPHOS, BILITOT, PROT, ALBUMIN in the last 72 hours. No results for input(s): LIPASE, AMYLASE in the last 72 hours. High Sensitivity Troponin:   No results for input(s): TROPONINIHS in the last 720 hours.  No results for input(s): TRNPT in the last 720 hours.  BNP Invalid input(s): POCBNP No results for input(s): PROBNP in the last 72 hours.  No results for input(s): BNP in the last 72 hours.  D-Dimer No results for input(s): DDIMER in the last 72 hours. Hemoglobin A1C No results for input(s): HGBA1C in the last 72 hours. Fasting Lipid Panel Recent Labs    04/22/24 0440  CHOL 100  HDL 19*  LDLCALC 48  TRIG 835*  CHOLHDL 5.3   No results found for: LIPOA  Thyroid  Function Tests No results for input(s): TSH, T4TOTAL, T3FREE, THYROIDAB in the last 72 hours.  Invalid input(s): FREET3 _____________  PERIPHERAL VASCULAR CATHETERIZATION Result Date: 04/21/2024 Images from the original result were not included.  [id number] LOCATION:  FACILITY: [facility] PHYSICIAN: Dorn Lesches, M.D. 25-May-1946 DATE OF PROCEDURE:  04/21/2024 DATE OF DISCHARGE: PV Angiogram/Intervention History obtained from chart review.Samuel Watkins is a 78 y.o.   mildly overweight married Caucasian male father of 3 children, grandfather of 5 grandchildren referred by Dr. Okey, his PCP, for evaluation of PAD.  He is a cardiology patient of Dr. Jeffrie and an EP patient of Dr. Marko.  I last saw him in the office 02/08/2024.  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  History factors include discontinue tobacco abuse 15  years ago, treated hypertension, diabetes and hyperlipidemia as well as family history of heart disease with a father who had heart attacks.  He has had coronary stenting in 2001 and 2008.  He is also had A-fib ablation by Dr. Nancey 10/06/2022.  He has obstructive sleep apnea on CPAP.  He is complaining of hip pain for years as well as some calf pain.  He is most recent Dopplers revealed ABIs in the 0.8 range bilaterally.  Since I saw him in the office 2 months ago I did begin him on Pletal  empirically which did not provide him any clinical benefit.  I did get Dopplers on him 03/21/2024 that revealed ABIs in the 0.85 range bilaterally with high-frequency signals in both SFAs.  His iliac Dopplers did not show iliac disease.  He wishes to proceed with outpatient peripheral angiography and endovascular therapy for lifestyle-limiting claudication. Pre Procedure Diagnosis: Peripheral arterial disease Post Procedure Diagnosis: Peripheral arterial disease Operators: Dr. Dorn Lesches Procedures Performed:  1.  Ultrasound-guided right common femoral access  2.  Abdominal aortogram/bilateral iliac angiogram  3.  Contralateral access (secondary catheter placement)  4.  PTA, IVL, DCB left  SFA PROCEDURE DESCRIPTION: The patient was brought to the second floor [place] [facility] lab in the the postabsorptive state. He was premedicated with IV Versed and fentanyl . His right groin was prepped and shaved in usual sterile fashion. Xylocaine  1% was used for local anesthesia. A 5 French sheath was inserted into the right common femoral artery using standard Seldinger technique.  Ultrasound was used to identify the right common femoral artery and guide access.  Digital image was captured and placed in patient chart.  A 5 French pigtail catheter is placed in the distal abdominal aorta.  Distal abdominal aortography, and bilateral iliac angiography were performed using Omnipaque  dye.  Lower extremity angiography was performed of each  leg individually.  Total contrast used in the case was 120 cc of Omnipaque  dye.  Retrograde aortic pressures monitored in the case  Angiographic Data: 1: Abdominal aorta-renal arteries were patent.  Infrarenal abdominal aorta was severely atherosclerotic with appear to be ulcerated plaque on the right lateral border. 2: Left lower extremity-long segment 95% calcified proximal/mid left SFA stenosis.  There are collaterals from the profunda to the popliteal artery and the adductor canal.  There is three-vessel runoff. 3: Right lower extremity-right iliac arteries widely patent.  There is a 50% segmental calcified proximal to mid right SFA stenosis and fairly focal 95% stenosis just beyond this with three-vessel runoff   Mr. Shiffler has bilateral SFA disease left greater than right.  Will proceed with left SFA intervention using intravascular lithotripsy/shockwave followed by DCB. Procedure Description: Contralateral access was obtained with a crossover catheter, Glidewire and Rosen wire along with a 7 French/45 cm long Ansell sheath.  Patient received a total of 14,000's of heparin  with a ending ACT of 262.  I was able to cross the long segment of diffuse disease with a 1.4 shepherd wire and a 014 CXI endhole catheter.  I then predilated the entire segment with a 2.5 mm x 100 mm long coyote balloon and did multiple inflations of a 5 mm x 80 mm long shockwave catheter throughout the entirety of the disease segment resulting in excellent balloon expansion.  On this I performed DCB with 5 mm x 150 mm / 5 mm x 80 mm Ranger DCB resulting reduction of the long 95% calcified proximal and mid left SFA stenosis less than 10% stenosis with small linear nonflow limiting dissection.  Patient tolerated procedure well.  The Ansell sheath was withdrawn across the bifurcation and exchanged over an 035 purser wire for a short 7 [place] sheath which was then secured in place.  Patient did receive aspirin  and 300 mg of clopidogrel  at  the end of the case. Final Impression: Successful left SFA intravascular lithotripsy/shockwave followed by [facility] of the long 95% calcified segmental proximal and mid stenosis with lifestyle-limiting claudication.  The sheath of the removed was a steep hospital and 70 pressure be held.  He will be hydrated overnight and discharged home the morning on triple therapy.  Eliquis  to be started tomorrow.  Aspirin  can be discontinued in 30 days.  I will see him back in the office in 1 to 2 weeks after which we will decide on timing of staged right SFA intervention.  He left the lab in stable condition. Dorn Lesches. MD, [facility] 04/21/2024 11:58 AM    MR ABDOMEN WWO CONTRAST Result Date: 03/29/2024 CLINICAL DATA:  Follow-up of renal masses seen on prior MRI from 10/15/2023. EXAM: MRI ABDOMEN WITHOUT AND WITH CONTRAST TECHNIQUE: Multiplanar multisequence MR imaging of  the abdomen was performed both before and after the administration of intravenous contrast. CONTRAST:  10 mL of Vueway . COMPARISON:  MRI abdomen from 09/05/2023. FINDINGS: Lower chest: There is a stable 9 x 9 mm nodule in the right lung lower lobe, grossly similar to the prior study. No pleural effusion. No pericardial effusion. Normal heart size. Hepatobiliary: Mild hepatomegaly. Noncirrhotic configuration. No focal lesion. There is mild diffuse hepatic steatosis. No focal lesion. No intrahepatic or extrahepatic bile duct dilatation. No choledocholithiasis. Unremarkable gallbladder. Pancreas: No mass, inflammatory changes or other parenchymal abnormality identified. No main pancreatic duct dilation. Spleen:  Within normal limits in size and appearance. No focal mass. Adrenals/Urinary Tract: Unremarkable adrenal glands. No hydroureteronephrosis. There is a T1 and T2 hypointense lesion in the right kidney interpolar region, medially which exhibits diffusion restriction on high B value images, appears dark on ADC mapping and exhibits heterogeneous postcontrast  enhancement, favoring renal neoplasm. The lesion currently measures 16 x 19 mm, which previously measured up to 15 x 17 mm. There are multiple simple cysts in the right kidney with largest in the upper pole measuring up to 2.7 x 4.0 cm. Stomach/Bowel: Visualized portions within the abdomen are unremarkable. No disproportionate dilation of bowel loops. Vascular/Lymphatic: No pathologically enlarged lymph nodes identified. No abdominal aortic aneurysm demonstrated. No ascites. Other:  None. Musculoskeletal: No suspicious bone lesions identified. IMPRESSION: 1. There is a 16 x 19 mm lesion in the right kidney interpolar region, medially, which is favored to represent renal neoplasm. Minimal interval increase in size since the prior study. 2. No metastatic disease identified within the abdomen. 3. Multiple other nonacute observations, as described above. Electronically Signed   By: Ree Molt M.D.   On: 03/29/2024 18:01    Disposition Pt is being discharged home today in good condition.  Follow-up Plans & Appointments  Discharge Instructions     Call MD for:  redness, tenderness, or signs of infection (pain, swelling, redness, odor or green/yellow discharge around incision site)   Complete by: As directed    Diet - low sodium heart healthy   Complete by: As directed    Discharge instructions   Complete by: As directed    Groin Site Care Refer to this sheet in the next few weeks. These instructions provide you with information on caring for yourself after your procedure. Your caregiver may also give you more specific instructions. Your treatment has been planned according to current medical practices, but problems sometimes occur. Call your caregiver if you have any problems or questions after your procedure. HOME CARE INSTRUCTIONS You may shower 24 hours after the procedure. Remove the bandage (dressing) and gently wash the site with plain soap and water. Gently pat the site dry.  Do not apply  powder or lotion to the site.  Do not sit in a bathtub, swimming pool, or whirlpool for 5 to 7 days.  No bending, squatting, or lifting anything over 10 pounds (4.5 kg) as directed by your caregiver.  Inspect the site at least twice daily.  Do not drive home if you are discharged the same day of the procedure. Have someone else drive you.  You may drive 24 hours after the procedure unless otherwise instructed by your caregiver.  What to expect: Any bruising will usually fade within 1 to 2 weeks.  Blood that collects in the tissue (hematoma) may be painful to the touch. It should usually decrease in size and tenderness within 1 to 2 weeks.  SEEK IMMEDIATE MEDICAL  CARE IF: You have unusual pain at the groin site or down the affected leg.  You have redness, warmth, swelling, or pain at the groin site.  You have drainage (other than a small amount of blood on the dressing).  You have chills.  You have a fever or persistent symptoms for more than 72 hours.  You have a fever and your symptoms suddenly get worse.  Your leg becomes pale, cool, tingly, or numb.  You have heavy bleeding from the site. Hold pressure on the site. .   Increase activity slowly   Complete by: As directed        Discharge Medications Allergies as of 04/22/2024   No Known Allergies      Medication List     STOP taking these medications    cilostazol  50 MG tablet Commonly known as: PLETAL        TAKE these medications    Accu-Chek SmartView test strip Generic drug: glucose blood See admin instructions.   acetaminophen  500 MG tablet Commonly known as: TYLENOL  Take 1,000 mg by mouth every 6 (six) hours as needed for moderate pain.   albuterol  108 (90 Base) MCG/ACT inhaler Commonly known as: VENTOLIN  HFA Inhale 2 puffs into the lungs 4 (four) times daily as needed for wheezing or shortness of breath.   Alpha-Lipoic Acid 600 MG Caps Take 1 capsule by mouth every morning.   apixaban  5 MG Tabs  tablet Commonly known as: ELIQUIS  Take 1 tablet (5 mg total) by mouth 2 (two) times daily.   aspirin  EC 81 MG tablet Take 1 tablet (81 mg total) by mouth daily. Swallow whole.   atorvastatin 80 MG tablet Commonly known as: LIPITOR Take 80 mg by mouth at bedtime.   clopidogrel  75 MG tablet Commonly known as: PLAVIX  Take 1 tablet (75 mg total) by mouth daily with breakfast.   docusate sodium 50 MG capsule Commonly known as: COLACE Take 50 mg by mouth daily as needed for mild constipation.   Empagliflozin -metFORMIN HCl ER 12.11-998 MG Tb24 Take 2 tablets by mouth daily.   gabapentin 300 MG capsule Commonly known as: NEURONTIN Take 300 mg by mouth at bedtime.   losartan -hydrochlorothiazide  50-12.5 MG tablet Commonly known as: Hyzaar Take 1 tablet by mouth daily.   MAGNESIUM PO Take 2 tablets by mouth daily.   metoprolol  succinate 25 MG 24 hr tablet Commonly known as: TOPROL -XL Take 12.5 mg by mouth daily.   nitroGLYCERIN 0.4 MG SL tablet Commonly known as: NITROSTAT Place 0.4 mg under the tongue every 5 (five) minutes x 3 doses as needed for chest pain (Call 911 at 3rd dose within 15 minutes.).   pantoprazole 40 MG tablet Commonly known as: PROTONIX TAKE ONE TABLET BY MOUTH DAILY (TAKE ON AN EMPTY STOMACH 30 MINUTES PRIOR TO A MEAL)   Semaglutide(0.25 or 0.5MG /DOS) 2 MG/3ML Sopn Inject 0.25 mg as directed once a week. Takes on Mondays   Stiolto Respimat 2.5-2.5 MCG/ACT Aers Generic drug: Tiotropium Bromide-Olodaterol Inhale 2 each into the lungs daily.   Vitamin D3 125 MCG (5000 UT) Caps Take 5,000 Units by mouth daily.         Outstanding Labs/Studies  LE dopplers (arranged)  Duration of Discharge Encounter: APP Time: 25 minutes   Signed, Manuelita Rummer, NP 04/22/2024, 9:54 AM

## 2024-04-22 NOTE — Plan of Care (Signed)

## 2024-05-05 ENCOUNTER — Encounter (HOSPITAL_COMMUNITY)

## 2024-05-12 ENCOUNTER — Ambulatory Visit (HOSPITAL_COMMUNITY)
Admission: RE | Admit: 2024-05-12 | Discharge: 2024-05-12 | Disposition: A | Discharge status: Home / Self Care | Source: Ambulatory Visit | Attending: Cardiovascular Disease | Admitting: Cardiovascular Disease

## 2024-05-12 ENCOUNTER — Ambulatory Visit (HOSPITAL_BASED_OUTPATIENT_CLINIC_OR_DEPARTMENT_OTHER)
Admission: RE | Admit: 2024-05-12 | Discharge: 2024-05-12 | Disposition: A | Discharge status: Home / Self Care | Source: Ambulatory Visit | Attending: Cardiovascular Disease | Admitting: Cardiovascular Disease

## 2024-05-12 DIAGNOSIS — I739 Peripheral vascular disease, unspecified: Secondary | ICD-10-CM | POA: Diagnosis not present

## 2024-05-13 LAB — VAS US ABI WITH/WO TBI
Left ABI: 0.93
Right ABI: 0.73

## 2024-05-16 ENCOUNTER — Other Ambulatory Visit: Payer: Self-pay | Admitting: Cardiology

## 2024-05-16 ENCOUNTER — Ambulatory Visit: Admitting: Cardiovascular Disease

## 2024-05-18 MED ORDER — ASPIRIN 81 MG PO TBEC: 81.0000 mg | DELAYED_RELEASE_TABLET | Freq: Every day | ORAL | 3 refills | Status: AC

## 2024-05-19 ENCOUNTER — Ambulatory Visit: Attending: Cardiovascular Disease | Admitting: Cardiovascular Disease

## 2024-05-19 ENCOUNTER — Encounter: Payer: Self-pay | Admitting: Cardiovascular Disease

## 2024-05-19 VITALS — BP 103/59 | HR 56 | Ht 73.0 in | Wt 203.0 lb

## 2024-05-19 DIAGNOSIS — I739 Peripheral vascular disease, unspecified: Secondary | ICD-10-CM | POA: Diagnosis not present

## 2024-05-19 NOTE — Assessment & Plan Note (Signed)
 Samuel Watkins returns today for posthospital follow-up after his recent intervention performed by myself 04/21/2024 because of claudication.  He had a subtotally occluded calcified long segment proximal and mid left SFA stenosis which I intervened on using shockwave lithotripsy and DCB with excellent result.  He had three-vessel runoff.  His follow-up Doppler studies revealed his left SFA to be patent with an improvement in his left ABI from 0.86-0.93.  He did have moderate disease in his right SFA as well.  Unfortunately, he has not noticed any improvement in his symptoms and he thinks this may be related to his diabetic peripheral neuropathy.  Given the lack of improvement despite a successful procedure with increased ABI I do not think it is worth proceeding with staged right SFA intervention.  Will continue to follow him noninvasively.

## 2024-05-19 NOTE — Progress Notes (Signed)
 05/19/2024 Samuel Watkins   04-17-1946  [id number]  Primary Physician Samuel Carlin Redbird, MD Primary Cardiologist: Samuel JINNY Lesches MD [facility], [place], FSCAI  HPI:  Samuel Watkins is a 78 y.o.    mildly overweight married Caucasian male father of 3 children, grandfather of 5 grandchildren referred by Dr. Okey, his PCP, for evaluation of PAD.  He is a cardiology patient of Dr. Jeffrie and an EP patient of Dr. Marko.  I last saw him in the office 03/28/2024.  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  History factors include discontinue tobacco abuse 15 years ago, treated hypertension, diabetes and hyperlipidemia as well as family history of heart disease with a father who had heart attacks.  He has had coronary stenting in 2001 and 2008.  He is also had A-fib ablation by Dr. Nancey 10/06/2022.  He has obstructive sleep apnea on CPAP.  He is complaining of hip pain for years as well as some calf pain.  He is most recent Dopplers revealed ABIs in the 0.8 range bilaterally.    I began him on Pletal  empirically which did not provide him any clinical benefit.  I did get Dopplers on him 03/21/2024 that revealed ABIs in the 0.85 range bilaterally with high-frequency signals in both SFAs.  His iliac Dopplers did not show iliac disease.  He wishes to proceed with outpatient peripheral angiography and endovascular therapy for lifestyle-limiting claudication.  I performed outpatient peripheral angiography and intervention on him 04/21/2024.  He had a long segment calcified proximal and mid left SFA stenosis which I performed shockwave angioplasty followed by [facility] with excellent result.  He did have three-vessel runoff bilaterally.  He had high-grade calcified mid right SFA disease as well.  His ABI improved postprocedure from 0.86 up to 0.93.  Unfortunately, he did not enjoy any clinical benefit and therefore staged right SFA intervention would not be beneficial.   Current Meds   Medication Sig   acetaminophen  (TYLENOL ) 500 MG tablet Take 1,000 mg by mouth every 6 (six) hours as needed for moderate pain.   albuterol  (PROVENTIL  HFA;VENTOLIN  HFA) 108 (90 BASE) MCG/ACT inhaler Inhale 2 puffs into the lungs 4 (four) times daily as needed for wheezing or shortness of breath.   Alpha-Lipoic Acid 600 MG CAPS Take 1 capsule by mouth every morning.   apixaban  (ELIQUIS ) 5 MG TABS tablet Take 1 tablet (5 mg total) by mouth 2 (two) times daily.   aspirin  EC 81 MG tablet Take 1 tablet (81 mg total) by mouth daily. Swallow whole.   atorvastatin (LIPITOR) 80 MG tablet Take 80 mg by mouth at bedtime.   Cholecalciferol (VITAMIN D3) 125 MCG (5000 UT) CAPS Take 5,000 Units by mouth daily.   clopidogrel  (PLAVIX ) 75 MG tablet Take 1 tablet (75 mg total) by mouth daily with breakfast.   docusate sodium (COLACE) 50 MG capsule Take 50 mg by mouth daily as needed for mild constipation.   Empagliflozin -metFORMIN HCl ER 12.11-998 MG TB24 Take 2 tablets by mouth daily.   gabapentin (NEURONTIN) 300 MG capsule Take 300 mg by mouth at bedtime.   glucose blood (ACCU-CHEK SMARTVIEW) test strip See admin instructions.   losartan -hydrochlorothiazide  (HYZAAR) 50-12.5 MG tablet Take 1 tablet by mouth daily.   MAGNESIUM PO Take 2 tablets by mouth daily.   metoprolol  succinate (TOPROL -XL) 25 MG 24 hr tablet Take 12.5 mg by mouth daily.   nitroGLYCERIN (NITROSTAT) 0.4 MG SL tablet Place  0.4 mg under the tongue every 5 (five) minutes x 3 doses as needed for chest pain (Call 911 at 3rd dose within 15 minutes.).   pantoprazole (PROTONIX) 40 MG tablet TAKE ONE TABLET BY MOUTH DAILY (TAKE ON AN EMPTY STOMACH 30 MINUTES PRIOR TO A MEAL)   Semaglutide,0.25 or 0.5MG /DOS, 2 MG/3ML SOPN Inject 0.25 mg as directed once a week. Takes on Mondays   Tiotropium Bromide-Olodaterol (STIOLTO RESPIMAT) 2.5-2.5 MCG/ACT AERS Inhale 2 each into the lungs daily.     No Known Allergies  Social History   Socioeconomic History    Marital status: Married    Spouse name: Rojelio   Number of children: 3   Years of education: 12   Highest education level: Not on file  Occupational History   Occupation: retired  Tobacco Use   Smoking status: Former    Current packs/day: 0.00    Average packs/day: 1 pack/day for 50.0 years (50.0 ttl pk-yrs)    Types: Cigarettes    Start date: 07/14/1960    Quit date: 07/14/2010    Years since quitting: 13.8   Smokeless tobacco: Never   Tobacco comments:    Former smoker 11/03/22  Vaping Use   Vaping status: Never Used  Substance and Sexual Activity   Alcohol use: No    Alcohol/week: 0.0 standard drinks of alcohol   Drug use: No   Sexual activity: Not on file  Other Topics Concern   Not on file  Social History Narrative   Lives with wife   Caffeine- coffee 2 c, soda 1, tea 1   Social Drivers of Corporate Investment Banker Strain: Not on file  Food Insecurity: No Food Insecurity (04/21/2024)   Hunger Vital Sign    Worried About Running Out of Food in the Last Year: Never true    Ran Out of Food in the Last Year: Never true  Transportation Needs: No Transportation Needs (04/21/2024)   PRAPARE - Administrator, Civil Service (Medical): No    Lack of Transportation (Non-Medical): No  Physical Activity: Not on file  Stress: Not on file  Social Connections: Moderately Isolated (04/21/2024)   Social Connection and Isolation Panel    Frequency of Communication with Friends and Family: Three times a week    Frequency of Social Gatherings with Friends and Family: Three times a week    Attends Religious Services: Never    Active Member of Clubs or Organizations: No    Attends Banker Meetings: Never    Marital Status: Married  Catering Manager Violence: Not At Risk (04/21/2024)   Humiliation, Afraid, Rape, and Kick questionnaire    Fear of Current or Ex-Partner: No    Emotionally Abused: No    Physically Abused: No    Sexually Abused: No     Review of  Systems: General: negative for chills, fever, night sweats or weight changes.  Cardiovascular: negative for chest pain, dyspnea on exertion, edema, orthopnea, palpitations, paroxysmal nocturnal dyspnea or shortness of breath Dermatological: negative for rash Respiratory: negative for cough or wheezing Urologic: negative for hematuria Abdominal: negative for nausea, vomiting, diarrhea, bright red blood per rectum, melena, or hematemesis Neurologic: negative for visual changes, syncope, or dizziness All other systems reviewed and are otherwise negative except as noted above.    Blood pressure (!) 103/59, pulse (!) 56, height 6' 1 (1.854 m), weight 203 lb (92.1 kg), SpO2 95%.  General appearance: alert and no distress Neck: no adenopathy, no carotid  bruit, no JVD, supple, symmetrical, trachea midline, and thyroid  not enlarged, symmetric, no tenderness/mass/nodules Lungs: clear to auscultation bilaterally Heart: regular rate and rhythm, S1, S2 normal, no murmur, click, rub or gallop Extremities: extremities normal, atraumatic, no cyanosis or edema Pulses: Diminished pedal pulses Skin: Skin color, texture, turgor normal. No rashes or lesions Neurologic: Grossly normal  EKG not performed today      ASSESSMENT AND PLAN:   Claudication in peripheral vascular disease Mr. Esterly returns today for posthospital follow-up after his recent intervention performed by myself 04/21/2024 because of claudication.  He had a subtotally occluded calcified long segment proximal and mid left SFA stenosis which I intervened on using shockwave lithotripsy and DCB with excellent result.  He had three-vessel runoff.  His follow-up Doppler studies revealed his left SFA to be patent with an improvement in his left ABI from 0.86-0.93.  He did have moderate disease in his right SFA as well.  Unfortunately, he has not noticed any improvement in his symptoms and he thinks this may be related to his diabetic peripheral  neuropathy.  Given the lack of improvement despite a successful procedure with increased ABI I do not think it is worth proceeding with staged right SFA intervention.  Will continue to follow him noninvasively.     Samuel DOROTHA Lesches MD FACP,FACC,FAHA, [facility] 05/19/2024 11:52 AM

## 2024-05-19 NOTE — Patient Instructions (Signed)
 Medication Instructions:  Your physician recommends that you continue on your current medications as directed. Please refer to the Current Medication list given to you today.  *If you need a refill on your cardiac medications before your next appointment, please call your pharmacy*  Testing/Procedures: Your physician has requested that you have a lower extremity arterial duplex. During this test, ultrasound is used to evaluate arterial blood flow in the legs. Allow one hour for this exam. There are no restrictions or special instructions. This will take place at [address] 4th floor  **To do in October 2026**  Please note: We ask at that you not bring children with you during ultrasound (echo/ vascular) testing. Due to room size and safety concerns, children are not allowed in the ultrasound rooms during exams. Our front office staff cannot provide observation of children in our lobby area while testing is being conducted. An adult accompanying a patient to their appointment will only be allowed in the ultrasound room at the discretion of the ultrasound technician under special circumstances. We apologize for any inconvenience.   Your physician has requested that you have an ankle brachial index (ABI). During this test an ultrasound and blood pressure cuff are used to evaluate the arteries that supply the arms and legs with blood. Allow thirty minutes for this exam. There are no restrictions or special instructions. This will take place at [address] 4th floor   **To do in October 2026**   Please note: We ask at that you not bring children with you during ultrasound (echo/ vascular) testing. Due to room size and safety concerns, children are not allowed in the ultrasound rooms during exams. Our front office staff cannot provide observation of children in our lobby area while testing is being conducted. An adult accompanying a patient to their appointment will only be allowed in the  ultrasound room at the discretion of the ultrasound technician under special circumstances. We apologize for any inconvenience.   Follow-Up: At [facility], you and your health needs are our priority.  As part of our continuing mission to provide you with exceptional heart care, our providers are all part of one team.  This team includes your primary Cardiologist (physician) and Advanced Practice Providers or APPs (Physician Assistants and Nurse Practitioners) who all work together to provide you with the care you need, when you need it.  Your next appointment:   We will see you on an as needed basis.   Provider:   Dorn Lesches, MD

## 2024-05-25 ENCOUNTER — Ambulatory Visit: Admitting: Cardiovascular Disease

## 2024-06-13 DIAGNOSIS — Z6826 Body mass index (BMI) 26.0-26.9, adult: Secondary | ICD-10-CM | POA: Diagnosis not present

## 2024-06-13 DIAGNOSIS — E1165 Type 2 diabetes mellitus with hyperglycemia: Secondary | ICD-10-CM | POA: Diagnosis not present

## 2024-06-13 DIAGNOSIS — E782 Mixed hyperlipidemia: Secondary | ICD-10-CM | POA: Diagnosis not present

## 2024-06-13 DIAGNOSIS — I4891 Unspecified atrial fibrillation: Secondary | ICD-10-CM | POA: Diagnosis not present

## 2024-06-13 DIAGNOSIS — I739 Peripheral vascular disease, unspecified: Secondary | ICD-10-CM | POA: Diagnosis not present

## 2024-06-13 DIAGNOSIS — J439 Emphysema, unspecified: Secondary | ICD-10-CM | POA: Diagnosis not present

## 2024-06-13 DIAGNOSIS — Z0000 Encounter for general adult medical examination without abnormal findings: Secondary | ICD-10-CM | POA: Diagnosis not present

## 2024-06-13 DIAGNOSIS — I5032 Chronic diastolic (congestive) heart failure: Secondary | ICD-10-CM | POA: Diagnosis not present

## 2024-06-13 DIAGNOSIS — D6869 Other thrombophilia: Secondary | ICD-10-CM | POA: Diagnosis not present

## 2024-06-20 ENCOUNTER — Encounter: Payer: Self-pay | Admitting: Cardiovascular Disease

## 2024-06-20 ENCOUNTER — Ambulatory Visit: Attending: Cardiovascular Disease | Admitting: Cardiovascular Disease

## 2024-06-20 VITALS — BP 104/67 | HR 55 | Ht 73.0 in | Wt 202.0 lb

## 2024-06-20 DIAGNOSIS — I25118 Atherosclerotic heart disease of native coronary artery with other forms of angina pectoris: Secondary | ICD-10-CM

## 2024-06-20 NOTE — Progress Notes (Signed)
  Electrophysiology Office Note:    Date:  06/20/2024   ID:  [name], DOB [birth year], MRN [id number]  PCP:  Okey Carlin Redbird, MD   [facility] Providers Cardiologist:  Oneil Parchment, MD Electrophysiologist:  Eulas FORBES Furbish, MD     Referring MD: Okey Carlin Redbird, MD   History of Present Illness:    Samuel Watkins is a 78 y.o. male with a hx listed below, significant for CAD, multiple DES, PAD, DM 2, hypertension, hyperlipidemia, AAA s/p repair, atrial fibrillation, referred for arrhythmia management.  He underwent DC cardioversion in August 2023 and had early recurrence of AF.  He reports that he is very symptomatic with atrial fibrillation.  He is easily fatigued and winded.  He not enjoy doing the things he joined doing in the past due to to fatigue and shortness of breath.  He does not have chest pain, syncope, presyncope.  He underwent A-fib ablation on October 06, 2022.  He had recurrence of atrial fibrillation shortly after the procedure and, when seen in atrial fibrillation clinic on April 22, reported that he did not feel much better. He underwent cardioversion on Nov 12, 2022.  Amiodarone  was started due to recurrence of atrial fibrillation in the blinding period as well as high burden of PVCs seen on EKG. the amiodarone  was discontinued after he recovered from the procedure.  He reports that he is doing well and has no complaints today.    EKGs/Labs/Other Studies Reviewed Today:     TTE 09/2021: normal EF, LA mildly dilated  EKG:       Recent Labs: 07/23/2023: ALT 29; Magnesium 2.3 04/22/2024: BUN 22; Creatinine, Ser 1.29; Hemoglobin 11.1; Platelets 156; Potassium 4.4; Sodium 139     Physical Exam:    VS:  BP 104/67   Pulse (!) 55   Ht 6' 1 (1.854 m)   Wt 202 lb (91.6 kg)   SpO2 97%   BMI 26.65 kg/m     Wt Readings from Last 3 Encounters:  06/20/24 202 lb (91.6 kg)  05/19/24 203 lb (92.1 kg)  04/21/24 200 lb (90.7 kg)     GEN: Well  nourished, well developed in no acute distress CARDIAC: iRRR, no murmurs, rubs, gallops RESPIRATORY:  Normal work of breathing MUSCULOSKELETAL: no edema    ASSESSMENT & PLAN:    Persistent AF:  symptomatic. Failed DC cardioversion.  S/p ablation 09/2022 with early recurrence, managed temporarily with amiodarone  amiodarone  was started during the blinding period for both PVCs and AF Now that he is out of the post-ablation blinding period, he is maintaining sinus rhythm   Frequent PVCs Burden high on ECG in the past No PVCs on most recent ECGs  Secondary hypercoagulable state: continue apixaban  5  Hepatitis B  COPD        Medication Adjustments/Labs and Tests Ordered: Current medicines are reviewed at length with the patient today.  Concerns regarding medicines are outlined above.  Orders Placed This Encounter  Procedures   EKG 12-Lead   No orders of the defined types were placed in this encounter.    Signed, Eulas FORBES Furbish, MD  06/20/2024 12:12 PM     [facility]

## 2024-06-20 NOTE — Patient Instructions (Signed)
 Medication Instructions:  Your physician recommends that you continue on your current medications as directed. Please refer to the Current Medication list given to you today.  *If you need a refill on your cardiac medications before your next appointment, please call your pharmacy*  Lab Work: None ordered.  If you have labs (blood work) drawn today and your tests are completely normal, you will receive your results only by: MyChart Message (if you have MyChart) OR A paper copy in the mail If you have any lab test that is abnormal or we need to change your treatment, we will call you to review the results.  Testing/Procedures: None ordered.   Follow-Up: At [facility], you and your health needs are our priority.  As part of our continuing mission to provide you with exceptional heart care, our providers are all part of one team.  This team includes your primary Cardiologist (physician) and Advanced Practice Providers or APPs (Physician Assistants and Nurse Practitioners) who all work together to provide you with the care you need, when you need it.  Your next appointment:   12 months with Afib Clinic

## 2024-06-27 ENCOUNTER — Ambulatory Visit (HOSPITAL_BASED_OUTPATIENT_CLINIC_OR_DEPARTMENT_OTHER)
Admission: RE | Admit: 2024-06-27 | Discharge: 2024-06-27 | Disposition: A | Discharge status: Home / Self Care | Source: Ambulatory Visit | Attending: Acute Care | Admitting: Acute Care

## 2024-06-27 DIAGNOSIS — Z87891 Personal history of nicotine dependence: Secondary | ICD-10-CM | POA: Insufficient documentation

## 2024-06-27 DIAGNOSIS — Z122 Encounter for screening for malignant neoplasm of respiratory organs: Secondary | ICD-10-CM

## 2024-07-04 ENCOUNTER — Other Ambulatory Visit: Payer: Self-pay

## 2024-07-04 DIAGNOSIS — Z122 Encounter for screening for malignant neoplasm of respiratory organs: Secondary | ICD-10-CM

## 2024-07-04 DIAGNOSIS — Z87891 Personal history of nicotine dependence: Secondary | ICD-10-CM
